# Patient Record
Sex: Male | Born: 1960 | Hispanic: No | Marital: Married | Smoking: Former smoker
Health system: Southern US, Community
[De-identification: ages and names within clinical notes are randomized; demographics above are authoritative.]

## PROBLEM LIST (undated history)

## (undated) DIAGNOSIS — E118 Type 2 diabetes mellitus with unspecified complications: Secondary | ICD-10-CM

## (undated) DIAGNOSIS — N289 Disorder of kidney and ureter, unspecified: Secondary | ICD-10-CM

## (undated) DIAGNOSIS — E785 Hyperlipidemia, unspecified: Secondary | ICD-10-CM

## (undated) DIAGNOSIS — Z8659 Personal history of other mental and behavioral disorders: Secondary | ICD-10-CM

## (undated) DIAGNOSIS — E119 Type 2 diabetes mellitus without complications: Secondary | ICD-10-CM

## (undated) DIAGNOSIS — K5090 Crohn's disease, unspecified, without complications: Secondary | ICD-10-CM

## (undated) DIAGNOSIS — Z85820 Personal history of malignant melanoma of skin: Secondary | ICD-10-CM

## (undated) DIAGNOSIS — I10 Essential (primary) hypertension: Secondary | ICD-10-CM

## (undated) DIAGNOSIS — Z85828 Personal history of other malignant neoplasm of skin: Secondary | ICD-10-CM

## (undated) DIAGNOSIS — M545 Low back pain, unspecified: Secondary | ICD-10-CM

## (undated) DIAGNOSIS — H409 Unspecified glaucoma: Secondary | ICD-10-CM

## (undated) DIAGNOSIS — M1990 Unspecified osteoarthritis, unspecified site: Secondary | ICD-10-CM

## (undated) DIAGNOSIS — C439 Malignant melanoma of skin, unspecified: Secondary | ICD-10-CM

## (undated) HISTORY — DX: Personal history of malignant melanoma of skin: Z85.820

## (undated) HISTORY — DX: Hyperlipidemia, unspecified: E78.5

## (undated) HISTORY — DX: Disorder of kidney and ureter, unspecified: N28.9

## (undated) HISTORY — DX: Type 2 diabetes mellitus without complications: E11.9

## (undated) HISTORY — PX: GLAUCOMA REPAIR: SHX214

## (undated) HISTORY — DX: Personal history of other mental and behavioral disorders: Z86.59

## (undated) HISTORY — DX: Personal history of other malignant neoplasm of skin: Z85.828

## (undated) HISTORY — PX: COLONOSCOPY: SHX174

## (undated) HISTORY — PX: RETINAL LASER PROCEDURE: SHX2339

## (undated) HISTORY — DX: Unspecified osteoarthritis, unspecified site: M19.90

## (undated) HISTORY — DX: Crohn's disease, unspecified, without complications: K50.90

## (undated) HISTORY — DX: Unspecified glaucoma: H40.9

## (undated) HISTORY — DX: Essential (primary) hypertension: I10

## (undated) HISTORY — DX: Type 2 diabetes mellitus with unspecified complications: E11.8

## (undated) HISTORY — DX: Low back pain, unspecified: M54.50

## (undated) HISTORY — DX: Malignant melanoma of skin, unspecified: C43.9

## (undated) HISTORY — PX: CATARACT EXTRACTION, BILATERAL: SHX1313

---
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
## 1997-12-11 HISTORY — PX: MELANOMA EXCISION: SHX5266

## 1997-12-11 HISTORY — PX: KNEE ARTHROSCOPY: SUR90

## 2012-10-22 ENCOUNTER — Other Ambulatory Visit (HOSPITAL_COMMUNITY): Payer: Self-pay | Admitting: Neurosurgery

## 2012-10-22 DIAGNOSIS — M542 Cervicalgia: Secondary | ICD-10-CM

## 2012-10-22 DIAGNOSIS — M47812 Spondylosis without myelopathy or radiculopathy, cervical region: Secondary | ICD-10-CM

## 2012-10-22 DIAGNOSIS — M5030 Other cervical disc degeneration, unspecified cervical region: Secondary | ICD-10-CM

## 2012-10-22 DIAGNOSIS — M5412 Radiculopathy, cervical region: Secondary | ICD-10-CM

## 2012-10-28 ENCOUNTER — Ambulatory Visit (HOSPITAL_COMMUNITY)
Admission: RE | Admit: 2012-10-28 | Discharge: 2012-10-28 | Disposition: A | Discharge status: Home / Self Care | Payer: 59 | Source: Ambulatory Visit | Attending: Neurosurgery | Admitting: Neurosurgery

## 2012-10-28 DIAGNOSIS — M542 Cervicalgia: Secondary | ICD-10-CM | POA: Insufficient documentation

## 2012-10-28 DIAGNOSIS — M47812 Spondylosis without myelopathy or radiculopathy, cervical region: Secondary | ICD-10-CM | POA: Insufficient documentation

## 2012-10-28 DIAGNOSIS — M5030 Other cervical disc degeneration, unspecified cervical region: Secondary | ICD-10-CM | POA: Insufficient documentation

## 2012-10-28 DIAGNOSIS — M5412 Radiculopathy, cervical region: Secondary | ICD-10-CM | POA: Insufficient documentation

## 2015-08-27 LAB — CBC AND DIFFERENTIAL
HEMATOCRIT: 39 % — AB (ref 41–53)
Hemoglobin: 13.8 g/dL (ref 13.5–17.5)
Platelets: 232 10*3/uL (ref 150–399)
WBC: 4.5 10^3/mL

## 2015-12-08 LAB — CBC AND DIFFERENTIAL
HCT: 28 % — AB (ref 41–53)
Hemoglobin: 9.7 g/dL — AB (ref 13.5–17.5)
Platelets: 334 10*3/uL (ref 150–399)
WBC: 4 10^3/mL

## 2016-04-20 LAB — HEMOGLOBIN A1C: Hemoglobin A1C: 7.5

## 2016-09-19 MED FILL — MERCAPTOPURINE 50 MG TABLET: 50 | 90 days supply | Qty: 90 | Fill #0

## 2016-09-19 MED FILL — AMLODIPINE BESYLATE 10 MG T: 10 | 90 days supply | Qty: 90 | Fill #0 | Status: TO

## 2016-09-19 MED FILL — PIOGLITAZONE HCL 30 MG TAB: 30 | 90 days supply | Qty: 90 | Fill #0

## 2016-09-19 MED FILL — metFORMIN HCL 500 MG TABS: 500 | 90 days supply | Qty: 180 | Fill #0 | Status: TO

## 2016-09-19 MED FILL — ALLOPURINOL 100 MG TABLET: 100 | 90 days supply | Qty: 90 | Fill #0

## 2016-09-19 MED FILL — BUDESONIDE EC 3 MG CAPSULE: 3 | 30 days supply | Qty: 90 | Fill #0

## 2016-09-19 MED FILL — VENLAFAXINE HCL ER 37.5 MG: 37.5 | 90 days supply | Qty: 90 | Fill #0 | Status: TO

## 2016-09-19 MED FILL — VALSARTAN-HCTZ 320-25 MG TA: 320-25 | 90 days supply | Qty: 90 | Fill #0 | Status: TO

## 2016-11-08 MED FILL — UNIFINE PENTIPS 32GX5/32: 32G X 4 MM | 90 days supply | Qty: 100 | Fill #0 | Status: TO

## 2016-11-08 MED FILL — UNIFINE PENTIPS 32GX5/32": 32G X 4 MM | 90 days supply | Qty: 100 | Fill #0 | Status: TO

## 2016-12-06 MED FILL — TOUJEO SOLOSTAR 300 UNITS/M: 300 | 90 days supply | Qty: 9 | Fill #0 | Status: TO

## 2016-12-21 MED FILL — ALLOPURINOL 100 MG TABLET: 100 | 90 days supply | Qty: 90 | Fill #1

## 2016-12-21 MED FILL — MERCAPTOPURINE 50 MG TABLET: 50 | 90 days supply | Qty: 90 | Fill #1

## 2016-12-21 MED FILL — BUDESONIDE EC 3 MG CAPSULE: 3 | 30 days supply | Qty: 90 | Fill #1

## 2016-12-29 ENCOUNTER — Encounter: Payer: Self-pay | Admitting: Family Medicine

## 2017-01-16 MED FILL — AMLODIPINE BESYLATE 10 MG T: 10 | 90 days supply | Qty: 90 | Fill #0

## 2017-01-16 MED FILL — VENLAFAXINE HCL ER 37.5 MG: 37.5 | 90 days supply | Qty: 90 | Fill #0

## 2017-01-16 MED FILL — VALSARTAN-HCTZ 320-25 MG TA: 320-25 | 90 days supply | Qty: 90 | Fill #0

## 2017-01-16 MED FILL — metFORMIN HCL 500 MG TABS: 500 | 90 days supply | Qty: 180 | Fill #0

## 2017-01-25 ENCOUNTER — Telehealth: Payer: Self-pay

## 2017-01-25 ENCOUNTER — Ambulatory Visit: Payer: 59 | Admitting: Family Medicine

## 2017-01-26 NOTE — Telephone Encounter (Signed)
Left message for patient to return call.

## 2017-01-29 ENCOUNTER — Encounter: Payer: Self-pay | Admitting: Family Medicine

## 2017-01-29 ENCOUNTER — Ambulatory Visit (INDEPENDENT_AMBULATORY_CARE_PROVIDER_SITE_OTHER): Payer: 59 | Admitting: Family Medicine

## 2017-01-29 VITALS — BP 114/77 | HR 92 | Temp 98.1°F | Ht 73.0 in | Wt 178.8 lb

## 2017-01-29 DIAGNOSIS — E118 Type 2 diabetes mellitus with unspecified complications: Secondary | ICD-10-CM | POA: Diagnosis not present

## 2017-01-29 DIAGNOSIS — Z85828 Personal history of other malignant neoplasm of skin: Secondary | ICD-10-CM

## 2017-01-29 DIAGNOSIS — Z794 Long term (current) use of insulin: Secondary | ICD-10-CM | POA: Insufficient documentation

## 2017-01-29 DIAGNOSIS — E782 Mixed hyperlipidemia: Secondary | ICD-10-CM | POA: Diagnosis not present

## 2017-01-29 DIAGNOSIS — K5090 Crohn's disease, unspecified, without complications: Secondary | ICD-10-CM

## 2017-01-29 DIAGNOSIS — I10 Essential (primary) hypertension: Secondary | ICD-10-CM | POA: Insufficient documentation

## 2017-01-29 DIAGNOSIS — R252 Cramp and spasm: Secondary | ICD-10-CM | POA: Insufficient documentation

## 2017-01-29 DIAGNOSIS — Z1159 Encounter for screening for other viral diseases: Secondary | ICD-10-CM

## 2017-01-29 DIAGNOSIS — K5080 Crohn's disease of both small and large intestine without complications: Secondary | ICD-10-CM | POA: Insufficient documentation

## 2017-01-29 DIAGNOSIS — Z5181 Encounter for therapeutic drug level monitoring: Secondary | ICD-10-CM

## 2017-01-29 DIAGNOSIS — Z125 Encounter for screening for malignant neoplasm of prostate: Secondary | ICD-10-CM

## 2017-01-29 HISTORY — DX: Crohn's disease, unspecified, without complications: K50.90

## 2017-01-29 LAB — HEMOGLOBIN A1C: HEMOGLOBIN A1C: 6.6 % — AB (ref 4.6–6.5)

## 2017-01-29 LAB — COMPREHENSIVE METABOLIC PANEL
ALBUMIN: 4.1 g/dL (ref 3.5–5.2)
ALT: 10 U/L (ref 0–53)
AST: 15 U/L (ref 0–37)
Alkaline Phosphatase: 33 U/L — ABNORMAL LOW (ref 39–117)
BILIRUBIN TOTAL: 0.7 mg/dL (ref 0.2–1.2)
BUN: 16 mg/dL (ref 6–23)
CALCIUM: 8.7 mg/dL (ref 8.4–10.5)
CHLORIDE: 103 meq/L (ref 96–112)
CO2: 32 meq/L (ref 19–32)
Creatinine, Ser: 0.98 mg/dL (ref 0.40–1.50)
GFR: 83.52 mL/min (ref >60.00)
Glucose, Bld: 153 mg/dL — ABNORMAL HIGH (ref 70–99)
Potassium: 3.9 mEq/L (ref 3.5–5.1)
Sodium: 141 mEq/L (ref 135–145)
Total Protein: 6.3 g/dL (ref 6.0–8.3)

## 2017-01-29 LAB — CBC
HEMATOCRIT: 31.5 % — AB (ref 39.0–52.0)
HEMOGLOBIN: 11.2 g/dL — AB (ref 13.0–17.0)
MCHC: 35.4 g/dL (ref 30.0–36.0)
PLATELETS: 211 10*3/uL (ref 150.0–400.0)
RBC: 2.74 Mil/uL — ABNORMAL LOW (ref 4.22–5.81)
RDW: 16.7 % — ABNORMAL HIGH (ref 11.5–15.5)
WBC: 3.1 10*3/uL — ABNORMAL LOW (ref 4.0–10.5)

## 2017-01-29 LAB — LIPID PANEL
CHOLESTEROL: 178 mg/dL (ref 0–200)
HDL: 38.1 mg/dL — AB (ref >39.00)
LDL Cholesterol: 116 mg/dL — ABNORMAL HIGH (ref 0–99)
NonHDL: 140.26
Total CHOL/HDL Ratio: 5
Triglycerides: 119 mg/dL (ref 0.0–149.0)
VLDL: 23.8 mg/dL (ref 0.0–40.0)

## 2017-01-29 LAB — PSA: PSA: 1.05 ng/mL (ref 0.10–4.00)

## 2017-01-29 MED ORDER — MERCAPTOPURINE 50 MG PO TABS: 50.0000 mg | ORAL_TABLET | Freq: Every day | ORAL | 3 refills | Status: DC

## 2017-01-29 MED ORDER — ALLOPURINOL 100 MG PO TABS: 100.0000 mg | ORAL_TABLET | Freq: Every day | ORAL | 3 refills | Status: DC

## 2017-01-29 MED ORDER — METFORMIN HCL 500 MG PO TABS: 500.0000 mg | ORAL_TABLET | Freq: Two times a day (BID) | ORAL | 3 refills | Status: DC

## 2017-01-29 MED ORDER — BUDESONIDE 3 MG PO CPEP: ORAL_CAPSULE | ORAL | 3 refills | Status: DC

## 2017-01-29 MED ORDER — VALSARTAN-HYDROCHLOROTHIAZIDE 320-25 MG PO TABS: 1.0000 | ORAL_TABLET | Freq: Every day | ORAL | 3 refills | Status: DC

## 2017-01-29 MED ORDER — AMLODIPINE-VALSARTAN-HCTZ 10-320-25 MG PO TABS: 1.0000 | ORAL_TABLET | Freq: Every day | ORAL | 3 refills | Status: DC

## 2017-01-29 MED ORDER — VENLAFAXINE HCL ER 37.5 MG PO CP24: 37.5000 mg | ORAL_CAPSULE | Freq: Every day | ORAL | 3 refills | Status: DC

## 2017-01-29 MED ORDER — EZETIMIBE 10 MG PO TABS: 10.0000 mg | ORAL_TABLET | Freq: Every day | ORAL | 3 refills | Status: DC

## 2017-01-29 MED ORDER — INSULIN GLARGINE 300 UNIT/ML ~~LOC~~ SOPN: 30.0000 [IU] | PEN_INJECTOR | Freq: Every day | SUBCUTANEOUS | 3 refills | Status: DC

## 2017-01-29 MED ORDER — AMLODIPINE BESYLATE 10 MG PO TABS: 10.0000 mg | ORAL_TABLET | Freq: Every day | ORAL | 3 refills | Status: DC

## 2017-01-29 MED ORDER — PIOGLITAZONE HCL 30 MG PO TABS: 30.0000 mg | ORAL_TABLET | Freq: Every day | ORAL | 3 refills | Status: DC

## 2017-01-29 MED FILL — EZETIMIBE 10 MG TABLET: 10 | 90 days supply | Qty: 90 | Fill #0

## 2017-01-29 NOTE — Patient Instructions (Signed)
It was a pleasure to meet you today- welcome back to [place]!   I will be in touch with your labs and we will re-request your records from [place] I have referred you to Dr. Fontaine No at [facility] derm. Let me know if you need any other referrals and we can plan our next visit based on your labs (probably 4-6 months to recheck diabetes)

## 2017-01-29 NOTE — Progress Notes (Signed)
Pre visit review using our clinic review tool, if applicable. No additional management support is needed unless otherwise documented below in the visit note. 

## 2017-01-29 NOTE — Progress Notes (Signed)
[place] at [facility]: Thomas Peterson [address] [place], [place] [address] [phone]  Date:  01/29/2017   Name:  Thomas Peterson   DOB:  August 02, 1961   MRN:  [id number]  PCP:  Thomas Blinks, MD    Chief Complaint: Establish Care (Pt is new to the area. Pt here to est care. )   History of Present Illness:  Thomas [place] is a 56 y.o. very pleasant male patient who presents with the following:  Recently moved here from [facility].  Moved here for his job.  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    He is missing [place]- but is happy to be back here doing a job he enjoys.    History of Crohn's disease- he will be seeing Thomas [facility] at [facility] for his GI service He does have DM- his A1c has been running approx 7%.  Most recent A1c about 6 months ago  Last cholesterol was approx 6 months ago as well He is out of zetia- out of this for a while, he forgot about this medication while in the move  May have been out of it for a couple of months or more so we expect his lipids will be up today  No breakfast yet today His Crohns' disease has been flared up over the last several months- he is stuck taking the budesonide really every day.  He is taking about 3 mg a day right now at the least.  His sx may be fatigue, abd pain, loss of appetite.  He does have a GI appt next month but does not remember the exact date He does have some diarrhea but this is not a main feature  He does have a history of leg cramps- these are better with magnesium.  He does not tolerate statins and as such is on zetia instead  He does have significant cervical spine disease- and he thinks also lower back diease- did have an MRI years ago per Thomas [place]- 2013  IMPRESSION: 1.  Chronic C5-C6 and C6-C7 disc degeneration with mild spinal stenosis and moderate to severe bilateral C6 and C7 foraminal stenosis.  No spinal cord signal abnormality.  2.   Chronic severe facet degeneration at C7-T1 plus uncovertebral hypertrophy and possible  superimposed left foraminal disc protrusion resulting in severe left and moderate right C8 foraminal stenosis.  He also has a history of skin cancer and does need to establish with derm locally- he has had melanoma, [place] and BCC  His is married, his wife is in excellent health.   Their daughter is at [facility]- she has raynaud's disease but is otherwise healthy  He enjoys scuba diving but does not get to go very often.    He has had a flu shot this year He is s/p hep C testing and was negative   We have requested his records from [place] again for himj  Patient Active Problem List   Diagnosis Date Noted  . Controlled type 2 diabetes mellitus with complication, with long-term current use of insulin ([place]) 01/29/2017  . Essential hypertension 01/29/2017  . Crohn's disease without complication ([place]) 67/89/3810  . Mixed hyperlipidemia 01/29/2017  . Leg cramps 01/29/2017  . Medication monitoring encounter 01/29/2017  . History of skin cancer 01/29/2017    Past Medical History:  Diagnosis Date  . Crohn's disease without complication ([place]) 1/75/1025    No past surgical history on file.  Social History  Substance Use Topics  . Smoking status: Not on file  . Smokeless tobacco: Not on file  . Alcohol use Not on file    No family history on file.  Allergies  Allergen Reactions  . Sulfa Antibiotics Anaphylaxis    "I got these purple spots everywhere"   . Flagyl [Metronidazole]     GI effects   . Statins Other (See Comments)    Leg cramps    Medication list has been reviewed and updated.  No current outpatient prescriptions on file prior to visit.   No current facility-administered medications on file prior to visit.     Review of Systems:  As per HPI- otherwise negative.   Physical Examination: Vitals:   01/29/17 0834  BP: 114/77  Pulse: 92  Temp: 98.1 F (36.7 C)   Vitals:    01/29/17 0834  Weight: 178 lb 12.8 oz (81.1 kg)  Height: 6' 1"  (1.854 m)   Body mass index is 23.59 kg/m. Ideal Body Weight: Weight in (lb) to have BMI = 25: 189.1  GEN: WDWN, NAD, Non-toxic, A & O x 3, normal weight, looks well HEENT: Atraumatic, Normocephalic. Neck supple. No masses, No LAD. Ears and Nose: No external deformity. CV: RRR, No M/G/R. No JVD. No thrill. No extra heart sounds. PULM: CTA B, no wheezes, crackles, rhonchi. No retractions. No resp. distress. No accessory muscle use. ABD: S, NT, ND EXTR: No c/c/e NEURO Normal gait.  PSYCH: Normally interactive. Conversant. Not depressed or anxious appearing.  Calm demeanor.    Assessment and Plan: Controlled type 2 diabetes mellitus with complication, with long-term current use of insulin ([place]) - Plan: Comprehensive metabolic panel, Hemoglobin A1c, metFORMIN (GLUCOPHAGE) 500 MG tablet, pioglitazone (ACTOS) 30 MG tablet, Insulin Glargine (TOUJEO SOLOSTAR) 300 UNIT/ML SOPN  Essential hypertension - Plan: Comprehensive metabolic panel, amLODipine (NORVASC) 10 MG tablet, valsartan-hydrochlorothiazide (DIOVAN-HCT) 320-25 MG tablet, DISCONTINUED: Amlodipine-Valsartan-HCTZ 10-320-25 MG TABS  Crohn's disease without complication, unspecified gastrointestinal tract location ([place]) - Plan: CBC, Comprehensive metabolic panel, allopurinol (ZYLOPRIM) 100 MG tablet, mercaptopurine (PURINETHOL) 50 MG tablet, venlafaxine XR (EFFEXOR-XR) 37.5 MG 24 hr capsule, budesonide (ENTOCORT EC) 3 MG 24 hr capsule  Mixed hyperlipidemia - Plan: Lipid panel, ezetimibe (ZETIA) 10 MG tablet  Leg cramps - Plan: Comprehensive metabolic panel  Medication monitoring encounter - Plan: Comprehensive metabolic panel  Screening for prostate cancer - Plan: PSA  Encounter for hepatitis C screening test for low risk patient  History of skin cancer - Plan: Ambulatory referral to Dermatology  Here today to establish care and go over his chronic heath issues as  above BP is well controlled, refilled his meds Check A1c, refiled DM meds Refilled his crohn's disease meds, he has referral to GI pending See patient instructions for more details.     mychart to pt: Your A1c looks fine.  Your total to HDL cholesterol ratio is not great, but as you have been off your zetia for a while I would just start back on this med and we can recheck in a few months Your PSA is normal- will await old labs for comparison Metabolic profile looks fine with the exception of high glucose  Your blood count is somewhat unusual- however this may be your baseline, have you ever been told that you were anemic or that your mean cell volume (average red cell size) was high? If this is all news to you I would recommend that we repeat your blood count and also run B12 and  folate levels for you soon (it is possible that you could be deficient in these vitamin due to your Crohn's disease). There is nothing acutely worrisome in your blood count, but we will want to follow-up.  I will hope to have your old records soon  Results for orders placed or performed in visit on 01/29/17  CBC  Result Value Ref Range   WBC 3.1 (L) 4.0 - 10.5 K/uL   RBC 2.74 (L) 4.22 - 5.81 Mil/uL   Platelets 211.0 150.0 - 400.0 K/uL   Hemoglobin 11.2 (L) 13.0 - 17.0 g/dL   HCT 31.5 (L) 39.0 - 52.0 %   MCV 115.3 Repeated and verified X2. (H) 78.0 - 100.0 fl   MCHC 35.4 30.0 - 36.0 g/dL   RDW 16.7 (H) 11.5 - 15.5 %  Comprehensive metabolic panel  Result Value Ref Range   Sodium 141 135 - 145 mEq/L   Potassium 3.9 3.5 - 5.1 mEq/L   Chloride 103 96 - 112 mEq/L   CO2 32 19 - 32 mEq/L   Glucose, Bld 153 (H) 70 - 99 mg/dL   BUN 16 6 - 23 mg/dL   Creatinine, Ser 0.98 0.40 - 1.50 mg/dL   Total Bilirubin 0.7 0.2 - 1.2 mg/dL   Alkaline Phosphatase 33 (L) 39 - 117 U/L   AST 15 0 - 37 U/L   ALT 10 0 - 53 U/L   Total Protein 6.3 6.0 - 8.3 g/dL   Albumin 4.1 3.5 - 5.2 g/dL   Calcium 8.7 8.4 - 10.5 mg/dL   GFR  83.52 >60.00 mL/min  PSA  Result Value Ref Range   PSA 1.05 0.10 - 4.00 ng/mL  Hemoglobin A1c  Result Value Ref Range   Hgb A1c MFr Bld 6.6 (H) 4.6 - 6.5 %  Lipid panel  Result Value Ref Range   Cholesterol 178 0 - 200 mg/dL   Triglycerides 119.0 0.0 - 149.0 mg/dL   HDL 38.10 (L) >39.00 mg/dL   VLDL 23.8 0.0 - 40.0 mg/dL   LDL Cholesterol 116 (H) 0 - 99 mg/dL   Total CHOL/HDL Ratio 5    NonHDL 140.26      Signed Thomas Blinks, MD

## 2017-02-04 ENCOUNTER — Encounter: Payer: Self-pay | Admitting: Family Medicine

## 2017-02-04 ENCOUNTER — Other Ambulatory Visit: Payer: Self-pay | Admitting: Family Medicine

## 2017-02-04 DIAGNOSIS — D7589 Other specified diseases of blood and blood-forming organs: Secondary | ICD-10-CM

## 2017-02-07 MED FILL — PIOGLITAZONE HCL 30 MG TAB: 30 | 90 days supply | Qty: 90 | Fill #0

## 2017-02-16 ENCOUNTER — Encounter: Payer: Self-pay | Admitting: Family Medicine

## 2017-02-16 NOTE — Progress Notes (Unsigned)
Pre visit review using our clinic review tool, if applicable. No additional management support is needed unless otherwise documented below in the visit note. 

## 2017-02-23 DIAGNOSIS — Z79899 Other long term (current) drug therapy: Secondary | ICD-10-CM | POA: Diagnosis not present

## 2017-02-23 DIAGNOSIS — K5080 Crohn's disease of both small and large intestine without complications: Secondary | ICD-10-CM | POA: Diagnosis not present

## 2017-02-23 DIAGNOSIS — R1032 Left lower quadrant pain: Secondary | ICD-10-CM | POA: Diagnosis not present

## 2017-02-23 DIAGNOSIS — M549 Dorsalgia, unspecified: Secondary | ICD-10-CM | POA: Diagnosis not present

## 2017-02-23 DIAGNOSIS — R531 Weakness: Secondary | ICD-10-CM | POA: Diagnosis not present

## 2017-02-23 DIAGNOSIS — R200 Anesthesia of skin: Secondary | ICD-10-CM | POA: Diagnosis not present

## 2017-02-26 LAB — VITAMIN D 25 HYDROXY (VIT D DEFICIENCY, FRACTURES): VIT D 25 HYDROXY: 36

## 2017-02-28 MED FILL — ULTICARE PEN NDL 4MM 32G: 32G X 4 MM | 90 days supply | Qty: 100 | Fill #0

## 2017-03-01 ENCOUNTER — Encounter: Payer: Self-pay | Admitting: Family Medicine

## 2017-03-06 ENCOUNTER — Encounter: Payer: Self-pay | Admitting: Family Medicine

## 2017-03-06 NOTE — Progress Notes (Unsigned)
HEPATITIS B SURFACE ANTIGEN : NON-REACTIVE  SED RATE: 40 MM/HR  IRON: 88 mcg/dL  FERRITIN: 173 NG/ML  TIBC: 288 mcg/dL

## 2017-03-21 MED FILL — MERCAPTOPURINE 50 MG TABLET: 50 | 90 days supply | Qty: 90 | Fill #0

## 2017-03-21 MED FILL — BUDESONIDE EC 3 MG CAPSULE: 3 | 60 days supply | Qty: 180 | Fill #0

## 2017-03-21 MED FILL — ALLOPURINOL 100 MG TABLET: 100 | 90 days supply | Qty: 90 | Fill #0

## 2017-03-21 MED FILL — TOUJEO SOLOSTAR 300 UNITS/M: 300 | 90 days supply | Qty: 9 | Fill #0

## 2017-04-30 MED FILL — VALSARTAN-HCTZ 320-25 MG TA: 320-25 | 90 days supply | Qty: 90 | Fill #0

## 2017-04-30 MED FILL — AMLODIPINE BESYLATE 10 MG T: 10 | 90 days supply | Qty: 90 | Fill #0

## 2017-04-30 MED FILL — PIOGLITAZONE HCL 30 MG TAB: 30 | 90 days supply | Qty: 90 | Fill #1

## 2017-04-30 MED FILL — VENLAFAXINE HCL ER 37.5 MG: 37.5 | 90 days supply | Qty: 90 | Fill #0

## 2017-04-30 MED FILL — metFORMIN HCL 500 MG TABS: 500 | 90 days supply | Qty: 180 | Fill #0

## 2017-05-29 DIAGNOSIS — Z85828 Personal history of other malignant neoplasm of skin: Secondary | ICD-10-CM | POA: Diagnosis not present

## 2017-05-29 DIAGNOSIS — D2271 Melanocytic nevi of right lower limb, including hip: Secondary | ICD-10-CM | POA: Diagnosis not present

## 2017-05-29 DIAGNOSIS — L738 Other specified follicular disorders: Secondary | ICD-10-CM | POA: Diagnosis not present

## 2017-05-29 DIAGNOSIS — L573 Poikiloderma of Civatte: Secondary | ICD-10-CM | POA: Diagnosis not present

## 2017-05-29 DIAGNOSIS — L814 Other melanin hyperpigmentation: Secondary | ICD-10-CM | POA: Diagnosis not present

## 2017-05-29 DIAGNOSIS — L821 Other seborrheic keratosis: Secondary | ICD-10-CM | POA: Diagnosis not present

## 2017-05-29 DIAGNOSIS — L570 Actinic keratosis: Secondary | ICD-10-CM | POA: Diagnosis not present

## 2017-05-29 DIAGNOSIS — Z85820 Personal history of malignant melanoma of skin: Secondary | ICD-10-CM | POA: Diagnosis not present

## 2017-05-29 DIAGNOSIS — D1801 Hemangioma of skin and subcutaneous tissue: Secondary | ICD-10-CM | POA: Diagnosis not present

## 2017-05-31 DIAGNOSIS — D72819 Decreased white blood cell count, unspecified: Secondary | ICD-10-CM | POA: Diagnosis not present

## 2017-05-31 DIAGNOSIS — Z79899 Other long term (current) drug therapy: Secondary | ICD-10-CM | POA: Diagnosis not present

## 2017-05-31 DIAGNOSIS — D649 Anemia, unspecified: Secondary | ICD-10-CM | POA: Diagnosis not present

## 2017-05-31 DIAGNOSIS — Z87891 Personal history of nicotine dependence: Secondary | ICD-10-CM | POA: Diagnosis not present

## 2017-05-31 DIAGNOSIS — Z888 Allergy status to other drugs, medicaments and biological substances status: Secondary | ICD-10-CM | POA: Diagnosis not present

## 2017-05-31 DIAGNOSIS — Z882 Allergy status to sulfonamides status: Secondary | ICD-10-CM | POA: Diagnosis not present

## 2017-05-31 DIAGNOSIS — Z289 Immunization not carried out for unspecified reason: Secondary | ICD-10-CM | POA: Diagnosis not present

## 2017-05-31 DIAGNOSIS — K589 Irritable bowel syndrome without diarrhea: Secondary | ICD-10-CM | POA: Diagnosis not present

## 2017-05-31 DIAGNOSIS — K5080 Crohn's disease of both small and large intestine without complications: Secondary | ICD-10-CM | POA: Diagnosis not present

## 2017-06-04 MED FILL — EZETIMIBE 10 MG TABLET: 10 | 90 days supply | Qty: 90 | Fill #1

## 2017-06-05 ENCOUNTER — Telehealth: Payer: Self-pay | Admitting: Pharmacist

## 2017-06-05 NOTE — Telephone Encounter (Signed)
Called patient to schedule an appointment for the [facility]. Provided information on the clinic and he agrees to participate in program. He is unsure of his schedule at this time and will call back for appt.

## 2017-06-06 MED FILL — FLUOROURACIL 5% CREAM: 5 | 14 days supply | Qty: 40 | Fill #0

## 2017-06-07 ENCOUNTER — Ambulatory Visit (HOSPITAL_BASED_OUTPATIENT_CLINIC_OR_DEPARTMENT_OTHER): Payer: Self-pay | Admitting: Pharmacist

## 2017-06-07 DIAGNOSIS — Z79899 Other long term (current) drug therapy: Secondary | ICD-10-CM

## 2017-06-07 MED ORDER — ADALIMUMAB 40 MG/0.8ML ~~LOC~~ AJKT: 40.0000 mg | AUTO-INJECTOR | SUBCUTANEOUS | 5 refills | Status: DC

## 2017-06-07 NOTE — Progress Notes (Signed)
   S: Patient presents to [facility] for review of their specialty medication therapy.  Patient is currently taking Humira for Crohn's disease. Patient is managed by Eduard Roux for this.   He has been on Remicade in the past but failed it possibly due to incorrect dosing. Just was on 6-MP and was failing.  Adherence: hasn't started yet  Efficacy: hasn't started yet  Dosing:  Crohn disease: SubQ (may continue aminosalicylates and/or corticosteroids; if necessary, azathioprine, mercaptopurine, or methotrexate may also be continued): Initial: 160 mg (given as four 40 mg injections on day 1 or given as two 40 mg injections per day over 2 consecutive days), then 80 mg 2 weeks later (day 15). Maintenance: 40 mg every other week beginning day 29. Note: Some patients may require 40 mg every week as maintenance therapy Karl Pock [id number]).   Dose adjustments: Renal: no dose adjustments (has not been studied) Hepatic: no dose adjustments (has not been studied)  Drug-drug interactions: none  Screening: TB test: completed per patient Hepatitis: completed per patient  Monitoring: S/sx of infection: denies CBC: see below, monitored q3 months S/sx of hypersensitivity: has not started yet S/sx of malignancy: denies S/sx of heart failure: denies  O:     Lab Results  Component Value Date   WBC 3.1 (L) 01/29/2017   HGB 11.2 (L) 01/29/2017   HCT 31.5 (L) 01/29/2017   MCV 115.3 Repeated and verified X2. (H) 01/29/2017   PLT 211.0 01/29/2017      Chemistry      Component Value Date/Time   NA 141 01/29/2017 0911   K 3.9 01/29/2017 0911   CL 103 01/29/2017 0911   CO2 32 01/29/2017 0911   BUN 16 01/29/2017 0911   CREATININE 0.98 01/29/2017 0911      Component Value Date/Time   CALCIUM 8.7 01/29/2017 0911   ALKPHOS 33 (L) 01/29/2017 0911   AST 15 01/29/2017 0911   ALT 10 01/29/2017 0911   BILITOT 0.7 01/29/2017 0911       A/P: 1. Medication  review: Patient currently on Humira for Crohn's disease. Reviewed the medication with the patient, including the following: Humira is a TNF blocking agent indicated for ankylosing spondylitis, Crohn's disease, Hidradenitis suppurativa, psoriatic arthritis, plaque psoriasis, ulcerative colitis, and uveitis. The most common adverse effects are infections, headache, and injection site reactions. There is the possibility of an increased risk of malignancy but it is not well understood if this increased risk is due to there medication or the disease state. There are rare cases of pancytopenia and aplastic anemia. No recommendations for any changes. Patient needs to continue to have q3 month CBCs for monitoring.   Nicoletta Ba, PharmD, BCPS, BCACP, [facility] [phone]

## 2017-06-11 ENCOUNTER — Other Ambulatory Visit: Payer: Self-pay | Admitting: Pharmacist

## 2017-06-11 MED ORDER — ADALIMUMAB 40 MG/0.8ML ~~LOC~~ AJKT: 160.0000 mg | AUTO-INJECTOR | Freq: Once | SUBCUTANEOUS | 0 refills | Status: DC

## 2017-06-12 ENCOUNTER — Other Ambulatory Visit: Payer: Self-pay | Admitting: Pharmacist

## 2017-06-12 MED ORDER — ADALIMUMAB 40 MG/0.8ML ~~LOC~~ AJKT: 160.0000 mg | AUTO-INJECTOR | Freq: Once | SUBCUTANEOUS | 0 refills | Status: DC

## 2017-06-12 MED FILL — HUMIRA PEN 40 MG/0.8ML PNKT: 40 | 28 days supply | Qty: 6 | Fill #0

## 2017-06-26 DIAGNOSIS — K5080 Crohn's disease of both small and large intestine without complications: Secondary | ICD-10-CM | POA: Diagnosis not present

## 2017-06-26 MED FILL — ULTICARE PEN NDL 4MM 32G: 32G X 4 MM | 90 days supply | Qty: 100 | Fill #1

## 2017-06-27 MED FILL — TOUJEO SOLOSTAR 300 UNITS/M: 300 | 90 days supply | Qty: 9 | Fill #0

## 2017-07-09 DIAGNOSIS — H35722 Serous detachment of retinal pigment epithelium, left eye: Secondary | ICD-10-CM | POA: Diagnosis not present

## 2017-07-09 DIAGNOSIS — H43812 Vitreous degeneration, left eye: Secondary | ICD-10-CM | POA: Diagnosis not present

## 2017-07-11 DIAGNOSIS — E119 Type 2 diabetes mellitus without complications: Secondary | ICD-10-CM | POA: Diagnosis not present

## 2017-07-11 DIAGNOSIS — H16223 Keratoconjunctivitis sicca, not specified as Sjogren's, bilateral: Secondary | ICD-10-CM | POA: Diagnosis not present

## 2017-07-11 DIAGNOSIS — H25013 Cortical age-related cataract, bilateral: Secondary | ICD-10-CM | POA: Diagnosis not present

## 2017-07-11 DIAGNOSIS — H5213 Myopia, bilateral: Secondary | ICD-10-CM | POA: Diagnosis not present

## 2017-07-11 MED FILL — HUMIRA PEN 40 MG/0.8ML PNKT: 40 | 28 days supply | Qty: 2 | Fill #0

## 2017-08-07 MED FILL — HUMIRA PEN 40 MG/0.8ML PNKT: 40 | 28 days supply | Qty: 2 | Fill #1

## 2017-08-07 MED FILL — metFORMIN HCL 500 MG TABS: 500 | 90 days supply | Qty: 180 | Fill #1

## 2017-08-07 MED FILL — PIOGLITAZONE HCL 30 MG TAB: 30 | 90 days supply | Qty: 90 | Fill #2

## 2017-08-16 ENCOUNTER — Encounter: Payer: Self-pay | Admitting: Family Medicine

## 2017-08-16 ENCOUNTER — Ambulatory Visit (INDEPENDENT_AMBULATORY_CARE_PROVIDER_SITE_OTHER): Payer: 59 | Admitting: Family Medicine

## 2017-08-16 VITALS — BP 130/84 | HR 88 | Temp 98.1°F | Ht 73.0 in | Wt 185.4 lb

## 2017-08-16 DIAGNOSIS — Z794 Long term (current) use of insulin: Secondary | ICD-10-CM

## 2017-08-16 DIAGNOSIS — I10 Essential (primary) hypertension: Secondary | ICD-10-CM

## 2017-08-16 DIAGNOSIS — K5090 Crohn's disease, unspecified, without complications: Secondary | ICD-10-CM | POA: Diagnosis not present

## 2017-08-16 DIAGNOSIS — Z23 Encounter for immunization: Secondary | ICD-10-CM

## 2017-08-16 DIAGNOSIS — R7989 Other specified abnormal findings of blood chemistry: Secondary | ICD-10-CM

## 2017-08-16 DIAGNOSIS — E118 Type 2 diabetes mellitus with unspecified complications: Secondary | ICD-10-CM

## 2017-08-16 DIAGNOSIS — R21 Rash and other nonspecific skin eruption: Secondary | ICD-10-CM | POA: Diagnosis not present

## 2017-08-16 LAB — BASIC METABOLIC PANEL
BUN: 17 mg/dL (ref 6–23)
CALCIUM: 9.3 mg/dL (ref 8.4–10.5)
CO2: 29 mEq/L (ref 19–32)
CREATININE: 1.04 mg/dL (ref 0.40–1.50)
Chloride: 101 mEq/L (ref 96–112)
GFR: 77.83 mL/min (ref >60.00)
Glucose, Bld: 104 mg/dL — ABNORMAL HIGH (ref 70–99)
Potassium: 3.8 mEq/L (ref 3.5–5.1)
SODIUM: 140 meq/L (ref 135–145)

## 2017-08-16 LAB — HEMOGLOBIN A1C: HEMOGLOBIN A1C: 6 % (ref 4.6–6.5)

## 2017-08-16 LAB — CBC
HCT: 42.2 % (ref 39.0–52.0)
Hemoglobin: 14.5 g/dL (ref 13.0–17.0)
MCHC: 34.3 g/dL (ref 30.0–36.0)
MCV: 105.1 fl — ABNORMAL HIGH (ref 78.0–100.0)
PLATELETS: 216 10*3/uL (ref 150.0–400.0)
RBC: 4.01 Mil/uL — AB (ref 4.22–5.81)
RDW: 14.1 % (ref 11.5–15.5)
WBC: 4.7 10*3/uL (ref 4.0–10.5)

## 2017-08-16 NOTE — Patient Instructions (Signed)
It was a pleasure to see you as always!  Take care and have a wonderful trip to [facility] I will be in touch with your labs Please let me know if any change or worsening of your rash, or if it does not resolve in the next couple of weeks

## 2017-08-16 NOTE — Progress Notes (Addendum)
[place] at [facility] [address] [place], [place] [address] [phone]  Date:  08/16/2017   Name:  Thomas Peterson   DOB:  01/21/1961   MRN:  [id number]  PCP:  Darreld Mclean, MD    Chief Complaint: Follow-up (Pt states that he's doing well today. Coming in for a follow up); Diabetes (Doesn't know how his range has been. Isn't checking his BG levels regularly); and Allergic Reaction (States he's taking zyrtec for an allergy "to something" not sure why or what. States he itches and gets "bumpy")   History of Present Illness:  Thomas Peterson is a 57 y.o. very pleasant male patient who presents with the following:  I last saw him in February for an office visit.  History of controlled DM, Crohn's disease, hyperlipidemia, HTN  Recently moved here from [facility].  Moved here for his job.  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    He is missing [place]- but is happy to be back here doing a job he enjoys.    History of Crohn's disease- he will be seeing Dr. Nevada Crane at [facility] for his GI service He does have DM- his A1c has been running approx 7%.  Most recent A1c about 6 months ago  Last cholesterol was approx 6 months ago as well He is out of zetia- out of this for a while, he forgot about this medication while in the move  May have been out of it for a couple of months or more so we expect his lipids will be up today  No breakfast yet today His Crohns' disease has been flared up over the last several months- he is stuck taking the budesonide really every day.  He is taking about 3 mg a day right now at the least.  His sx may be fatigue, abd pain, loss of appetite.  He does have a GI appt next month but does not remember the exact date He does have some diarrhea but this is not a main feature He does have a history of leg cramps- these are better with magnesium.  He does not tolerate statins and as such is  on zetia instead He does have significant cervical spine disease- and he thinks also lower back diease- did have an MRI years ago per Dr. Rita Ohara- 2013  He is seeing Dr. Nyoka Cowden at [facility]- they have started him on Humira as of the last couple of months and this is really working well for him, he is feeling a lot better He is due for an A1c, foot exam, eye exam Flu shot today  A couple of weeks ago he noted a rash on his trunk, shoulders, arms.  Quite itchy. Zyrtec does help- the rash seems to be nearly gone,but he is still taking zyrtec to keep it under control No oral lesions or SOB The rash appeared in between doses of Humira, and repeating his dose last week did not make him worse.  Does not seem to be related to Humira use  He is often out in the garden working but generally does not have any plant rashes.  They do have a cat now but he has never been allergic in the past  His GI doctor is following his CBC- they hope that this will improve since he is no longer on budesonide  He is taking 30 units of toujeo, actos and metformin No  issues with low sugars Recent A1c looked fine Repeat today  Lab Results  Component Value Date   HGBA1C 6.6 (H) 01/29/2017   Wt Readings from Last 3 Encounters:  08/16/17 185 lb 6.4 oz (84.1 kg)  01/29/17 178 lb 12.8 oz (81.1 kg)   BP Readings from Last 3 Encounters:  08/16/17 130/84  01/29/17 114/77      Patient Active Problem List   Diagnosis Date Noted  . Controlled type 2 diabetes mellitus with complication, with long-term current use of insulin ([place]) 01/29/2017  . Essential hypertension 01/29/2017  . Crohn's disease without complication ([place]) 97/41/6384  . Mixed hyperlipidemia 01/29/2017  . Leg cramps 01/29/2017  . Medication monitoring encounter 01/29/2017  . History of skin cancer 01/29/2017    Past Medical History:  Diagnosis Date  . Crohn disease ([place])   . Crohn's disease without complication ([place]) 5/36/4680  . Diabetes mellitus  without complication ([place])     History reviewed. No pertinent surgical history.  Social History  Substance Use Topics  . Smoking status: Never Smoker  . Smokeless tobacco: Never Used  . Alcohol use Not on file    History reviewed. No pertinent family history.  Allergies  Allergen Reactions  . Sulfa Antibiotics Anaphylaxis    "I got these purple spots everywhere"   . Ciprofloxacin   . Flagyl [Metronidazole]     GI effects   . Statins Other (See Comments)    Leg cramps    Medication list has been reviewed and updated.  Current Outpatient Prescriptions on File Prior to Visit  Medication Sig Dispense Refill  . Adalimumab (HUMIRA PEN) 40 MG/0.8ML PNKT Inject 40 mg into the skin every 14 (fourteen) days. 2 each 5  . Adalimumab (HUMIRA PEN) 40 MG/0.8ML PNKT Inject 160 mg into the skin once. On day 1. On day 15, administer 80 mg (2 x 40 mg pen injections) 6 each 0  . allopurinol (ZYLOPRIM) 100 MG tablet Take 1 tablet (100 mg total) by mouth daily. 90 tablet 3  . amLODipine (NORVASC) 10 MG tablet Take 1 tablet (10 mg total) by mouth daily. 90 tablet 3  . budesonide (ENTOCORT EC) 3 MG 24 hr capsule Take 1- 3 daily as needed for Crohn's symptoms 180 capsule 3  . ezetimibe (ZETIA) 10 MG tablet Take 1 tablet (10 mg total) by mouth daily. 90 tablet 3  . Insulin Glargine (TOUJEO SOLOSTAR) 300 UNIT/ML SOPN Inject 30 Units into the skin daily. 5 pen 3  . mercaptopurine (PURINETHOL) 50 MG tablet Take 1 tablet (50 mg total) by mouth daily. Give on an empty stomach 1 hour before or 2 hours after meals. Caution: Chemotherapy. 90 tablet 3  . metFORMIN (GLUCOPHAGE) 500 MG tablet Take 1 tablet (500 mg total) by mouth 2 (two) times daily with a meal. 180 tablet 3  . pioglitazone (ACTOS) 30 MG tablet Take 1 tablet (30 mg total) by mouth daily. 90 tablet 3  . valsartan-hydrochlorothiazide (DIOVAN-HCT) 320-25 MG tablet Take 1 tablet by mouth daily. 90 tablet 3  . venlafaxine XR (EFFEXOR-XR) 37.5 MG 24 hr  capsule Take 1 capsule (37.5 mg total) by mouth daily with breakfast. 90 capsule 3   No current facility-administered medications on file prior to visit.     Review of Systems:  As per HPI- otherwise negative.   Physical Examination: Vitals:   08/16/17 0858  BP: 130/84  Pulse: 88  Temp: 98.1 F (36.7 C)  SpO2: 98%   Vitals:   08/16/17 0858  Weight: 185 lb 6.4 oz (84.1 kg)  Height: 6' 1"  (1.854 m)   Body mass index is 24.46 kg/m. Ideal Body Weight: Weight in (lb) to have BMI = 25: 189.1  GEN: WDWN, NAD, Non-toxic, A & O x 3, looks well, normal weight HEENT: Atraumatic, Normocephalic. Neck supple. No masses, No LAD.  Bilateral TM wnl, oropharynx normal.  PEERL,EOMI.   Ears and Nose: No external deformity. CV: RRR, No M/G/R. No JVD. No thrill. No extra heart sounds. PULM: CTA B, no wheezes, crackles, rhonchi. No retractions. No resp. distress. No accessory muscle use. ABD: S, NT, ND. No rebound. No HSM. EXTR: No c/c/e NEURO Normal gait.  PSYCH: Normally interactive. Conversant. Not depressed or anxious appearing.  Calm demeanor.  No rash visible at this time  Assessment and Plan: Controlled type 2 diabetes mellitus with complication, with long-term current use of insulin ([place]) - Plan: Hemoglobin R4Y, Basic metabolic panel  Need for influenza vaccination - Plan: Flu Vaccine QUAD 6+ mos PF IM (Fluarix Quad PF)  Essential hypertension  Crohn's disease without complication, unspecified gastrointestinal tract location (HCC)  Rash  Abnormal CBC - Plan: CBC  Here today for a follow-up visit Labs pending as above A1c today Flu shot given Discussed his rash- uncertain etiology. It does not seem to be serious to him, it is controlled with zyrtec. He will let me know if this does not resolve  BP is controlled   Signed Lamar Blinks, MD  Received labs  Results for orders placed or performed in visit on 08/16/17  Hemoglobin A1c  Result Value Ref Range   Hgb A1c  MFr Bld 6.0 4.6 - 6.5 %  Basic metabolic panel  Result Value Ref Range   Sodium 140 135 - 145 mEq/L   Potassium 3.8 3.5 - 5.1 mEq/L   Chloride 101 96 - 112 mEq/L   CO2 29 19 - 32 mEq/L   Glucose, Bld 104 (H) 70 - 99 mg/dL   BUN 17 6 - 23 mg/dL   Creatinine, Ser 1.04 0.40 - 1.50 mg/dL   Calcium 9.3 8.4 - 10.5 mg/dL   GFR 77.83 >60.00 mL/min  CBC  Result Value Ref Range   WBC 4.7 4.0 - 10.5 K/uL   RBC 4.01 (L) 4.22 - 5.81 Mil/uL   Platelets 216.0 150.0 - 400.0 K/uL   Hemoglobin 14.5 13.0 - 17.0 g/dL   HCT 42.2 39.0 - 52.0 %   MCV 105.1 (H) 78.0 - 100.0 fl   MCHC 34.3 30.0 - 36.0 g/dL   RDW 14.1 11.5 - 15.5 %   Look good- DM under excellent control.  Blood count is more normal than at last check

## 2017-09-03 MED FILL — VENLAFAXINE HCL ER 37.5 MG: 37.5 | 90 days supply | Qty: 90 | Fill #1

## 2017-09-03 MED FILL — VALSARTAN-HCTZ 320-25 MG TA: 320-25 | 90 days supply | Qty: 90 | Fill #1

## 2017-09-03 MED FILL — HUMIRA PEN 40 MG/0.8ML PNKT: 40 | 28 days supply | Qty: 2 | Fill #2

## 2017-09-03 MED FILL — AMLODIPINE BESYLATE 10 MG T: 10 | 90 days supply | Qty: 90 | Fill #1

## 2017-09-27 DIAGNOSIS — Z87891 Personal history of nicotine dependence: Secondary | ICD-10-CM | POA: Diagnosis not present

## 2017-09-27 DIAGNOSIS — Z881 Allergy status to other antibiotic agents status: Secondary | ICD-10-CM | POA: Diagnosis not present

## 2017-09-27 DIAGNOSIS — D72819 Decreased white blood cell count, unspecified: Secondary | ICD-10-CM | POA: Diagnosis not present

## 2017-09-27 DIAGNOSIS — K5080 Crohn's disease of both small and large intestine without complications: Secondary | ICD-10-CM | POA: Diagnosis not present

## 2017-09-27 DIAGNOSIS — Z888 Allergy status to other drugs, medicaments and biological substances status: Secondary | ICD-10-CM | POA: Diagnosis not present

## 2017-09-27 DIAGNOSIS — K589 Irritable bowel syndrome without diarrhea: Secondary | ICD-10-CM | POA: Diagnosis not present

## 2017-09-27 DIAGNOSIS — Z883 Allergy status to other anti-infective agents status: Secondary | ICD-10-CM | POA: Diagnosis not present

## 2017-09-27 DIAGNOSIS — D649 Anemia, unspecified: Secondary | ICD-10-CM | POA: Diagnosis not present

## 2017-09-27 DIAGNOSIS — Z79899 Other long term (current) drug therapy: Secondary | ICD-10-CM | POA: Diagnosis not present

## 2017-09-27 DIAGNOSIS — Z882 Allergy status to sulfonamides status: Secondary | ICD-10-CM | POA: Diagnosis not present

## 2017-10-01 MED FILL — HUMIRA PEN 40 MG/0.8ML PNKT: 40 | 28 days supply | Qty: 2 | Fill #3

## 2017-10-01 MED FILL — ULTICARE PEN NDL 4MM 32G: 32G X 4 MM | 90 days supply | Qty: 100 | Fill #2

## 2017-10-01 MED FILL — TOUJEO SOLOSTAR 300 UNITS/M: 300 | 90 days supply | Qty: 9 | Fill #1

## 2017-10-02 DIAGNOSIS — L820 Inflamed seborrheic keratosis: Secondary | ICD-10-CM | POA: Diagnosis not present

## 2017-10-24 DIAGNOSIS — K5090 Crohn's disease, unspecified, without complications: Secondary | ICD-10-CM | POA: Diagnosis not present

## 2017-10-29 MED FILL — EZETIMIBE 10 MG TABLET: 10 | 90 days supply | Qty: 90 | Fill #2

## 2017-10-29 MED FILL — HUMIRA PEN 40 MG/0.8ML PNKT: 40 | 28 days supply | Qty: 2 | Fill #4

## 2017-11-27 MED FILL — metFORMIN HCL 500 MG TABS: 500 | 90 days supply | Qty: 180 | Fill #2

## 2017-11-27 MED FILL — PIOGLITAZONE HCL 30 MG TAB: 30 | 90 days supply | Qty: 90 | Fill #3

## 2017-11-28 MED FILL — HUMIRA PEN 40 MG/0.8ML PNKT: 40 | 28 days supply | Qty: 2 | Fill #5

## 2017-12-25 ENCOUNTER — Other Ambulatory Visit: Payer: Self-pay | Admitting: Family Medicine

## 2017-12-25 DIAGNOSIS — E118 Type 2 diabetes mellitus with unspecified complications: Secondary | ICD-10-CM

## 2017-12-25 DIAGNOSIS — Z794 Long term (current) use of insulin: Secondary | ICD-10-CM

## 2017-12-25 MED FILL — VENLAFAXINE HCL ER 37.5 MG: 37.5 | 90 days supply | Qty: 90 | Fill #2

## 2017-12-25 MED FILL — AMLODIPINE BESYLATE 10 MG T: 10 | 90 days supply | Qty: 90 | Fill #2

## 2017-12-25 MED FILL — VALSARTAN-HCTZ 320-25 MG TA: 320-25 | 90 days supply | Qty: 90 | Fill #2

## 2017-12-27 ENCOUNTER — Other Ambulatory Visit: Payer: Self-pay | Admitting: Pharmacist

## 2017-12-27 MED ORDER — ADALIMUMAB 40 MG/0.8ML ~~LOC~~ AJKT: 40.0000 mg | AUTO-INJECTOR | SUBCUTANEOUS | 5 refills | Status: DC

## 2017-12-27 MED FILL — HUMIRA PEN 40 MG/0.8ML PNKT: 40 | 28 days supply | Qty: 2 | Fill #0

## 2017-12-27 MED FILL — TOUJEO SOLOSTAR 300 UNITS/M: 300 | 45 days supply | Qty: 5 | Fill #0

## 2018-01-02 DIAGNOSIS — K5080 Crohn's disease of both small and large intestine without complications: Secondary | ICD-10-CM | POA: Diagnosis not present

## 2018-01-21 ENCOUNTER — Encounter: Payer: Self-pay | Admitting: Family Medicine

## 2018-01-21 MED ORDER — PEN NEEDLES 32G X 6 MM MISC: 1.0000 | Freq: Every day | 99 refills | Status: DC

## 2018-01-21 MED FILL — TECHLITE PEN NDL 32GX1/4: 32G X 6 MM | 90 days supply | Qty: 100 | Fill #0

## 2018-01-21 MED FILL — HUMIRA PEN 40 MG/0.8ML PNKT: 40 | 28 days supply | Qty: 2 | Fill #1

## 2018-01-21 MED FILL — TECHLITE PEN NDL 32GX1/4": 32G X 6 MM | 90 days supply | Qty: 100 | Fill #0

## 2018-02-18 ENCOUNTER — Other Ambulatory Visit: Payer: Self-pay | Admitting: Family Medicine

## 2018-02-18 DIAGNOSIS — E782 Mixed hyperlipidemia: Secondary | ICD-10-CM

## 2018-02-18 MED FILL — HUMIRA PEN 40 MG/0.8ML PNKT: 40 | 28 days supply | Qty: 2 | Fill #2

## 2018-02-20 MED FILL — EZETIMIBE 10 MG TABS: 10 | 90 days supply | Qty: 90 | Fill #0

## 2018-03-01 DIAGNOSIS — H43813 Vitreous degeneration, bilateral: Secondary | ICD-10-CM | POA: Diagnosis not present

## 2018-03-11 ENCOUNTER — Other Ambulatory Visit: Payer: Self-pay | Admitting: Family Medicine

## 2018-03-11 DIAGNOSIS — Z794 Long term (current) use of insulin: Secondary | ICD-10-CM

## 2018-03-11 DIAGNOSIS — I10 Essential (primary) hypertension: Secondary | ICD-10-CM

## 2018-03-11 DIAGNOSIS — E118 Type 2 diabetes mellitus with unspecified complications: Secondary | ICD-10-CM

## 2018-03-11 MED FILL — TOUJEO SOLOSTAR 300 UNITS/M: 300 | 45 days supply | Qty: 5 | Fill #1

## 2018-03-11 MED FILL — HUMIRA PEN 40 MG/0.8ML PNKT: 40 | 28 days supply | Qty: 2 | Fill #3

## 2018-03-13 MED FILL — AMLODIPINE BESYLATE 10 MG T: 10 | 90 days supply | Qty: 90 | Fill #0

## 2018-03-13 MED FILL — metFORMIN HCL 500 MG TABS: 500 | 90 days supply | Qty: 180 | Fill #0

## 2018-03-13 MED FILL — PIOGLITAZONE HCL 30 MG TAB: 30 | 90 days supply | Qty: 90 | Fill #0

## 2018-03-13 MED FILL — VALSARTAN-HCTZ 320-25 MG TA: 320-25 | 90 days supply | Qty: 90 | Fill #0

## 2018-04-02 DIAGNOSIS — H5213 Myopia, bilateral: Secondary | ICD-10-CM | POA: Diagnosis not present

## 2018-04-02 DIAGNOSIS — H43813 Vitreous degeneration, bilateral: Secondary | ICD-10-CM | POA: Diagnosis not present

## 2018-04-02 DIAGNOSIS — H43812 Vitreous degeneration, left eye: Secondary | ICD-10-CM | POA: Diagnosis not present

## 2018-04-02 DIAGNOSIS — H3530 Unspecified macular degeneration: Secondary | ICD-10-CM | POA: Diagnosis not present

## 2018-04-09 ENCOUNTER — Other Ambulatory Visit: Payer: Self-pay | Admitting: Family Medicine

## 2018-04-09 DIAGNOSIS — K5090 Crohn's disease, unspecified, without complications: Secondary | ICD-10-CM

## 2018-04-09 MED FILL — HUMIRA PEN 40 MG/0.8ML PNKT: 40 | 28 days supply | Qty: 2 | Fill #4

## 2018-04-11 MED FILL — BUDESONIDE 3 MG CPEP: 3 | 60 days supply | Qty: 180 | Fill #0

## 2018-04-29 MED FILL — TECHLITE PEN NDL 32GX1/4: 32G X 6 MM | 90 days supply | Qty: 100 | Fill #1

## 2018-04-29 MED FILL — TOUJEO SOLOSTAR 300 UNITS/M: 300 | 45 days supply | Qty: 5 | Fill #2

## 2018-04-29 MED FILL — TECHLITE PEN NDL 32GX1/4": 32G X 6 MM | 90 days supply | Qty: 100 | Fill #1

## 2018-05-10 MED FILL — HUMIRA PEN 40 MG/0.8ML PNKT: 40 | 28 days supply | Qty: 2 | Fill #5

## 2018-05-12 NOTE — Progress Notes (Signed)
[place] at [facility] [address], [place], [place] [address] [phone]  Date:  05/15/2018   Name:  Thomas Peterson   DOB:  12-08-1961   MRN:  [id number]  PCP:  Darreld Mclean, MD    Chief Complaint: Annual Exam and URI (coughing, congestion, head pressure, 1 week, worsening)   History of Present Illness:  Thomas Peterson is a 57 y.o. very pleasant male patient who presents with the following:  CPE today History of DM, HTN, Crohn's disease, hyperlipidemia Last seen here in 9/18:  Recently moved here from [facility]. Moved here for his job. 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  He is missing [place]- but is happy to be back here doing a job he enjoys.  History of Crohn's disease- He does have DM- his A1c has been running approx 7%. Most recent A1c about 6 months ago  His Crohns' disease has been flared up over the last several months- he is stuck taking the budesonide really every day. He is taking about 3 mg a day right now at the least. His sx may be fatigue, abd pain, loss of appetite. He does have a GI appt next month but does not remember the exact date He does have some diarrhea but this is not a main feature He does have a history of leg cramps- these are better with magnesium. He does not tolerate statins and as such is on zetia instead He does have significant cervical spine disease- and he thinks also lower back diease- did have an MRI years ago per Dr. Rita Ohara- 2013 He is seeing Dr. Nyoka Cowden at [place]- they have started him on Humira as of the last couple of months and this is really working well for him, he is feeling a lot better  A couple of weeks ago he noted a rash on his trunk, shoulders, arms.  Quite itchy. Zyrtec does help- the rash seems to be nearly gone,but he is still taking zyrtec to keep it under control No oral lesions or SOB The rash appeared in between doses of Humira, and  repeating his dose last week did not make him worse.  Does not seem to be related to Humira use He is often out in the garden working but generally does not have any plant rashes.  They do have a cat now but he has never been allergic in the past His GI doctor is following his CBC- they hope that this will improve since he is no longer on budesonide He is taking 30 units of toujeo, actos and metformin No issues with low sugars  Lab Results  Component Value Date   HGBA1C 6.0 08/16/2017   Foot exam: today Eye exam: done within the last couple of months per [facility] Colon: UTD Immum: UTD  He is also sick today- he had a head cold, then a chest cold, then back to his head He has been sick for a week-  Sinus and head pain, ST, cough that can be productive He has not noted any definite fever although he has felt hot and cold His stomach is a bit upset from the meds he is taking, but his Crohn's sx are not bad right now   He is on Humira which he will take next week. He feels like this helps with his Crohn's but he tends to get sick a lot while taking it He is using  budesonide every 3rd day more or less to control his colon sx  He is fasting this am for labs   BP: amlodipine, diovan hct Cholesterol: zetia DM: metformin, toujeo approx 30u but it varies some, actos Crohn's disease: humira, budesonide PR  He has noted left SI joint pain for some years- worst when he is laying down in bed. Admits that he did see a pt with metastatic melanoma to bone not too long ago which worried him some.  He would like to see someone about this issue and perhaps pursue an injection  BP Readings from Last 3 Encounters:  05/15/18 118/78  08/16/17 130/84  01/29/17 114/77    Patient Active Problem List   Diagnosis Date Noted  . Controlled type 2 diabetes mellitus with complication, with long-term current use of insulin ([place]) 01/29/2017  . Essential hypertension 01/29/2017  . Crohn's disease without  complication ([place]) 93/79/0240  . Mixed hyperlipidemia 01/29/2017  . Leg cramps 01/29/2017  . Medication monitoring encounter 01/29/2017  . History of skin cancer 01/29/2017    Past Medical History:  Diagnosis Date  . Crohn disease ([place])   . Crohn's disease without complication ([place]) 9/73/5329  . Diabetes mellitus without complication ([place])     History reviewed. No pertinent surgical history.  Social History   Tobacco Use  . Smoking status: Never Smoker  . Smokeless tobacco: Never Used  Substance Use Topics  . Alcohol use: Not on file  . Drug use: Not on file    History reviewed. No pertinent family history.  Allergies  Allergen Reactions  . Sulfa Antibiotics Anaphylaxis    "I got these purple spots everywhere"   . Ciprofloxacin   . Flagyl [Metronidazole]     GI effects   . Statins Other (See Comments)    Leg cramps    Medication list has been reviewed and updated.  Current Outpatient Medications on File Prior to Visit  Medication Sig Dispense Refill  . Adalimumab (HUMIRA PEN) 40 MG/0.8ML PNKT Inject 40 mg into the skin every 14 (fourteen) days. 2 each 5  . amLODipine (NORVASC) 10 MG tablet TAKE 1 TABLET BY MOUTH DAILY 90 tablet 3  . budesonide (ENTOCORT EC) 3 MG 24 hr capsule TAKE 1 TO 3 CAPSULES BY MOUTH DAILY AS NEEDED FOR CROHN'S SYMPTOMS 180 capsule 3  . ezetimibe (ZETIA) 10 MG tablet TAKE 1 TABLET (10 MG TOTAL) BY MOUTH DAILY. 90 tablet 3  . Insulin Pen Needle (PEN NEEDLES) 32G X 6 MM MISC 1 Device by Does not apply route daily. 100 each prn  . metFORMIN (GLUCOPHAGE) 500 MG tablet TAKE 1 TABLET BY MOUTH TWICE DAILY WITH A MEAL 180 tablet 3  . pioglitazone (ACTOS) 30 MG tablet TAKE 1 TABLET BY MOUTH DAILY 90 tablet 3  . valsartan-hydrochlorothiazide (DIOVAN-HCT) 320-25 MG tablet TAKE 1 TABLET BY MOUTH DAILY 90 tablet 3   No current facility-administered medications on file prior to visit.     Review of Systems:  As per HPI- otherwise negative. No CP or  SOB He has noted some early sx of neuropathy in his left foot mostly, a bit in his right foot No GU sx except he does have to get up and urinate a couple of times per night now    Physical Examination: Vitals:   05/15/18 0911  BP: 118/78  Pulse: 93  Resp: 16  Temp: 98.1 F (36.7 C)  SpO2: 94%   Vitals:   05/15/18 0911  Weight: 186 lb 9.6 oz (  84.6 kg)  Height: 6' 1"  (1.854 m)   Body mass index is 24.62 kg/m. Ideal Body Weight: Weight in (lb) to have BMI = 25: 189.1  GEN: WDWN, NAD, Non-toxic, A & O x 3, normal weight, looks well  HEENT: Atraumatic, Normocephalic. Neck supple. No masses, No LAD.  Irrigated right ear - TM appeared normal after irrigation Ears and Nose: No external deformity. CV: RRR, No M/G/R. No JVD. No thrill. No extra heart sounds. PULM: CTA B, no wheezes, crackles, rhonchi. No retractions. No resp. distress. No accessory muscle use. ABD: S, NT, ND EXTR: No c/c/e NEURO Normal gait.  PSYCH: Normally interactive. Conversant. Not depressed or anxious appearing.  Calm demeanor.  Foot exam ok today   Assessment and Plan: Physical exam  Controlled type 2 diabetes mellitus with complication, with long-term current use of insulin ([place]) - Plan: Hemoglobin A1c, Insulin Glargine (TOUJEO SOLOSTAR) 300 UNIT/ML SOPN  Essential hypertension  Crohn's disease without complication, unspecified gastrointestinal tract location (HCC)  Mixed hyperlipidemia - Plan: Lipid panel  Screening for prostate cancer - Plan: PSA  Medication monitoring encounter - Plan: CBC, Comprehensive metabolic panel  Chronic frontal sinusitis - Plan: doxycycline (VIBRAMYCIN) 100 MG capsule  Chronic left SI joint pain - Plan: Ambulatory referral to Sports Medicine  CPE today Labs pending as above I was able to get him a derm appt for next week and sent him a message with info Referral to sports med for his SI joint pain Treat for sinus infection with doxycycline Will plan further follow-  up pending labs.   Signed Lamar Blinks, MD  Received his labs as below, message to pt Results for orders placed or performed in visit on 05/15/18  CBC  Result Value Ref Range   WBC 5.9 4.0 - 10.5 K/uL   RBC 4.73 4.22 - 5.81 Mil/uL   Platelets 286.0 150.0 - 400.0 K/uL   Hemoglobin 15.1 13.0 - 17.0 g/dL   HCT 43.6 39.0 - 52.0 %   MCV 92.3 78.0 - 100.0 fl   MCHC 34.6 30.0 - 36.0 g/dL   RDW 13.7 11.5 - 15.5 %  Comprehensive metabolic panel  Result Value Ref Range   Sodium 140 135 - 145 mEq/L   Potassium 4.3 3.5 - 5.1 mEq/L   Chloride 100 96 - 112 mEq/L   CO2 32 19 - 32 mEq/L   Glucose, Bld 135 (H) 70 - 99 mg/dL   BUN 18 6 - 23 mg/dL   Creatinine, Ser 1.19 0.40 - 1.50 mg/dL   Total Bilirubin 0.4 0.2 - 1.2 mg/dL   Alkaline Phosphatase 44 39 - 117 U/L   AST 17 0 - 37 U/L   ALT 11 0 - 53 U/L   Total Protein 6.9 6.0 - 8.3 g/dL   Albumin 4.2 3.5 - 5.2 g/dL   Calcium 9.4 8.4 - 10.5 mg/dL   GFR 66.45 >60.00 mL/min  Hemoglobin A1c  Result Value Ref Range   Hgb A1c MFr Bld 7.4 (H) 4.6 - 6.5 %  Lipid panel  Result Value Ref Range   Cholesterol 181 0 - 200 mg/dL   Triglycerides 198.0 (H) 0.0 - 149.0 mg/dL   HDL 36.20 (L) >39.00 mg/dL   VLDL 39.6 0.0 - 40.0 mg/dL   LDL Cholesterol 106 (H) 0 - 99 mg/dL   Total CHOL/HDL Ratio 5    NonHDL 145.14   PSA  Result Value Ref Range   PSA 1.43 0.10 - 4.00 ng/mL    Your A1c has  gone up a bit- would you be ok with increasing your metformin dose? I wonder if increasing your dose to 1000 mg twice a day would get your A1c back under 7 without having to increase insulin  Blood counts are normal Metabolic profile is normal Total cholesterol and LDL look good; however your HDL is low, giving you a less favorable overall ratio.  Are you taking an omega 3 supplement?    Omega 3 may help to boost your HDL  Lab Results      Component                Value               Date                      PSA                      1.43                 05/15/2018                PSA                      1.05                01/29/2017           Your absolute PSA is ok, but you have gone up about 0.4 since last check. I would like to repeat a PSA in 2-3 months (lab visit only) to make sure it comes back to normal.  Please refrain from any sexual activity for 3-4 days prior to your test to avoid false elevation  Let me know your thoughts about the things I mentioned above, and let's plan to visit in 6 months

## 2018-05-15 ENCOUNTER — Encounter: Payer: Self-pay | Admitting: Family Medicine

## 2018-05-15 ENCOUNTER — Ambulatory Visit (INDEPENDENT_AMBULATORY_CARE_PROVIDER_SITE_OTHER): Payer: 59 | Admitting: Family Medicine

## 2018-05-15 VITALS — BP 118/78 | HR 93 | Temp 98.1°F | Resp 16 | Ht 73.0 in | Wt 186.6 lb

## 2018-05-15 DIAGNOSIS — Z5181 Encounter for therapeutic drug level monitoring: Secondary | ICD-10-CM

## 2018-05-15 DIAGNOSIS — Z794 Long term (current) use of insulin: Secondary | ICD-10-CM | POA: Diagnosis not present

## 2018-05-15 DIAGNOSIS — I10 Essential (primary) hypertension: Secondary | ICD-10-CM

## 2018-05-15 DIAGNOSIS — J321 Chronic frontal sinusitis: Secondary | ICD-10-CM | POA: Diagnosis not present

## 2018-05-15 DIAGNOSIS — R972 Elevated prostate specific antigen [PSA]: Secondary | ICD-10-CM | POA: Diagnosis not present

## 2018-05-15 DIAGNOSIS — E118 Type 2 diabetes mellitus with unspecified complications: Secondary | ICD-10-CM

## 2018-05-15 DIAGNOSIS — M533 Sacrococcygeal disorders, not elsewhere classified: Secondary | ICD-10-CM | POA: Diagnosis not present

## 2018-05-15 DIAGNOSIS — G8929 Other chronic pain: Secondary | ICD-10-CM | POA: Diagnosis not present

## 2018-05-15 DIAGNOSIS — E782 Mixed hyperlipidemia: Secondary | ICD-10-CM | POA: Diagnosis not present

## 2018-05-15 DIAGNOSIS — K5090 Crohn's disease, unspecified, without complications: Secondary | ICD-10-CM | POA: Diagnosis not present

## 2018-05-15 DIAGNOSIS — Z125 Encounter for screening for malignant neoplasm of prostate: Secondary | ICD-10-CM

## 2018-05-15 DIAGNOSIS — Z0000 Encounter for general adult medical examination without abnormal findings: Secondary | ICD-10-CM | POA: Diagnosis not present

## 2018-05-15 LAB — COMPREHENSIVE METABOLIC PANEL
ALBUMIN: 4.2 g/dL (ref 3.5–5.2)
ALK PHOS: 44 U/L (ref 39–117)
ALT: 11 U/L (ref 0–53)
AST: 17 U/L (ref 0–37)
BUN: 18 mg/dL (ref 6–23)
CALCIUM: 9.4 mg/dL (ref 8.4–10.5)
CO2: 32 mEq/L (ref 19–32)
Chloride: 100 mEq/L (ref 96–112)
Creatinine, Ser: 1.19 mg/dL (ref 0.40–1.50)
GFR: 66.45 mL/min (ref >60.00)
Glucose, Bld: 135 mg/dL — ABNORMAL HIGH (ref 70–99)
POTASSIUM: 4.3 meq/L (ref 3.5–5.1)
Sodium: 140 mEq/L (ref 135–145)
TOTAL PROTEIN: 6.9 g/dL (ref 6.0–8.3)
Total Bilirubin: 0.4 mg/dL (ref 0.2–1.2)

## 2018-05-15 LAB — LIPID PANEL
CHOLESTEROL: 181 mg/dL (ref 0–200)
HDL: 36.2 mg/dL — AB (ref >39.00)
LDL Cholesterol: 106 mg/dL — ABNORMAL HIGH (ref 0–99)
NonHDL: 145.14
Total CHOL/HDL Ratio: 5
Triglycerides: 198 mg/dL — ABNORMAL HIGH (ref 0.0–149.0)
VLDL: 39.6 mg/dL (ref 0.0–40.0)

## 2018-05-15 LAB — HEMOGLOBIN A1C: HEMOGLOBIN A1C: 7.4 % — AB (ref 4.6–6.5)

## 2018-05-15 LAB — CBC
HCT: 43.6 % (ref 39.0–52.0)
Hemoglobin: 15.1 g/dL (ref 13.0–17.0)
MCHC: 34.6 g/dL (ref 30.0–36.0)
MCV: 92.3 fl (ref 78.0–100.0)
PLATELETS: 286 10*3/uL (ref 150.0–400.0)
RBC: 4.73 Mil/uL (ref 4.22–5.81)
RDW: 13.7 % (ref 11.5–15.5)
WBC: 5.9 10*3/uL (ref 4.0–10.5)

## 2018-05-15 LAB — PSA: PSA: 1.43 ng/mL (ref 0.10–4.00)

## 2018-05-15 MED ORDER — DOXYCYCLINE HYCLATE 100 MG PO CAPS: 100.0000 mg | ORAL_CAPSULE | Freq: Two times a day (BID) | ORAL | 0 refills | Status: DC

## 2018-05-15 MED ORDER — INSULIN GLARGINE 300 UNIT/ML ~~LOC~~ SOPN: 30.0000 [IU] | PEN_INJECTOR | Freq: Every day | SUBCUTANEOUS | 3 refills | Status: DC

## 2018-05-15 MED FILL — DOXYCYCLINE HYCLATE 100 MG: 100 | 10 days supply | Qty: 20 | Fill #0

## 2018-05-15 NOTE — Patient Instructions (Signed)
Good to see you today- take care and  I will be in touch with your labs asap I will touch base with [facility] about your follow-up   Health Maintenance, Male A healthy lifestyle and preventive care is important for your health and wellness. Ask your health care provider about what schedule of regular examinations is right for you. What should I know about weight and diet? Eat a Healthy Diet  Eat plenty of vegetables, fruits, whole grains, low-fat dairy products, and lean protein.  Do not eat a lot of foods high in solid fats, added sugars, or salt.  Maintain a Healthy Weight Regular exercise can help you achieve or maintain a healthy weight. You should:  Do at least 150 minutes of exercise each week. The exercise should increase your heart rate and make you sweat (moderate-intensity exercise).  Do strength-training exercises at least twice a week.  Watch Your Levels of Cholesterol and Blood Lipids  Have your blood tested for lipids and cholesterol every 5 years starting at 59 years of age. If you are at high risk for heart disease, you should start having your blood tested when you are 59 years old. You may need to have your cholesterol levels checked more often if: ? Your lipid or cholesterol levels are high. ? You are older than 59 years of age. ? You are at high risk for heart disease.  What should I know about cancer screening? Many types of cancers can be detected early and may often be prevented. Lung Cancer  You should be screened every year for lung cancer if: ? You are a current smoker who has smoked for at least 30 years. ? You are a former smoker who has quit within the past 15 years.  Talk to your health care provider about your screening options, when you should start screening, and how often you should be screened.  Colorectal Cancer  Routine colorectal cancer screening usually begins at 59 years of age and should be repeated every 5-10 years until you are  59 years old. You may need to be screened more often if early forms of precancerous polyps or small growths are found. Your health care provider may recommend screening at an earlier age if you have risk factors for colon cancer.  Your health care provider may recommend using home test kits to check for hidden blood in the stool.  A small camera at the end of a tube can be used to examine your colon (sigmoidoscopy or colonoscopy). This checks for the earliest forms of colorectal cancer.  Prostate and Testicular Cancer  Depending on your age and overall health, your health care provider may do certain tests to screen for prostate and testicular cancer.  Talk to your health care provider about any symptoms or concerns you have about testicular or prostate cancer.  Skin Cancer  Check your skin from head to toe regularly.  Tell your health care provider about any new moles or changes in moles, especially if: ? There is a change in a mole's size, shape, or color. ? You have a mole that is larger than a pencil eraser.  Always use sunscreen. Apply sunscreen liberally and repeat throughout the day.  Protect yourself by wearing long sleeves, pants, a wide-brimmed hat, and sunglasses when outside.  What should I know about heart disease, diabetes, and high blood pressure?  If you are 30-60 years of age, have your blood pressure checked every 3-5 years. If you are 40 years  of age or older, have your blood pressure checked every year. You should have your blood pressure measured twice-once when you are at a hospital or clinic, and once when you are not at a hospital or clinic. Record the average of the two measurements. To check your blood pressure when you are not at a hospital or clinic, you can use: ? An automated blood pressure machine at a pharmacy. ? A home blood pressure monitor.  Talk to your health care provider about your target blood pressure.  If you are between 52-61 years old, ask  your health care provider if you should take aspirin to prevent heart disease.  Have regular diabetes screenings by checking your fasting blood sugar level. ? If you are at a normal weight and have a low risk for diabetes, have this test once every three years after the age of 11. ? If you are overweight and have a high risk for diabetes, consider being tested at a younger age or more often.  A one-time screening for abdominal aortic aneurysm (AAA) by ultrasound is recommended for men aged 57-75 years who are current or former smokers. What should I know about preventing infection? Hepatitis B If you have a higher risk for hepatitis B, you should be screened for this virus. Talk with your health care provider to find out if you are at risk for hepatitis B infection. Hepatitis C Blood testing is recommended for:  Everyone born from [birth year] through 1967.  Anyone with known risk factors for hepatitis C.  Sexually Transmitted Diseases (STDs)  You should be screened each year for STDs including gonorrhea and chlamydia if: ? You are sexually active and are younger than 59 years of age. ? You are older than 59 years of age and your health care provider tells you that you are at risk for this type of infection. ? Your sexual activity has changed since you were last screened and you are at an increased risk for chlamydia or gonorrhea. Ask your health care provider if you are at risk.  Talk with your health care provider about whether you are at high risk of being infected with HIV. Your health care provider may recommend a prescription medicine to help prevent HIV infection.  What else can I do?  Schedule regular health, dental, and eye exams.  Stay current with your vaccines (immunizations).  Do not use any tobacco products, such as cigarettes, chewing tobacco, and e-cigarettes. If you need help quitting, ask your health care provider.  Limit alcohol intake to no more than 2 drinks per day.  One drink equals 12 ounces of beer, 5 ounces of wine, or 1 ounces of hard liquor.  Do not use street drugs.  Do not share needles.  Ask your health care provider for help if you need support or information about quitting drugs.  Tell your health care provider if you often feel depressed.  Tell your health care provider if you have ever been abused or do not feel safe at home. This information is not intended to replace advice given to you by your health care provider. Make sure you discuss any questions you have with your health care provider. Document Released: 05/25/2008 Document Revised: 07/26/2016 Document Reviewed: 08/31/2015 Elsevier Interactive Patient Education  [employer].

## 2018-05-16 MED ORDER — METFORMIN HCL 500 MG PO TABS: 1000.0000 mg | ORAL_TABLET | Freq: Two times a day (BID) | ORAL | 3 refills | Status: DC

## 2018-05-17 MED FILL — metFORMIN HCL 500 MG TABS: 500 | 90 days supply | Qty: 360 | Fill #0

## 2018-05-20 ENCOUNTER — Telehealth: Payer: Self-pay | Admitting: *Deleted

## 2018-05-20 DIAGNOSIS — R05 Cough: Secondary | ICD-10-CM

## 2018-05-20 DIAGNOSIS — R059 Cough, unspecified: Secondary | ICD-10-CM

## 2018-05-20 DIAGNOSIS — L298 Other pruritus: Secondary | ICD-10-CM | POA: Diagnosis not present

## 2018-05-20 DIAGNOSIS — L821 Other seborrheic keratosis: Secondary | ICD-10-CM | POA: Diagnosis not present

## 2018-05-20 MED ORDER — ALBUTEROL SULFATE HFA 108 (90 BASE) MCG/ACT IN AERS: 2.0000 | INHALATION_SPRAY | Freq: Four times a day (QID) | RESPIRATORY_TRACT | 0 refills | Status: DC | PRN

## 2018-05-20 MED ORDER — HYDROCODONE-HOMATROPINE 5-1.5 MG/5ML PO SYRP: 5.0000 mL | ORAL_SOLUTION | Freq: Three times a day (TID) | ORAL | 0 refills | Status: AC | PRN

## 2018-05-20 MED FILL — HYDROCODONE-HOMATROPINE SOL: 5-1.5 | 4 days supply | Qty: 60 | Fill #0

## 2018-05-20 MED FILL — VENTOLIN HFA 90 MCG INHALER: 108 (90 BAS | 25 days supply | Qty: 18 | Fill #0

## 2018-05-20 NOTE — Telephone Encounter (Signed)
Called him back- he reports that he is still having some sinus drainage. He notes a feeling of fluid in his lungs, and he is coughing up some material  However he is overall feeling better Will rx albuterol and hycodan for his sx Cautioned re sedation with hycodan He will let me know if not helpful  Avoid pred due to IDDM

## 2018-05-20 NOTE — Telephone Encounter (Signed)
Copied from [place] [phone]. Topic: General - Other >> May 20, 2018 10:58 AM Oneta Rack wrote: Relation to pt: self Call back [phone] Pharmacy:  [place], [place] - [address] [phone] (Phone) [phone] (Fax)  Reason for call:  Patient was seen by PCP on 05/15/18 and states PCP aware of nasal drainage and upper respiratory infection, patient states symptoms have not improved and seeking Rx, please advise >> May 20, 2018 11:05 AM Oneta Rack wrote: Relation to pt: self Call back [phone] Pharmacy:  [place], [place] [phone] (Phone) [phone] (Fax)  Reason for call:  Patient was seen by PCP on 05/15/18 and states PCP aware of nasal drainage and upper respiratory infection, patient states symptoms have not improved and seeking Rx, please advise

## 2018-05-27 ENCOUNTER — Encounter: Payer: Self-pay | Admitting: Family Medicine

## 2018-05-27 DIAGNOSIS — Z20828 Contact with and (suspected) exposure to other viral communicable diseases: Secondary | ICD-10-CM

## 2018-05-30 ENCOUNTER — Ambulatory Visit: Payer: 59 | Admitting: Family Medicine

## 2018-06-03 ENCOUNTER — Ambulatory Visit: Payer: 59 | Admitting: Family Medicine

## 2018-06-03 ENCOUNTER — Ambulatory Visit (HOSPITAL_BASED_OUTPATIENT_CLINIC_OR_DEPARTMENT_OTHER)
Admission: RE | Admit: 2018-06-03 | Discharge: 2018-06-03 | Disposition: A | Discharge status: Home / Self Care | Payer: 59 | Source: Ambulatory Visit | Attending: Family Medicine | Admitting: Family Medicine

## 2018-06-03 ENCOUNTER — Other Ambulatory Visit (INDEPENDENT_AMBULATORY_CARE_PROVIDER_SITE_OTHER): Payer: 59

## 2018-06-03 ENCOUNTER — Encounter: Payer: Self-pay | Admitting: Family Medicine

## 2018-06-03 VITALS — BP 119/80 | HR 92 | Ht 73.0 in | Wt 185.4 lb

## 2018-06-03 DIAGNOSIS — G8929 Other chronic pain: Secondary | ICD-10-CM

## 2018-06-03 DIAGNOSIS — M545 Low back pain, unspecified: Secondary | ICD-10-CM

## 2018-06-03 DIAGNOSIS — Z20828 Contact with and (suspected) exposure to other viral communicable diseases: Secondary | ICD-10-CM | POA: Diagnosis not present

## 2018-06-03 DIAGNOSIS — M5137 Other intervertebral disc degeneration, lumbosacral region: Secondary | ICD-10-CM | POA: Insufficient documentation

## 2018-06-03 NOTE — Progress Notes (Signed)
Subjective:   PCP and consultation requested by Dr. Janett Billow [name]   Patient ID: Thomas Peterson is a 57 y.o. male.  Chief Complaint: Left SI joint pain  HPI: Patient presents with a 5 year history of gradually worsening left sided lower back pain. He has a history of cervical disc degeneration with mild spinal and moderate foraminal stenosis of C5-7 and severe C8 foraminal stenosis. He denies any possible inciting injury and states the pain has been localized to the same location since onset, which he believes to be the SI joint. Occasionally, he says he will get a dull, achy type pain in his left leg that is poorly localized and does not seem to be triggered by anything. He denies any shooting pains from his lower back or tingling into his legs but says his feet are often numb, L>R, which he attributes mostly to diabetes. He has not tried any analgesics because he is worried about interactions with his other medications but says acupuncture has helped in the past. Slouching can make the pain worse but he denies any other aggravating factors. Pain is 3/10 severity.   ROS: Constitutional: Negative for fever, chills, diaphoresis. Skin: Negative for bruising, erythema, ecchymosis.  MSK: See HPI above.  Neuro: See HPI above.  Past Medical History:  Diagnosis Date  . Crohn disease ([place])   . Crohn's disease without complication ([place]) 1/44/8185  . Diabetes mellitus without complication [facility])     Current Outpatient Medications on File Prior to Visit  Medication Sig Dispense Refill  . Adalimumab (HUMIRA PEN) 40 MG/0.8ML PNKT Inject 40 mg into the skin every 14 (fourteen) days. 2 each 5  . albuterol (PROVENTIL HFA;VENTOLIN HFA) 108 (90 Base) MCG/ACT inhaler Inhale 2 puffs into the lungs every 6 (six) hours as needed for wheezing or shortness of breath. 1 Inhaler 0  . amLODipine (NORVASC) 10 MG tablet TAKE 1 TABLET BY MOUTH DAILY 90 tablet 3  . budesonide (ENTOCORT EC) 3 MG 24 hr capsule TAKE 1 TO 3  CAPSULES BY MOUTH DAILY AS NEEDED FOR CROHN'S SYMPTOMS 180 capsule 3  . doxycycline (VIBRAMYCIN) 100 MG capsule Take 1 capsule (100 mg total) by mouth 2 (two) times daily. 20 capsule 0  . ezetimibe (ZETIA) 10 MG tablet TAKE 1 TABLET (10 MG TOTAL) BY MOUTH DAILY. 90 tablet 3  . Insulin Glargine (TOUJEO SOLOSTAR) 300 UNIT/ML SOPN Inject 30 Units into the skin daily. 9 mL 3  . Insulin Pen Needle (PEN NEEDLES) 32G X 6 MM MISC 1 Device by Does not apply route daily. 100 each prn  . metFORMIN (GLUCOPHAGE) 500 MG tablet Take 2 tablets (1,000 mg total) by mouth 2 (two) times daily with a meal. 360 tablet 3  . pioglitazone (ACTOS) 30 MG tablet TAKE 1 TABLET BY MOUTH DAILY 90 tablet 3  . valsartan-hydrochlorothiazide (DIOVAN-HCT) 320-25 MG tablet TAKE 1 TABLET BY MOUTH DAILY 90 tablet 3   No current facility-administered medications on file prior to visit.     No past surgical history on file.  Allergies  Allergen Reactions  . Sulfa Antibiotics Anaphylaxis    "I got these purple spots everywhere"   . Ciprofloxacin   . Flagyl [Metronidazole]     GI effects   . Statins Other (See Comments)    Leg cramps    Social History   Socioeconomic History  . Marital status: Married    Spouse name: Not on file  . Number of children: Not on file  . Years of education:  Not on file  . Highest education level: Not on file  Occupational History  . Not on file  Social Needs  . Financial resource strain: Not on file  . Food insecurity:    Worry: Not on file    Inability: Not on file  . Transportation needs:    Medical: Not on file    Non-medical: Not on file  Tobacco Use  . Smoking status: Never Smoker  . Smokeless tobacco: Never Used  Substance and Sexual Activity  . Alcohol use: Not on file  . Drug use: Not on file  . Sexual activity: Not on file  Lifestyle  . Physical activity:    Days per week: Not on file    Minutes per session: Not on file  . Stress: Not on file  Relationships  .  Social connections:    Talks on phone: Not on file    Gets together: Not on file    Attends religious service: Not on file    Active member of club or organization: Not on file    Attends meetings of clubs or organizations: Not on file    Relationship status: Not on file  . Intimate partner violence:    Fear of current or ex partner: Not on file    Emotionally abused: Not on file    Physically abused: Not on file    Forced sexual activity: Not on file  Other Topics Concern  . Not on file  Social History Narrative  . Not on file    No family history on file.  BP 119/80   Pulse 92   Ht 6' 1"  (1.854 m)   Wt 185 lb 6.4 oz (84.1 kg)   BMI 24.46 kg/m   Objective:  Physical Exam: Gen: NAD, comfortable in exam room  Back: No gross deformity, scoliosis. Mild TTP superior to SI joint on left side. No midline or bony TTP. FROM without pain. Strength 5/5 in BLEs. Negative piriformis, FABER. Trace MSRs in patellar and achilles tendons, equal bilaterally. Negative SLR bilaterally. Sensation intact to light touch bilaterally. Negative logroll bilateral hips. NVI distally.   Assessment:     1. Chronic left-sided low back pain without sciatica    Plan:  Lumbar spine radiographs obtained and reviewed reveal advanced L5-S1 disc degeneration but his symptoms today are most likely due to a lumbar paraspinal muscle strain. He was educated on this and shown at home exercises to perform. We recommended that he try acetaminophen or naproxen as needed as well as ice or heat. May consider physical therapy if symptoms persist. All questions and concerns were answered to his satisfaction. He will follow up in 6 weeks if needed.

## 2018-06-03 NOTE — Patient Instructions (Addendum)
Your pain is higher than is typical for SI joint dysfunction/sacroiliitis. Your exam is also reassuring. This is consistent with chronic strain/scar tissue within deep paraspinal muscle of the lumbar spine along with moderate degenerative disc disease. Consider physical therapy as the next step but acupuncture and chiropractic care are other considerations. Do home exercises daily for the next 6 weeks. Ice or heat (whichever feels better at this point) 15 minutes at a time 3-4 times a day. These are different medications you can use for this though I don't expect these to make a huge difference: Tylenol 521m 1-2 tabs three times a day for pain. Capsaicin, aspercreme, or biofreeze topically up to four times a day may also help with pain. Some supplements that may help for arthritis: Boswellia extract, curcumin, pycnogenol Aleve 1-2 tabs twice a day with food Follow up with me in 6 weeks or as needed.

## 2018-06-04 ENCOUNTER — Encounter: Payer: Self-pay | Admitting: Family Medicine

## 2018-06-04 DIAGNOSIS — M545 Low back pain, unspecified: Secondary | ICD-10-CM | POA: Insufficient documentation

## 2018-06-04 LAB — MEASLES/MUMPS/RUBELLA IMMUNITY
Mumps IgG: 88.6 AU/mL
Rubella: 20.4 index
Rubeola IgG: <25 AU/mL — ABNORMAL LOW

## 2018-06-04 NOTE — Assessment & Plan Note (Signed)
Lumbar spine radiographs obtained and reviewed reveal advanced L5-S1 disc degeneration but his symptoms today are most likely due to a lumbar paraspinal muscle strain. He was educated on this and shown at home exercises to perform. We recommended that he try acetaminophen or naproxen as needed as well as ice or heat. May consider physical therapy if symptoms persist. All questions and concerns were answered to his satisfaction. He will follow up in 6 weeks if needed.

## 2018-06-05 ENCOUNTER — Other Ambulatory Visit: Payer: Self-pay | Admitting: Family Medicine

## 2018-06-05 MED FILL — EZETIMIBE 10 MG TABLET: 10 | 90 days supply | Qty: 90 | Fill #1

## 2018-06-05 MED FILL — TOUJEO SOLOSTAR 300 UNITS/M: 300 | 90 days supply | Qty: 9 | Fill #0

## 2018-06-05 MED FILL — VENLAFAXINE HCL ER 37.5 MG: 37.5 | 90 days supply | Qty: 90 | Fill #0

## 2018-06-11 ENCOUNTER — Other Ambulatory Visit: Payer: Self-pay | Admitting: Internal Medicine

## 2018-06-11 ENCOUNTER — Encounter: Payer: Self-pay | Admitting: Family Medicine

## 2018-06-11 DIAGNOSIS — K5090 Crohn's disease, unspecified, without complications: Secondary | ICD-10-CM

## 2018-06-11 MED ORDER — ADALIMUMAB 40 MG/0.8ML ~~LOC~~ AJKT: 40.0000 mg | AUTO-INJECTOR | SUBCUTANEOUS | 5 refills | Status: DC

## 2018-06-11 MED FILL — HUMIRA PEN 40 MG/0.8ML PNKT: 40 | 28 days supply | Qty: 2 | Fill #0

## 2018-07-01 MED FILL — PIOGLITAZONE HCL 30 MG TAB: 30 | 90 days supply | Qty: 90 | Fill #1

## 2018-07-05 MED FILL — HUMIRA PEN 40 MG/0.8ML PNKT: 40 | 28 days supply | Qty: 2 | Fill #1

## 2018-07-25 ENCOUNTER — Telehealth: Payer: Self-pay | Admitting: Internal Medicine

## 2018-07-26 NOTE — Telephone Encounter (Signed)
I am happy to accept him - please schedule a new patient appt

## 2018-07-30 MED FILL — AMLODIPINE BESYLATE 10 MG T: 10 | 90 days supply | Qty: 90 | Fill #1

## 2018-07-30 MED FILL — TECHLITE PEN NDL 32GX1/4": 32G X 6 MM | 90 days supply | Qty: 100 | Fill #2

## 2018-07-30 MED FILL — VALSARTAN-HCTZ 320-25 MG TA: 320-25 | 90 days supply | Qty: 90 | Fill #1

## 2018-07-30 MED FILL — HUMIRA PEN 40 MG/0.8ML PNKT: 40 | 28 days supply | Qty: 2 | Fill #2

## 2018-07-30 MED FILL — TECHLITE PEN NDL 32GX1/4: 32G X 6 MM | 90 days supply | Qty: 100 | Fill #2

## 2018-08-02 ENCOUNTER — Telehealth: Payer: Self-pay | Admitting: Pharmacist

## 2018-08-02 NOTE — Telephone Encounter (Signed)
Called patient to schedule an appointment for the [facility]. I was unable to reach the patient so I left a HIPAA-compliant message requesting that the patient return my call.

## 2018-08-05 ENCOUNTER — Ambulatory Visit (INDEPENDENT_AMBULATORY_CARE_PROVIDER_SITE_OTHER): Payer: 59 | Admitting: Pharmacist

## 2018-08-05 DIAGNOSIS — Z79899 Other long term (current) drug therapy: Secondary | ICD-10-CM

## 2018-08-05 MED ORDER — ADALIMUMAB 40 MG/0.8ML ~~LOC~~ AJKT: 40.0000 mg | AUTO-INJECTOR | SUBCUTANEOUS | 5 refills | Status: DC

## 2018-08-05 NOTE — Progress Notes (Signed)
   S: Patient presents to [facility] for review of their specialty medication therapy.  Patient is currently taking Humira for Crohn's disease. Patient is managed by Eduard Roux for this but will be switching to Dr. Carlean Purl in October. He has been frustrated with his care and getting refills so he is hoping switching practices will help with this.   He has been on Remicade and 6-MP in the past.  Adherence: denies any missed doses  Efficacy: feels like it is working well for him.  Dosing:  Crohn disease: SubQ Maintenance: 40 mg every other week beginning day 29.   Drug-drug interactions: none  Screening: TB test: completed per patient Hepatitis: completed per patient  Monitoring: S/sx of infection: denies CBC: see below, monitored q3 months S/sx of hypersensitivity: denies S/sx of malignancy: denies S/sx of heart failure: denies  O:     Lab Results  Component Value Date   WBC 5.9 05/15/2018   HGB 15.1 05/15/2018   HCT 43.6 05/15/2018   MCV 92.3 05/15/2018   PLT 286.0 05/15/2018      Chemistry      Component Value Date/Time   NA 140 05/15/2018 0956   K 4.3 05/15/2018 0956   CL 100 05/15/2018 0956   CO2 32 05/15/2018 0956   BUN 18 05/15/2018 0956   CREATININE 1.19 05/15/2018 0956      Component Value Date/Time   CALCIUM 9.4 05/15/2018 0956   ALKPHOS 44 05/15/2018 0956   AST 17 05/15/2018 0956   ALT 11 05/15/2018 0956   BILITOT 0.4 05/15/2018 0956       A/P: 1. Medication review: Patient currently on Humira for Crohn's disease. Reviewed the medication with the patient, including the following: Humira is a TNF blocking agent indicated for ankylosing spondylitis, Crohn's disease, Hidradenitis suppurativa, psoriatic arthritis, plaque psoriasis, ulcerative colitis, and uveitis. The most common adverse effects are infections, headache, and injection site reactions. There is the possibility of an increased risk of malignancy but it is not well understood if this  increased risk is due to there medication or the disease state. There are rare cases of pancytopenia and aplastic anemia. No recommendations for any changes. Patient has my contact information if he needs any refill assistance.    Christella Hartigan, PharmD, BCPS, BCACP, CPP Clinical Pharmacist Practitioner  [phone]

## 2018-09-02 MED FILL — HUMIRA PEN 40 MG/0.8ML PNKT: 40 | 28 days supply | Qty: 2 | Fill #3

## 2018-09-09 MED FILL — EZETIMIBE 10 MG TABLET: 10 | 90 days supply | Qty: 90 | Fill #2

## 2018-09-09 MED FILL — TOUJEO SOLOSTAR 300 UNITS/M: 300 | 90 days supply | Qty: 9 | Fill #1

## 2018-09-09 MED FILL — VENLAFAXINE HCL ER 37.5 MG: 37.5 | 90 days supply | Qty: 90 | Fill #1

## 2018-09-17 ENCOUNTER — Encounter: Payer: Self-pay | Admitting: Internal Medicine

## 2018-09-17 ENCOUNTER — Other Ambulatory Visit: Payer: 59

## 2018-09-17 ENCOUNTER — Ambulatory Visit: Payer: 59 | Admitting: Internal Medicine

## 2018-09-17 DIAGNOSIS — Z7960 Long term (current) use of unspecified immunomodulators and immunosuppressants: Secondary | ICD-10-CM | POA: Insufficient documentation

## 2018-09-17 DIAGNOSIS — Z79899 Other long term (current) drug therapy: Secondary | ICD-10-CM | POA: Diagnosis not present

## 2018-09-17 DIAGNOSIS — K50818 Crohn's disease of both small and large intestine with other complication: Secondary | ICD-10-CM | POA: Diagnosis not present

## 2018-09-17 NOTE — Patient Instructions (Signed)
  You have been scheduled for a colonoscopy. Please follow written instructions given to you at your visit today.  Please pick up your prep supplies at the pharmacy. If you use inhalers (even only as needed), please bring them with you on the day of your procedure.   Your provider has requested that you go to the basement level for lab work before leaving today. Press "B" on the elevator. The lab is located at the first door on the left as you exit the elevator.   I appreciate the opportunity to care for you. Silvano Rusk, MD, [facility]

## 2018-09-17 NOTE — Assessment & Plan Note (Signed)
Colon Adalimumab quantiferon

## 2018-09-17 NOTE — Progress Notes (Signed)
Thomas Peterson 57 y.o. 1961/07/02 [id number] Referred by: Darreld Mclean, MD  Assessment & Plan:   Encounter Diagnoses  Name Primary?  . Crohn's disease of both small and large intestine with other complication ([place])   . Long-term use of immunosuppressant medication      Check adalimumab antibodies and levels he is due for a dose in 2 days so I think today is adequate for a trough level.  Check QuantiFERON.  Schedule colonoscopy to reassess status of his Crohn's disease.  The risks and benefits as well as alternatives of endoscopic procedure(s) have been discussed and reviewed. All questions answered. The patient agrees to proceed.   I appreciate the opportunity to care for this patient. 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  I have reviewed outside records from [facility], he wants to establish care here.  Current symptoms are intermittent, twice a month feeling very fatigued and tired neurolysed but more so lower or periumbilical abdominal discomfort and then profuse watery diarrhea.  He will take a few days of budesonide for this and rapid relief ensues.  Within a day actually..  Last colonoscopy 2016 or earlier.  That was normal he says.  He also has had a rash since starting Humira, red bumps, gets relief from antifungal soaps and occasional Zyrtec.  He tolerates this.  Was satisfied Dr. Rolly Salter care at [facility] but communication back and forth was difficult and he is hoping for better their clinic.  In the past he saw Dr. Coralyn Mark from 2002-20 14, he saw Dr. Jeronimo Norma [facility]) in 20 14-20 17 and has seen Dr. [place] Crane from 20 17-20 19.  Review of records from [place].  I have read and reviewed these and then copied and pasted them into the chart.  We discussed these as well. 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 He has been maintained on low-dose budesonide, mercaptopurine, and allopurinol to flip his 6 MMP:6-TG ratio. Because he had ongoing symptoms despite this treatment, we decided to begin Humira. Our plan was for him to continue on mercaptopurine as dual therapy, but drop the allopurinol and come off of budesonide. He started Humira in 06/2017 but then stopped taking the mercaptopurine and budesonide about a month later. He reports that for the first 2 months, he felt back to normal (he tells me he was "superman") and was having normally formed bowel movements. Unfortunately, after coming off of these other medications, his symptoms have come back a little bit though are not as severe as they were before.  Currently, he reports that he is feeling a little better than he was at the time of her last visit. He continues to have 1-2 bowel movements per day that in general are softly formed. He denies abdominal pain, rectal bleeding, nausea, vomiting. He does have some urgency but no tenesmus. He has had occasional diarrhea which is associated with some abdominal pain that improves after passage of stool, but this is not common. His biggest complaint currently has been a rash that started 1 month after he began Humira. He has tried antifungal soaps, oatmeal soaps, and Zyrtec. Only the Zyrtec seems to help. At first, he thought that it was very specifically related to the timing of Humira, but now it does  not seem to be linked with that.  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 He reports that he started with diarrhea every winter starting in 1986. This was associated with abdominal pain. Initially they did a colonoscopy in 1996 and was told he had ulcerative colitis. He was started on Prednisone and Azulfidine with no change in symptoms. They changed the  diagnosis to Crohn's 3-4 years later. He was switched to [facility] by a doctor in [place]. He moved to [facility] in 2002 and Dr. Coralyn Mark started him on mercaptopurine. He was in remission for a long time on this. They tried Asacol with no effect. He tried Remicade at one point and he felt well for 3 weeks after the first infusion, 1 week after the second infusion, then no response to the third infusion. It was stopped at that time. He reports that after moving to [place], [place], he began having severe fatigue followed by generalized abdominal pain. After that he began having diarrhea. They added allopurinol to flip the 6-MMP:6-TG ratios, but this had never really had a big effect. He had a colonoscopy in 01/2015 that showed a normal colon and TI. Capsule endoscopy 07/2015 showed mid small bowel ulcers.   Currently, he reports that he has 2 bowel movements per day that are reasonably formed for the past week, but before that was having several episodes of urgent diarrhea per day. There is no blood in the stool. He has been having abdominal pain recently."      Allergies  Allergen Reactions  . Sulfa Antibiotics Anaphylaxis    "I got these purple spots everywhere"   . Ciprofloxacin   . Flagyl [Metronidazole]     GI effects   . Statins Other (See Comments)    Leg cramps   Current Meds  Medication Sig  . Adalimumab (HUMIRA PEN) 40 MG/0.8ML PNKT Inject 40 mg into the skin every 14 (fourteen) days.  Marland Kitchen amLODipine (NORVASC) 10 MG tablet TAKE 1 TABLET BY MOUTH DAILY  . Boswellia Serrata (BOSWELLIA PO) Take 1 tablet by mouth 2 (two) times daily.  . budesonide (ENTOCORT EC) 3 MG 24 hr capsule TAKE 1 TO 3 CAPSULES BY MOUTH DAILY AS NEEDED FOR CROHN'S SYMPTOMS  . Cholecalciferol (VITAMIN D3 PO) Take by mouth. 2500 iu daily  . ezetimibe (ZETIA) 10 MG tablet TAKE 1 TABLET (10 MG TOTAL) BY MOUTH DAILY.  Marland Kitchen Insulin Glargine (TOUJEO SOLOSTAR) 300 UNIT/ML SOPN Inject 30 Units into the skin daily.  . Insulin Pen Needle  (PEN NEEDLES) 32G X 6 MM MISC 1 Device by Does not apply route daily.  Marland Kitchen MAGNESIUM PO Take 250 mg by mouth daily.  . metFORMIN (GLUCOPHAGE) 500 MG tablet Take 2 tablets (1,000 mg total) by mouth 2 (two) times daily with a meal.  . Multiple Vitamin (MULTIVITAMIN) tablet Take 1 tablet by mouth daily.  . Omega-3 Fatty Acids (FISH OIL PO) Take 4 g by mouth daily.  . pioglitazone (ACTOS) 30 MG tablet TAKE 1 TABLET BY MOUTH DAILY  . Probiotic Product (VSL#3 PO) Take by mouth as needed.  . valsartan-hydrochlorothiazide (DIOVAN-HCT) 320-25 MG tablet TAKE 1 TABLET BY MOUTH DAILY  . venlafaxine XR (EFFEXOR-XR) 37.5 MG 24 hr capsule TAKE 1 CAPSULE BY MOUTH DAILY WITH BREAKFAST   Past Medical History:  Diagnosis Date  . Crohn's disease without complication ([place]) 8/54/6270  . Diabetes mellitus without complication ([place])   .  Dyslipidemia   . History of basal cell carcinoma   . History of melanoma   . History of mood disorder   . Hypertension   . Renal insufficiency    Past Surgical History:  Procedure Laterality Date  . COLONOSCOPY    . KNEE ARTHROSCOPY Right 1999  . MELANOMA EXCISION  1999   and squamous and basal cell excisions  . 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   Review of Systems See HPI.  Positive for some muscle pain and night sweats joint and back pain and sore throat.  All other review of systems are negative.  Objective:   Physical Exam @BP  116/72 (BP Location: Left Arm, Patient  Position: Sitting, Cuff Size: Normal)   Pulse 88   Ht 5' 11.5" (1.816 m) Comment: height measured without shoes  Wt 186 lb 4 oz (84.5 kg)   BMI 25.61 kg/m @  General:  Well-developed, well-nourished and in no acute distress Eyes:  anicteric. ENT:   Mouth and posterior pharynx free of lesions.  Neck:   supple w/o thyromegaly or mass.  Lungs: Clear to auscultation bilaterally. Heart:  S1S2, no rubs, murmurs, gallops. Abdomen:  soft, non-tender, no hepatosplenomegaly, hernia, or mass and BS+.  Rectal: Deferred until colonoscopy Lymph:  no cervical or supraclavicular adenopathy. Extremities:   no edema, cyanosis or clubbing Skin   no rash. Neuro:  A&O x 3.  Psych:  appropriate mood and  Affect.   Data Reviewed: Please see HPI

## 2018-09-19 LAB — QUANTIFERON-TB GOLD PLUS
NIL: 0.08 [IU]/mL
QuantiFERON-TB Gold Plus: NEGATIVE

## 2018-09-23 ENCOUNTER — Encounter: Payer: Self-pay | Admitting: Internal Medicine

## 2018-09-24 LAB — ADALIMUMAB+AB (SERIAL MONITOR): Adalimumab Drug Level: 9.6 ug/mL

## 2018-09-24 LAB — SERIAL MONITORING

## 2018-09-25 NOTE — Progress Notes (Signed)
Levels ok and no Ab's Good news My Chart message

## 2018-09-30 MED FILL — HUMIRA PEN 40 MG/0.8ML PNKT: 40 | 28 days supply | Qty: 2 | Fill #4

## 2018-09-30 MED FILL — PIOGLITAZONE HCL 30 MG TAB: 30 | 90 days supply | Qty: 90 | Fill #2

## 2018-09-30 MED FILL — metFORMIN HCL 500 MG TABS: 500 | 90 days supply | Qty: 360 | Fill #1

## 2018-10-04 ENCOUNTER — Encounter: Payer: Self-pay | Admitting: Family Medicine

## 2018-10-04 DIAGNOSIS — E118 Type 2 diabetes mellitus with unspecified complications: Secondary | ICD-10-CM

## 2018-10-04 DIAGNOSIS — Z794 Long term (current) use of insulin: Secondary | ICD-10-CM

## 2018-10-08 MED ORDER — GLUCOSE BLOOD VI STRP: ORAL_STRIP | 12 refills | Status: DC

## 2018-10-08 MED ORDER — FREESTYLE LITE DEVI: 0 refills | Status: DC

## 2018-10-08 MED FILL — FREESTYLE LITE METER: 1 days supply | Qty: 1 | Fill #0

## 2018-10-08 MED FILL — FREESTYLE LITE TEST STRIP: 50 days supply | Qty: 100 | Fill #0

## 2018-10-28 ENCOUNTER — Ambulatory Visit (AMBULATORY_SURGERY_CENTER): Payer: 59 | Admitting: Internal Medicine

## 2018-10-28 ENCOUNTER — Encounter: Payer: Self-pay | Admitting: Internal Medicine

## 2018-10-28 VITALS — BP 113/78 | HR 76 | Temp 98.1°F | Resp 16

## 2018-10-28 DIAGNOSIS — K50818 Crohn's disease of both small and large intestine with other complication: Secondary | ICD-10-CM | POA: Diagnosis not present

## 2018-10-28 DIAGNOSIS — E119 Type 2 diabetes mellitus without complications: Secondary | ICD-10-CM | POA: Diagnosis not present

## 2018-10-28 DIAGNOSIS — I10 Essential (primary) hypertension: Secondary | ICD-10-CM | POA: Diagnosis not present

## 2018-10-28 DIAGNOSIS — K5090 Crohn's disease, unspecified, without complications: Secondary | ICD-10-CM | POA: Diagnosis not present

## 2018-10-28 DIAGNOSIS — K529 Noninfective gastroenteritis and colitis, unspecified: Secondary | ICD-10-CM | POA: Diagnosis not present

## 2018-10-28 MED ORDER — SODIUM CHLORIDE 0.9 % IV SOLN: 500.0000 mL | Freq: Once | INTRAVENOUS | Status: DC

## 2018-10-28 NOTE — Patient Instructions (Addendum)
There is inflammation in most of the colon - mild - but there.  I took biopsies per surveillance protocol and also took biopsies of normal terminal ileum.  Once I see the results we can determine next steps but looks like therapy is not adequate.  I appreciate the opportunity to care for you. Gatha Mayer, MD, FACG YOU HAD AN ENDOSCOPIC PROCEDURE TODAY AT [facility]:   Refer to the procedure report that was given to you for any specific questions about what was found during the examination.  If the procedure report does not answer your questions, please call your gastroenterologist to clarify.  If you requested that your care partner not be given the details of your procedure findings, then the procedure report has been included in a sealed envelope for you to review at your convenience later.  YOU SHOULD EXPECT: Some feelings of bloating in the abdomen. Passage of more gas than usual.  Walking can help get rid of the air that was put into your GI tract during the procedure and reduce the bloating. If you had a lower endoscopy (such as a colonoscopy or flexible sigmoidoscopy) you may notice spotting of blood in your stool or on the toilet paper. If you underwent a bowel prep for your procedure, you may not have a normal bowel movement for a few days.  Please Note:  You might notice some irritation and congestion in your nose or some drainage.  This is from the oxygen used during your procedure.  There is no need for concern and it should clear up in a day or so.  SYMPTOMS TO REPORT IMMEDIATELY:   Following lower endoscopy (colonoscopy or flexible sigmoidoscopy):  Excessive amounts of blood in the stool  Significant tenderness or worsening of abdominal pains  Swelling of the abdomen that is new, acute  Fever of 100F or higher   For urgent or emergent issues, a gastroenterologist can be reached at any hour by calling [phone].   DIET:  We do recommend a  small meal at first, but then you may proceed to your regular diet.  Drink plenty of fluids but you should avoid alcoholic beverages for 24 hours.  MEDICATIONS: Continue present medications.   Please see handouts given to you by your recovery nurse.  ACTIVITY:  You should plan to take it easy for the rest of today and you should NOT DRIVE or use heavy machinery until tomorrow (because of the sedation medicines used during the test).    FOLLOW UP: Our staff will call the number listed on your records the next business day following your procedure to check on you and address any questions or concerns that you may have regarding the information given to you following your procedure. If we do not reach you, we will leave a message.  However, if you are feeling well and you are not experiencing any problems, there is no need to return our call.  We will assume that you have returned to your regular daily activities without incident.  If any biopsies were taken you will be contacted by phone or by letter within the next 1-3 weeks.  Please call us at [phone] if you have not heard about the biopsies in 3 weeks.   Thank you for allowing [place] to provide for your healthcare needs today.  SIGNATURES/CONFIDENTIALITY: You and/or your care partner have signed paperwork which will be entered into your electronic medical record.  These signatures attest to  the fact that that the information above on your After Visit Summary has been reviewed and is understood.  Full responsibility of the confidentiality of this discharge information lies with you and/or your care-partner.

## 2018-10-28 NOTE — Progress Notes (Signed)
Called to room to assist during endoscopic procedure.  Patient ID and intended procedure confirmed with present staff. Received instructions for my participation in the procedure from the performing physician.  

## 2018-10-28 NOTE — Progress Notes (Signed)
Report given to PACU, vss 

## 2018-10-28 NOTE — Op Note (Signed)
[place] Patient Name: Quantae Primiano Procedure Date: 10/28/2018 4:11 PM MRN: [id number] Endoscopist: Gatha Mayer , MD Age: 57 Referring MD:  Date of Birth: September 23, 1961 Gender: Male Account #: [id number] Procedure:                Colonoscopy Indications:              Crohn's disease of the small bowel and colon,                            Follow-up of Crohn's disease of the small bowel and                            colon, Disease activity assessment of Crohn's                            disease of the small bowel and colon, Assess                            therapeutic response to therapy of Crohn's disease                            of the small bowel and colon Medicines:                Propofol per Anesthesia, Monitored Anesthesia Care Procedure:                Pre-Anesthesia Assessment:                           - Prior to the procedure, a History and Physical                            was performed, and patient medications and                            allergies were reviewed. The patient's tolerance of                            previous anesthesia was also reviewed. The risks                            and benefits of the procedure and the sedation                            options and risks were discussed with the patient.                            All questions were answered, and informed consent                            was obtained. Prior Anticoagulants: The patient has                            taken no previous anticoagulant or antiplatelet  agents. ASA Grade Assessment: II - A patient with                            mild systemic disease. After reviewing the risks                            and benefits, the patient was deemed in                            satisfactory condition to undergo the procedure.                           After obtaining informed consent, the colonoscope                            was passed under  direct vision. Throughout the                            procedure, the patient's blood pressure, pulse, and                            oxygen saturations were monitored continuously. The                            Model CF-HQ190L [id number]) scope was introduced                            through the anus and advanced to the the terminal                            ileum, with identification of the appendiceal                            orifice and IC valve. The colonoscopy was performed                            without difficulty. The patient tolerated the                            procedure well. The quality of the bowel                            preparation was good. The bowel preparation used                            was Miralax. The terminal ileum, ileocecal valve,                            appendiceal orifice, and rectum were photographed. Scope In: 4:34:38 PM Scope Out: 4:55:04 PM Scope Withdrawal Time: 0 hours 18 minutes 11 seconds  Total Procedure Duration: 0 hours 20 minutes 26 seconds  Findings:                 The perianal and digital rectal examinations  were                            normal. Pertinent negatives include normal prostate                            (size, shape, and consistency).                           The terminal ileum appeared normal. Biopsies were                            taken with a cold forceps for histology.                            Verification of patient identification for the                            specimen was done. Estimated blood loss was minimal.                           A diffuse area of mildly erythematous, ulcerated                            and vascular-pattern-decreased mucosa was found in                            the sigmoid colon, in the descending colon, in the                            transverse colon, in the ascending colon and in the                            cecum. Biopsies were taken with a cold forceps for                             histology. Verification of patient identification                            for the specimen was done. Estimated blood loss was                            minimal.                           The rectum appeared normal. Biopsies were taken                            with a cold forceps for histology. Verification of                            patient identification for the specimen was done.  Estimated blood loss was minimal.                           The exam was otherwise without abnormality on                            direct and retroflexion views. Complications:            No immediate complications. Estimated Blood Loss:     Estimated blood loss was minimal. Impression:               - The examined portion of the ileum was normal.                            Biopsied.                           - Erythematous, ulcerated and                            vascular-pattern-decreased mucosa in the sigmoid                            colon, in the descending colon, in the transverse                            colon, in the ascending colon and in the cecum.                            Biopsied. Aphtae - mostly confluent - somewhat                            patchy                           - The rectum is normal. Biopsied.                           - The examination was otherwise normal on direct                            and retroflexion views.                           Path bottles 1) terminal ileum 2) cecum/ascending                            3) Transverse 4) Descecning proximal sigmoid 5)                            Distal sigmoid/rectum Recommendation:           - Patient has a contact number available for                            emergencies. The signs and symptoms of potential  delayed complications were discussed with the                            patient. Return to normal activities tomorrow.                             Written discharge instructions were provided to the                            patient.                           - Resume previous diet.                           - Continue present medications.                           - Repeat colonoscopy is recommended for                            surveillance. The colonoscopy date will be                            determined after pathology results from today's                            exam become available for review.                           - Await pathology to determine next steps.                           His therapy is not creating endoscopic or clinical                            remission.                           Adalimumab trough levels were appropriate and no                            antibodies found.                           Options will include adding mesalamine, increasing                            humira, changing biologic                           May need more investigation of small bowel -                            capsule vs cross-sectional imaging Gatha Mayer, MD 10/28/2018 5:07:04 PM This report has been signed electronically.

## 2018-10-28 NOTE — Progress Notes (Signed)
Pt's states no medical or surgical changes since previsit or office visit. 

## 2018-10-29 ENCOUNTER — Telehealth: Payer: Self-pay

## 2018-10-29 MED FILL — AMLODIPINE BESYLATE 10 MG T: 10 | 90 days supply | Qty: 90 | Fill #2

## 2018-10-29 MED FILL — VALSARTAN-HCTZ 320-25 MG TA: 320-25 | 90 days supply | Qty: 90 | Fill #2

## 2018-10-29 NOTE — Telephone Encounter (Signed)
  Follow up Call-  Call back number 10/28/2018  Post procedure Call Back phone  # [phone]  Permission to leave phone message Yes  Some recent data might be hidden     Patient questions:  Do you have a fever, pain , or abdominal swelling? No. Pain Score  0   Have you tolerated food without any problems? Yes.    Have you been able to return to your normal activities? Yes.    Do you have any questions about your discharge instructions: Diet   No. Medications  No. Follow up visit  No.  Do you have questions or concerns about your Care? No.  Actions: * If pain score is 4 or above: No action needed, pain <4.  No problems noted per pt.  maw

## 2018-10-30 ENCOUNTER — Telehealth: Payer: Self-pay | Admitting: Internal Medicine

## 2018-10-30 NOTE — Telephone Encounter (Signed)
Pt wants to speak directly to nurse

## 2018-10-30 NOTE — Telephone Encounter (Signed)
Patent needs a prior auth on Humira that is prescribed by his prior MD.  Dr. Carlean Purl is deciding future Humira dose.  He will come pick up a sample of Humira and we will contact him back with future plans.

## 2018-11-04 MED FILL — HUMIRA PEN 40 MG/0.8ML PNKT: 40 | 28 days supply | Qty: 2 | Fill #5

## 2018-11-10 NOTE — Progress Notes (Signed)
[facility] no letter or recall right now  Office - call patient and schedule CT-enterography to evluate Crohn's of small and large intestines with unspecified complication  Notes below are for me and the patient to read in My Chart  Active Crohn's colitis Terminal ileum ok My Chart message - need to sort out other investigation? Increase Humira vs change biologic - Humira causes a slight rash so maybe not Sxs sound obstructive so I will recommend

## 2018-11-11 ENCOUNTER — Other Ambulatory Visit: Payer: Self-pay

## 2018-11-11 ENCOUNTER — Telehealth: Payer: Self-pay

## 2018-11-11 DIAGNOSIS — K5090 Crohn's disease, unspecified, without complications: Secondary | ICD-10-CM

## 2018-11-11 DIAGNOSIS — K50818 Crohn's disease of both small and large intestine with other complication: Secondary | ICD-10-CM

## 2018-11-11 NOTE — Telephone Encounter (Signed)
Pt. Calling wanting to know if he can get his PSA drawn (ordered on 6/5 by Dr. Lorelei Pont) at the same time as his BUN/creatinine ordered today by Barb Merino, RN. Author told [facility] to tell pt. that it should not be a problem, but will route to [employer] and Kandis Mannan to confirm.

## 2018-11-11 NOTE — Telephone Encounter (Signed)
Called pt- I will just order the BYN/creat myself and then will send results to Dr.Gesssner. This will allow him to have all needed labs drawn at the same visit this week

## 2018-11-11 NOTE — Telephone Encounter (Signed)
The patient can not have two different orders from differnet providers on the same lab visit. Marland Kitchen

## 2018-11-11 NOTE — Telephone Encounter (Signed)
Dr Lorelei Pont -- please advise?  Notified pt we are unable to charge 2 different doctors in the same lab visit. Advised him I will ask his PCP if she is comfortable ordering the labs from Dr Carlean Purl under herself then we can collect those as well as the PSA she previously ordered. Please see future orders for BUN / Creat under Larence Penning RN (GI).  Pt is currently scheduled for 11/14/18 lab appt and I advised him we will let him know the outcome.

## 2018-11-14 ENCOUNTER — Other Ambulatory Visit: Payer: Self-pay | Admitting: Pharmacist

## 2018-11-14 ENCOUNTER — Other Ambulatory Visit (INDEPENDENT_AMBULATORY_CARE_PROVIDER_SITE_OTHER): Payer: 59

## 2018-11-14 DIAGNOSIS — K5090 Crohn's disease, unspecified, without complications: Secondary | ICD-10-CM | POA: Diagnosis not present

## 2018-11-14 DIAGNOSIS — R972 Elevated prostate specific antigen [PSA]: Secondary | ICD-10-CM | POA: Diagnosis not present

## 2018-11-14 LAB — CREATININE, SERUM: Creatinine, Ser: 1.15 mg/dL (ref 0.40–1.50)

## 2018-11-14 LAB — PSA: PSA: 1.61 ng/mL (ref 0.10–4.00)

## 2018-11-14 LAB — BUN: BUN: 19 mg/dL (ref 6–23)

## 2018-11-14 MED ORDER — ADALIMUMAB 40 MG/0.8ML ~~LOC~~ AJKT: 40.0000 mg | AUTO-INJECTOR | SUBCUTANEOUS | 5 refills | Status: DC

## 2018-11-15 ENCOUNTER — Encounter: Payer: Self-pay | Admitting: Family Medicine

## 2018-11-15 ENCOUNTER — Other Ambulatory Visit: Payer: Self-pay | Admitting: Family Medicine

## 2018-11-15 DIAGNOSIS — R972 Elevated prostate specific antigen [PSA]: Secondary | ICD-10-CM

## 2018-11-15 NOTE — Progress Notes (Signed)
Thanks Keane Scrape He is having a CT w/ contrast

## 2018-11-15 NOTE — Progress Notes (Signed)
Received his labs Send BUN/ creat to Dr. Carlean Purl Follow-up with pt about his PSA  Results for orders placed or performed in visit on 11/14/18  Creatinine  Result Value Ref Range   Creatinine, Ser 1.15 0.40 - 1.50 mg/dL  BUN  Result Value Ref Range   BUN 19 6 - 23 mg/dL  PSA  Result Value Ref Range   PSA 1.61 0.10 - 4.00 ng/mL   Lab Results  Component Value Date   PSA 1.61 11/14/2018   PSA 1.43 05/15/2018   PSA 1.05 01/29/2017

## 2018-11-25 ENCOUNTER — Ambulatory Visit (INDEPENDENT_AMBULATORY_CARE_PROVIDER_SITE_OTHER)
Admission: RE | Admit: 2018-11-25 | Discharge: 2018-11-25 | Disposition: A | Discharge status: Home / Self Care | Payer: 59 | Source: Ambulatory Visit | Attending: Internal Medicine | Admitting: Internal Medicine

## 2018-11-25 DIAGNOSIS — K50818 Crohn's disease of both small and large intestine with other complication: Secondary | ICD-10-CM | POA: Diagnosis not present

## 2018-11-25 DIAGNOSIS — R197 Diarrhea, unspecified: Secondary | ICD-10-CM | POA: Diagnosis not present

## 2018-11-25 MED ORDER — IOPAMIDOL (ISOVUE-300) INJECTION 61%
100.0000 mL | Freq: Once | INTRAVENOUS | Status: AC | PRN
  Administered 2018-11-25: 100 mL via INTRAVENOUS

## 2018-11-25 MED FILL — TECHLITE PEN NDL 32GX1/4: 32G X 6 MM | 90 days supply | Qty: 100 | Fill #3

## 2018-11-25 MED FILL — TOUJEO SOLOSTAR 300 UNITS/M: 300 | 30 days supply | Qty: 3 | Fill #2

## 2018-11-25 MED FILL — TECHLITE PEN NDL 32GX1/4": 32G X 6 MM | 90 days supply | Qty: 100 | Fill #3

## 2018-11-27 NOTE — Progress Notes (Signed)
CT-E looks ok Will call him soon and discuss plans

## 2018-11-28 NOTE — Progress Notes (Signed)
Message left on phone that I would send My Chart messsage to explain more and then f/u that way See My Chart message Think adding mesalamine reasonable

## 2018-12-03 MED FILL — HUMIRA PEN 40 MG/0.8ML PNKT: 40 | 28 days supply | Qty: 2 | Fill #0

## 2018-12-11 DIAGNOSIS — M1990 Unspecified osteoarthritis, unspecified site: Secondary | ICD-10-CM

## 2018-12-11 HISTORY — DX: Unspecified osteoarthritis, unspecified site: M19.90

## 2018-12-12 ENCOUNTER — Telehealth: Payer: Self-pay

## 2018-12-12 NOTE — Telephone Encounter (Signed)
Referral faxed to [facility]

## 2018-12-12 NOTE — Telephone Encounter (Signed)
-----   Message from Gatha Mayer, MD sent at 11/29/2018  5:09 PM EST ----- Regarding: stelara He has failed Remicade and Humira for Crohn's diesease   Please Rx Stelara and see if he can get it   He is 84.5 kg so 390 mg IV x 1 induction and then 90 mcg q 8 weeks start 8 weeks after induction  Please let me know about this and when he is starting etc   Thanks  CEG

## 2018-12-31 MED FILL — EZETIMIBE 10 MG TABS: 10 | 90 days supply | Qty: 90 | Fill #3

## 2018-12-31 MED FILL — HUMIRA PEN 40 MG/0.8ML PNKT: 40 | 28 days supply | Qty: 2 | Fill #1

## 2019-01-02 DIAGNOSIS — Z79899 Other long term (current) drug therapy: Secondary | ICD-10-CM | POA: Diagnosis not present

## 2019-01-02 DIAGNOSIS — K5090 Crohn's disease, unspecified, without complications: Secondary | ICD-10-CM | POA: Diagnosis not present

## 2019-01-02 DIAGNOSIS — M1990 Unspecified osteoarthritis, unspecified site: Secondary | ICD-10-CM | POA: Diagnosis not present

## 2019-01-08 NOTE — Telephone Encounter (Signed)
I spoke with the patient and he has met with [facility].  They are working on the infusion portion.He saw them last week and is waiting on insurance portion.  He understands to call me once he has had his initial infusion and I will send the maintenance rx for approval with insurance.   

## 2019-01-15 ENCOUNTER — Ambulatory Visit: Payer: 59 | Admitting: Pharmacist

## 2019-01-15 ENCOUNTER — Telehealth: Payer: Self-pay | Admitting: Pharmacist

## 2019-01-15 ENCOUNTER — Encounter: Payer: Self-pay | Admitting: Pharmacist

## 2019-01-15 DIAGNOSIS — Z79899 Other long term (current) drug therapy: Secondary | ICD-10-CM

## 2019-01-15 MED ORDER — USTEKINUMAB 90 MG/ML ~~LOC~~ SOSY: 90.0000 mg | PREFILLED_SYRINGE | SUBCUTANEOUS | 1 refills | Status: DC

## 2019-01-15 MED ORDER — USTEKINUMAB 130 MG/26ML IV SOLN: INTRAVENOUS | 0 refills | Status: DC

## 2019-01-15 NOTE — Telephone Encounter (Signed)
Called patient to schedule an appointment for [facility]. I was unable to reach the patient so I left a HIPAA-compliant message requesting that the patient return my call.

## 2019-01-15 NOTE — Progress Notes (Signed)
   S: Patient presents to [facility] for review of their specialty medication therapy.  Patient is currently taking Stelara for Crohn's disease. Patient is managed by Dr. Carlean Purl for this.   Adherence: has not started it yet  Efficacy: has not started it yet, was previously on Humira and Remicade and has failed both. Plans to start Stelara in the next few weeks.   Dosing:  Crohn disease:  Induction: IV: >55 kg to 85 kg: 390 mg as single dose Maintenance: SubQ: 90 mg every 8 weeks; begin maintenance dosing 8 weeks after the IV induction dose.  Screening: TB test: completed per patient Hepatitis: completed per patient  Monitoring: S/sx of infection: denies CBC: see below Reversible posterior leukoencephalopathy syndrome (RPLS - sx include headache, seizures, confusion, and visual disturbances): denies Squamous cell skin carcinoma: denies  Dosage form specific issues: . Latex: Packaging may contain natural latex rubber. . Polysorbate 80: Some dosage forms may contain polysorbate 80 (also known as Tweens). Hypersensitivity reactions, usually a delayed reaction, have been reported following exposure to pharmaceutical products containing polysorbate 80 in certain individuals Dolly Rias, 2002; Lucente 2000; Lollie Marrow, [place]). Thrombocytopenia, ascites, pulmonary deterioration, and renal and hepatic failure have been reported in premature neonates after receiving parenteral products containing polysorbate 80 (Alade, 1986; CDC, 1984). See manufacturer's labeling.   O:     Lab Results  Component Value Date   WBC 5.9 05/15/2018   HGB 15.1 05/15/2018   HCT 43.6 05/15/2018   MCV 92.3 05/15/2018   PLT 286.0 05/15/2018      Chemistry      Component Value Date/Time   NA 140 05/15/2018 0956   K 4.3 05/15/2018 0956   CL 100 05/15/2018 0956   CO2 32 05/15/2018 0956   BUN 19 11/14/2018 0719   CREATININE 1.15 11/14/2018 0719      Component Value Date/Time   CALCIUM 9.4  05/15/2018 0956   ALKPHOS 44 05/15/2018 0956   AST 17 05/15/2018 0956   ALT 11 05/15/2018 0956   BILITOT 0.4 05/15/2018 0956       A/P: 1. Medication review: patient currently prescribed Stelara for Crohn's disease but he has not started it yet. Has recently failed Humira. Reviewed the medication with the patient, including the following: Stelara, ustekinumab, is a TNF? blocker. Patient educated on purpose, proper use and potential adverse effects of Stelara.  Following instruction patient verbalized understanding of treatment plan. There is an increased risk of infection and malignancy with this medication. Do not give patients live vaccinations while they are on this medication. No recommendations for any changes.   Christella Hartigan, PharmD, BCPS, BCACP, CPP Clinical Pharmacist Practitioner  [phone]

## 2019-01-17 ENCOUNTER — Other Ambulatory Visit: Payer: Self-pay | Admitting: Pharmacist

## 2019-01-17 ENCOUNTER — Ambulatory Visit: Payer: 59 | Admitting: Medical

## 2019-01-17 ENCOUNTER — Telehealth: Payer: Self-pay

## 2019-01-17 ENCOUNTER — Encounter: Payer: Self-pay | Admitting: Medical

## 2019-01-17 VITALS — BP 123/80 | HR 93 | Temp 98.2°F | Resp 16 | Ht 71.5 in | Wt 189.6 lb

## 2019-01-17 DIAGNOSIS — R05 Cough: Secondary | ICD-10-CM

## 2019-01-17 DIAGNOSIS — J029 Acute pharyngitis, unspecified: Secondary | ICD-10-CM

## 2019-01-17 DIAGNOSIS — R059 Cough, unspecified: Secondary | ICD-10-CM

## 2019-01-17 DIAGNOSIS — J0100 Acute maxillary sinusitis, unspecified: Secondary | ICD-10-CM | POA: Diagnosis not present

## 2019-01-17 LAB — POC INFLUENZA A&B (BINAX/QUICKVUE)
Influenza A, POC: NEGATIVE
Influenza B, POC: NEGATIVE

## 2019-01-17 LAB — POCT RAPID STREP A (OFFICE): Rapid Strep A Screen: NEGATIVE

## 2019-01-17 MED ORDER — HYDROCODONE-HOMATROPINE 5-1.5 MG/5ML PO SYRP: 5.0000 mL | ORAL_SOLUTION | Freq: Four times a day (QID) | ORAL | 0 refills | Status: DC | PRN

## 2019-01-17 MED ORDER — DOXYCYCLINE HYCLATE 100 MG PO TABS: 100.0000 mg | ORAL_TABLET | Freq: Two times a day (BID) | ORAL | 0 refills | Status: DC

## 2019-01-17 MED ORDER — USTEKINUMAB 90 MG/ML ~~LOC~~ SOSY: 90.0000 mg | PREFILLED_SYRINGE | SUBCUTANEOUS | 3 refills | Status: DC

## 2019-01-17 MED ORDER — FLUTICASONE PROPIONATE 50 MCG/ACT NA SUSP: 2.0000 | Freq: Every day | NASAL | 1 refills | Status: DC

## 2019-01-17 MED FILL — HYDROCODONE-HOMATROPINE SYR: 5-1.5 | 5 days supply | Qty: 100 | Fill #0

## 2019-01-17 MED FILL — DOXYCYCLINE HYCLATE 100 MG: 100 | 10 days supply | Qty: 20 | Fill #0

## 2019-01-17 MED FILL — PIOGLITAZONE HCL 30 MG TAB: 30 | 90 days supply | Qty: 90 | Fill #3

## 2019-01-17 NOTE — Telephone Encounter (Signed)
Patient will have Stelara loading infusion on 01/29/19.  Rx sent for maintenance

## 2019-01-17 NOTE — Telephone Encounter (Signed)
-----   Message from Gatha Mayer, MD sent at 01/17/2019  8:36 AM EST ----- Regarding: Stelara See [facility] note from Dr. Dossie Der re: Delsa Grana  Will need to get SQ injections Rx

## 2019-01-17 NOTE — Progress Notes (Signed)
Subjective:    Patient ID: [facility], male    DOB: April 07, 1961, 58 y.o.   MRN: [id number]  HPI  Pt in for about one week.   Pt has sinus congestion, nasal congestion, chest congestion and some st. Pt states his symptoms started around Sunday. Symptoms are getting worse. States getting chest congestion.  He notes he is on humera and tends to end up getting bacterial infection.(points this out various times on exam)  Pt states some bodyaches on Sunday.  Trouble sleeping due to cough.   Pt mentioned some faint costochondral area tightness. No wheezing but used inhaler once when used bronchitis.    Review of Systems  Constitutional: Positive for fatigue. Negative for chills and fever.  HENT: Positive for congestion, ear pain, sinus pressure and sinus pain.   Respiratory: Positive for cough. Negative for chest tightness, shortness of breath and wheezing.   Cardiovascular: Negative for chest pain and palpitations.  Gastrointestinal: Negative for abdominal pain.  Musculoskeletal: Negative for back pain.  Skin: Negative for rash.  Neurological: Negative for dizziness, seizures, syncope, weakness and headaches.  Hematological: Negative for adenopathy. Does not bruise/bleed easily.  Psychiatric/Behavioral: Negative for behavioral problems and confusion.    Past Medical History:  Diagnosis Date  . Crohn's disease without complication ([place]) 01/02/4824  . Diabetes mellitus without complication ([place])   . Dyslipidemia   . History of basal cell carcinoma   . History of melanoma   . History of mood disorder   . Hypertension   . Renal insufficiency      Social History   Socioeconomic History  . Marital status: Married    Spouse name: Not on file  . Number of children: 1  . Years of education: Not on file  . Highest education level: Not on file  Occupational History  . Occupation: Agricultural engineer: [employer]  . Financial resource strain: Not on  file  . Food insecurity:    Worry: Not on file    Inability: Not on file  . Transportation needs:    Medical: Not on file    Non-medical: Not on file  Tobacco Use  . Smoking status: Former Smoker    Types: Cigarettes    Last attempt to quit: 1995    Years since quitting: 25.1  . Smokeless tobacco: Never Used  Substance and Sexual Activity  . Alcohol use: Yes    Comment: occasional-1-2 per week  . Drug use: Never  . Sexual activity: Not on file  Lifestyle  . Physical activity:    Days per week: Not on file    Minutes per session: Not on file  . Stress: Not on file  Relationships  . Social connections:    Talks on phone: Not on file    Gets together: Not on file    Attends religious service: Not on file    Active member of club or organization: Not on file    Attends meetings of clubs or organizations: Not on file    Relationship status: Not on file  . Intimate partner violence:    Fear of current or ex partner: Not on file    Emotionally abused: Not on file    Physically abused: Not on file    Forced sexual activity: Not on file  Other Topics Concern  . 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 COLONOSCOPY    . KNEE ARTHROSCOPY Right 1999  . MELANOMA EXCISION  1999   and squamous and basal cell excisions  . TONSILLECTOMY  1965    Family History  Problem Relation Age of Onset  . Breast cancer Mother   . Cancer Brother        type unknown, mets  . Diabetes Brother   . Crohn's disease Paternal Uncle   . Rheum arthritis Paternal Aunt     Allergies  Allergen Reactions  . Sulfa Antibiotics Anaphylaxis    "I got these purple  spots everywhere"   . Ciprofloxacin   . Flagyl [Metronidazole]     GI effects   . Statins Other (See Comments)    Leg cramps    Current Outpatient Medications on File Prior to Visit  Medication Sig Dispense Refill  . Adalimumab 40 MG/0.8ML PNKT Inject into the skin.    Marland Kitchen amLODipine (NORVASC) 10 MG tablet TAKE 1 TABLET BY MOUTH DAILY 90 tablet 3  . Blood Glucose Monitoring Suppl (FREESTYLE LITE) DEVI Dispense 1 glucose monitoring device.  Use as directed by MD 1 each 0  . Boswellia Serrata (BOSWELLIA PO) Take 1 tablet by mouth 2 (two) times daily.    . budesonide (ENTOCORT EC) 3 MG 24 hr capsule TAKE 1 TO 3 CAPSULES BY MOUTH DAILY AS NEEDED FOR CROHN'S SYMPTOMS 180 capsule 3  . Cholecalciferol (VITAMIN D3 PO) Take by mouth. 2500 iu daily    . ezetimibe (ZETIA) 10 MG tablet TAKE 1 TABLET (10 MG TOTAL) BY MOUTH DAILY. 90 tablet 3  . glucose blood (FREESTYLE LITE) test strip Use to test glucose 1-2x daily 100 each 12  . Insulin Glargine (TOUJEO SOLOSTAR) 300 UNIT/ML SOPN Inject 30 Units into the skin daily. 9 mL 3  . Insulin Pen Needle (PEN NEEDLES) 32G X 6 MM MISC 1 Device by Does not apply route daily. 100 each prn  . MAGNESIUM PO Take 250 mg by mouth daily.    . metFORMIN (GLUCOPHAGE) 500 MG tablet Take 2 tablets (1,000 mg total) by mouth 2 (two) times daily with a meal. 360 tablet 3  . Multiple Vitamin (MULTIVITAMIN) tablet Take 1 tablet by mouth daily.    . Omega-3 Fatty Acids (FISH OIL PO) Take 4 g by mouth daily.    . pioglitazone (ACTOS) 30 MG tablet TAKE 1 TABLET BY MOUTH DAILY 90 tablet 3  . Probiotic Product (VSL#3 PO) Take by mouth as needed.    . ustekinumab (STELARA) 130 MG/26ML SOLN injection Use as directed intravenously 104 mL 0  . ustekinumab (STELARA) 90 MG/ML SOSY injection Inject 1 mL (90 mg total) into the skin every 8 (eight) weeks. 1 Syringe 1  . valsartan-hydrochlorothiazide (DIOVAN-HCT) 320-25 MG tablet TAKE 1 TABLET BY MOUTH DAILY 90 tablet 3  . venlafaxine XR  (EFFEXOR-XR) 37.5 MG 24 hr capsule TAKE 1 CAPSULE BY MOUTH DAILY WITH BREAKFAST 90 capsule 1   Current Facility-Administered Medications on File Prior to Visit  Medication Dose Route Frequency Provider Last Rate Last Dose  . 0.9 %  sodium chloride infusion  500 mL Intravenous Once Gatha Mayer, MD        BP 123/80   Pulse  93   Temp 98.2 F (36.8 C) (Oral)   Resp 16   Ht 5' 11.5" (1.816 m)   Wt 189 lb 9.6 oz (86 kg)   SpO2 98%   BMI 26.08 kg/m       Objective:   Physical Exam  General  Mental Status - Alert. General Appearance - Well groomed. Not in acute distress.  Skin Rashes- No Rashes.  HEENT Head- Normal. Ear Auditory Canal - Left- Normal. Right - Normal.Tympanic Membrane- Left- Normal. Right- Normal. Eye Sclera/Conjunctiva- Left- Normal. Right- Normal. Nose & Sinuses Nasal Mucosa- Left-  Boggy and Congested. Right-  Boggy and  Congested.Bilateral maxillary but less  frontal sinus pressure. Mouth & Throat Lips: Upper Lip- Normal: no dryness, cracking, pallor, cyanosis, or vesicular eruption. Lower Lip-Normal: no dryness, cracking, pallor, cyanosis or vesicular eruption. Buccal Mucosa- Bilateral- No Aphthous ulcers. Oropharynx- No Discharge or Erythema. +pnd. Tonsils: Characteristics- Bilateral-  Erythema. Size/Enlargement- Bilateral- No enlargement. Discharge- bilateral-None.  Neck Neck- Supple. No Masses.   Chest and Lung Exam Auscultation: Breath Sounds:-Clear even and unlabored.  Cardiovascular Auscultation:Rythm- Regular, rate and rhythm. Murmurs & Other Heart Sounds:Ausculatation of the heart reveal- No Murmurs.  Lymphatic Head & Neck General Head & Neck Lymphatics: Bilateral: Description- No Localized lymphadenopathy.  Anterior chest-  No tenderness to palpation.      Assessment & Plan:  You do have probable sinus infection.  Presently your lungs sound clear.  With your illness approaching a week and your use of Humira I do want to treat  you with antibiotic.  Prescribe doxycycline antibiotic.  Rx advisement given.(Prior use in the past and no significant side effects reported.)  For cough, I prescribed Hycodan.  Sedation is side effect.  For nasal congestion and probable eustachian tube pressure, I prescribed Flonase.  You have faint anterior chest wall pressure.  Discussed getting EKG but pt  decided not to get done.  Would watch pressure sensation and see if it develops costochondritis-like.  But if pain worsens indicating cardiac type symptoms and recommend ED evaluation.   Follow-up in 7 to 10 days or as needed.  Note patient did decline getting EGD today.  Not very suspicious symptoms but for caution sake did explain want to get chest x-ray.  Patient did decline.

## 2019-01-17 NOTE — Patient Instructions (Addendum)
You do have probable sinus infection.  Presently your lungs sound clear.  With your illness approaching a week and your use of Humira I do want to treat you with antibiotic.  Prescribe doxycycline antibiotic.  Rx advisement given.(Prior use in the past and no significant side effects reported.)  For cough, I prescribed Hycodan.  Sedation is side effect.  For nasal congestion and probable eustachian tube pressure, I prescribed Flonase.  You have faint anterior chest wall pressure.  Discussed getting EKG  and you decided not to get done.  Would watch pressure sensation and see if it develops costochondritis-like.  But if pain worsens indicating cardiac type symptoms and recommend ED evaluation.   Follow-up in 7 to 10 days or as needed.

## 2019-01-23 ENCOUNTER — Telehealth: Payer: Self-pay | Admitting: Internal Medicine

## 2019-01-23 NOTE — Telephone Encounter (Signed)
Rodney from [employer] called about pt  Ref # [id number]

## 2019-01-23 NOTE — Telephone Encounter (Signed)
This key does not work in cover my meds

## 2019-01-27 MED FILL — TOUJEO SOLOSTAR 300 UNITS/M: 300 | 30 days supply | Qty: 3 | Fill #3

## 2019-01-27 MED FILL — STELARA 130 MG/26ML SOLN: 130 | 30 days supply | Qty: 104 | Fill #0

## 2019-01-28 ENCOUNTER — Telehealth: Payer: Self-pay | Admitting: Internal Medicine

## 2019-01-28 NOTE — Telephone Encounter (Signed)
Mary with [facility] is needing lab results for the pt to start Stelara the results can be faxed to  [phone]

## 2019-01-28 NOTE — Telephone Encounter (Signed)
Last labs sent

## 2019-01-29 DIAGNOSIS — K5090 Crohn's disease, unspecified, without complications: Secondary | ICD-10-CM | POA: Diagnosis not present

## 2019-01-30 ENCOUNTER — Other Ambulatory Visit: Payer: Self-pay

## 2019-02-05 ENCOUNTER — Other Ambulatory Visit: Payer: Self-pay | Admitting: Family Medicine

## 2019-02-05 MED FILL — VALSARTAN-HCTZ 320-25 MG TA: 320-25 | 90 days supply | Qty: 90 | Fill #3

## 2019-02-05 MED FILL — AMLODIPINE BESYLATE 10 MG T: 10 | 90 days supply | Qty: 90 | Fill #3

## 2019-02-07 MED FILL — VENLAFAXINE HCL ER 37.5 MG: 37.5 | 90 days supply | Qty: 90 | Fill #0

## 2019-02-17 ENCOUNTER — Encounter: Payer: Self-pay | Admitting: Family Medicine

## 2019-02-17 MED ORDER — IPRATROPIUM BROMIDE 0.03 % NA SOLN: 2.0000 | Freq: Three times a day (TID) | NASAL | 6 refills | Status: DC

## 2019-02-17 NOTE — Addendum Note (Signed)
Addended by: Lamar Blinks C on: 02/17/2019 07:00 PM   Modules accepted: Orders

## 2019-02-18 MED FILL — IPRATROPIUM 0.03% SPRAY: 0.03 | 30 days supply | Qty: 30 | Fill #0

## 2019-02-26 ENCOUNTER — Other Ambulatory Visit: Payer: Self-pay | Admitting: Family Medicine

## 2019-02-26 MED FILL — TOUJEO SOLOSTAR 300 UNITS/M: 300 | 30 days supply | Qty: 3 | Fill #4

## 2019-02-26 MED FILL — TECHLITE PEN NDL 32GX1/4": 32G X 6 MM | 90 days supply | Qty: 100 | Fill #0

## 2019-02-26 MED FILL — metFORMIN HCL 500 MG TABS: 500 | 90 days supply | Qty: 360 | Fill #2

## 2019-02-26 MED FILL — TECHLITE PEN NDL 32GX1/4: 32G X 6 MM | 90 days supply | Qty: 100 | Fill #0

## 2019-03-04 ENCOUNTER — Encounter: Payer: Self-pay | Admitting: Family Medicine

## 2019-03-06 ENCOUNTER — Other Ambulatory Visit: Payer: Self-pay

## 2019-03-06 ENCOUNTER — Ambulatory Visit (INDEPENDENT_AMBULATORY_CARE_PROVIDER_SITE_OTHER): Payer: 59 | Admitting: Family Medicine

## 2019-03-06 ENCOUNTER — Encounter: Payer: Self-pay | Admitting: Family Medicine

## 2019-03-06 DIAGNOSIS — Z794 Long term (current) use of insulin: Secondary | ICD-10-CM | POA: Diagnosis not present

## 2019-03-06 DIAGNOSIS — E118 Type 2 diabetes mellitus with unspecified complications: Secondary | ICD-10-CM | POA: Diagnosis not present

## 2019-03-06 MED ORDER — DAPAGLIFLOZIN PROPANEDIOL 5 MG PO TABS: 5.0000 mg | ORAL_TABLET | Freq: Every day | ORAL | 3 refills | Status: DC

## 2019-03-06 MED FILL — FARXIGA 5 MG TABLET: 5 | 90 days supply | Qty: 90 | Fill #0

## 2019-03-06 NOTE — Patient Instructions (Addendum)
It was great to talk to you on the phone this afternoon!   Let's change you from metformin to [place]- start with 5 mg, we can go up to 10 mg depending on how you respond over the next month.  Please continue to monitor your blood sugars and let me know how you do  I can send in the 10 mg strength next month if your sugars are consistently higher than 150  Take care, stay safe

## 2019-03-06 NOTE — Progress Notes (Signed)
[place] at [facility] [address] [place], [place] [address] [phone]  Date:  03/06/2019   Name:  Thomas Peterson   DOB:  16-Sep-1961   MRN:  [id number]  PCP:  Darreld Mclean, MD    Chief Complaint: No chief complaint on file.   History of Present Illness:  Thomas Peterson is a 58 y.o. very pleasant male patient who presents with the following:  Phone visit- confirmed pt name and DOB History of DM, crohn's disease, hyperlipidemia Last seen by myself in June for a CPE Last in the office in February when he saw Percell Miller for a sinus infection   He is now on [place] for Crohn disease.  He had been on humira but changed due to continued sx  He does see GI- Dr. Carlean Purl He tried stopping and starting his metformin- it does seem like this is worsening his diarrhea sx, he wondered if we could use a different med  He stopped metformin just about a week ago His home glucose is running just under 200- a bit higher since he stopped metformin   Amlodipine zetia toujeo- he is using 32 units of toujeo, increased from 30 when he stopped metformin Metformin- he was on 1000 BID  actos 30 entocort capsule stelara diovan effexor  Lab Results  Component Value Date   HGBA1C 7.4 (H) 05/15/2018   He is not checking his BP at home  He has an appt for June to follow-up already scheduled   No fever noted  He does not think that he has any sx of COVID 19  Patient Active Problem List   Diagnosis Date Noted  . Long-term use of immunosuppressant medication 09/17/2018  . Low back pain 06/04/2018  . Controlled type 2 diabetes mellitus with complication, with long-term current use of insulin ([place]) 01/29/2017  . Essential hypertension 01/29/2017  . Crohn's ileocolitis ([place]) 01/29/2017  . Mixed hyperlipidemia 01/29/2017  . Leg cramps 01/29/2017  . Medication monitoring encounter 01/29/2017  . History of skin cancer 01/29/2017    Past Medical  History:  Diagnosis Date  . Crohn's disease without complication ([place]) 01/10/4387  . Diabetes mellitus without complication ([place])   . Dyslipidemia   . History of basal cell carcinoma   . History of melanoma   . History of mood disorder   . Hypertension   . Renal insufficiency     Past Surgical History:  Procedure Laterality Date  . COLONOSCOPY    . KNEE ARTHROSCOPY Right 1999  . MELANOMA EXCISION  1999   and squamous and basal cell excisions  . TONSILLECTOMY  1965    Social History   Tobacco Use  . Smoking status: Former Smoker    Types: Cigarettes    Last attempt to quit: 1995    Years since quitting: 25.2  . Smokeless tobacco: Never Used  Substance Use Topics  . Alcohol use: Yes    Comment: occasional-1-2 per week  . Drug use: Never    Family History  Problem Relation Age of Onset  . Breast cancer Mother   . Cancer Brother        type unknown, mets  . Diabetes Brother   . Crohn's disease Paternal Uncle   . Rheum arthritis Paternal Aunt     Allergies  Allergen Reactions  . Sulfa Antibiotics Anaphylaxis    "I got these purple spots everywhere"   . Ciprofloxacin   . Flagyl [Metronidazole]  GI effects   . Statins Other (See Comments)    Leg cramps    Medication list has been reviewed and updated.  Current Outpatient Medications on File Prior to Visit  Medication Sig Dispense Refill  . amLODipine (NORVASC) 10 MG tablet TAKE 1 TABLET BY MOUTH DAILY 90 tablet 3  . Blood Glucose Monitoring Suppl (FREESTYLE LITE) DEVI Dispense 1 glucose monitoring device.  Use as directed by MD 1 each 0  . Boswellia Serrata (BOSWELLIA PO) Take 1 tablet by mouth 2 (two) times daily.    . budesonide (ENTOCORT EC) 3 MG 24 hr capsule TAKE 1 TO 3 CAPSULES BY MOUTH DAILY AS NEEDED FOR CROHN'S SYMPTOMS 180 capsule 3  . Cholecalciferol (VITAMIN D3 PO) Take by mouth. 2500 iu daily    . doxycycline (VIBRA-TABS) 100 MG tablet Take 1 tablet (100 mg total) by mouth 2 (two) times daily.  20 tablet 0  . ezetimibe (ZETIA) 10 MG tablet TAKE 1 TABLET (10 MG TOTAL) BY MOUTH DAILY. 90 tablet 3  . fluticasone (FLONASE) 50 MCG/ACT nasal spray Place 2 sprays into both nostrils daily. 16 g 1  . glucose blood (FREESTYLE LITE) test strip Use to test glucose 1-2x daily 100 each 12  . HYDROcodone-homatropine (HYCODAN) 5-1.5 MG/5ML syrup Take 5 mLs by mouth every 6 (six) hours as needed for cough. 100 mL 0  . Insulin Glargine (TOUJEO SOLOSTAR) 300 UNIT/ML SOPN Inject 30 Units into the skin daily. 9 mL 3  . ipratropium (ATROVENT) 0.03 % nasal spray Place 2 sprays into the nose 3 (three) times daily. Use as needed for nasal drainage 30 mL 6  . MAGNESIUM PO Take 250 mg by mouth daily.    . metFORMIN (GLUCOPHAGE) 500 MG tablet Take 2 tablets (1,000 mg total) by mouth 2 (two) times daily with a meal. 360 tablet 3  . Multiple Vitamin (MULTIVITAMIN) tablet Take 1 tablet by mouth daily.    . Omega-3 Fatty Acids (FISH OIL PO) Take 4 g by mouth daily.    . pioglitazone (ACTOS) 30 MG tablet TAKE 1 TABLET BY MOUTH DAILY 90 tablet 3  . Probiotic Product (VSL#3 PO) Take by mouth as needed.    . TECHLITE PEN NEEDLES 32G X 6 MM MISC USE ONCE DAILY 100 each PRN  . ustekinumab (STELARA) 130 MG/26ML SOLN injection Use as directed intravenously 104 mL 0  . ustekinumab (STELARA) 90 MG/ML SOSY injection Inject 1 mL (90 mg total) into the skin every 8 (eight) weeks. 1 Syringe 3  . valsartan-hydrochlorothiazide (DIOVAN-HCT) 320-25 MG tablet TAKE 1 TABLET BY MOUTH DAILY 90 tablet 3  . venlafaxine XR (EFFEXOR-XR) 37.5 MG 24 hr capsule TAKE 1 CAPSULE BY MOUTH DAILY WITH BREAKFAST 90 capsule 0   Current Facility-Administered Medications on File Prior to Visit  Medication Dose Route Frequency Provider Last Rate Last Dose  . 0.9 %  sodium chloride infusion  500 mL Intravenous Once Gatha Mayer, MD        Review of Systems:  As per HPI- otherwise negative.   Physical Examination: There were no vitals filed for  this visit. There were no vitals filed for this visit. There is no height or weight on file to calculate BMI. Ideal Body Weight:    Sounds well, no SOB or tachypnea apparent  No VS available   Assessment and Plan: Controlled type 2 diabetes mellitus with complication, with long-term current use of insulin (HCC) - Plan: dapagliflozin propanediol (FARXIGA) 5 MG TABS tablet  He  has stopped metformin due to SE and possible worsening of his Crohn disease sx Will have his start on farxiga instead.  Start at 5 mg and will adjust up as needed He will keep me closely apprised of his progress - sent mychart message to pt so we can follow-up  Spoke for pt for 8 minutes per my phone timer  Signed Lamar Blinks, MD

## 2019-03-24 ENCOUNTER — Encounter: Payer: Self-pay | Admitting: Family Medicine

## 2019-03-24 MED FILL — POLYMYXIN B/TMP EYE DROPS: 10000-0.1 | 13 days supply | Qty: 10 | Fill #0

## 2019-03-24 NOTE — Telephone Encounter (Signed)
FYI

## 2019-03-26 ENCOUNTER — Telehealth: Payer: Self-pay | Admitting: Internal Medicine

## 2019-03-26 MED FILL — TOUJEO SOLOSTAR 300 UNITS/M: 300 | 30 days supply | Qty: 3 | Fill #5

## 2019-03-26 MED FILL — STELARA 90 MG/ML SOSY: 90 | 56 days supply | Qty: 1 | Fill #0

## 2019-03-26 NOTE — Telephone Encounter (Signed)
Patient notified that the Stelara dosage is 68m. All questions answered.

## 2019-04-11 IMAGING — DX DG LUMBAR SPINE 2-3V
3 series · 3 of 3 positions shown · non-contrast
Comparison: None.

CLINICAL DATA: Chronic left low back pain for 5 years.

EXAM:
LUMBAR SPINE - 2-3 VIEW

[l-spine ap]
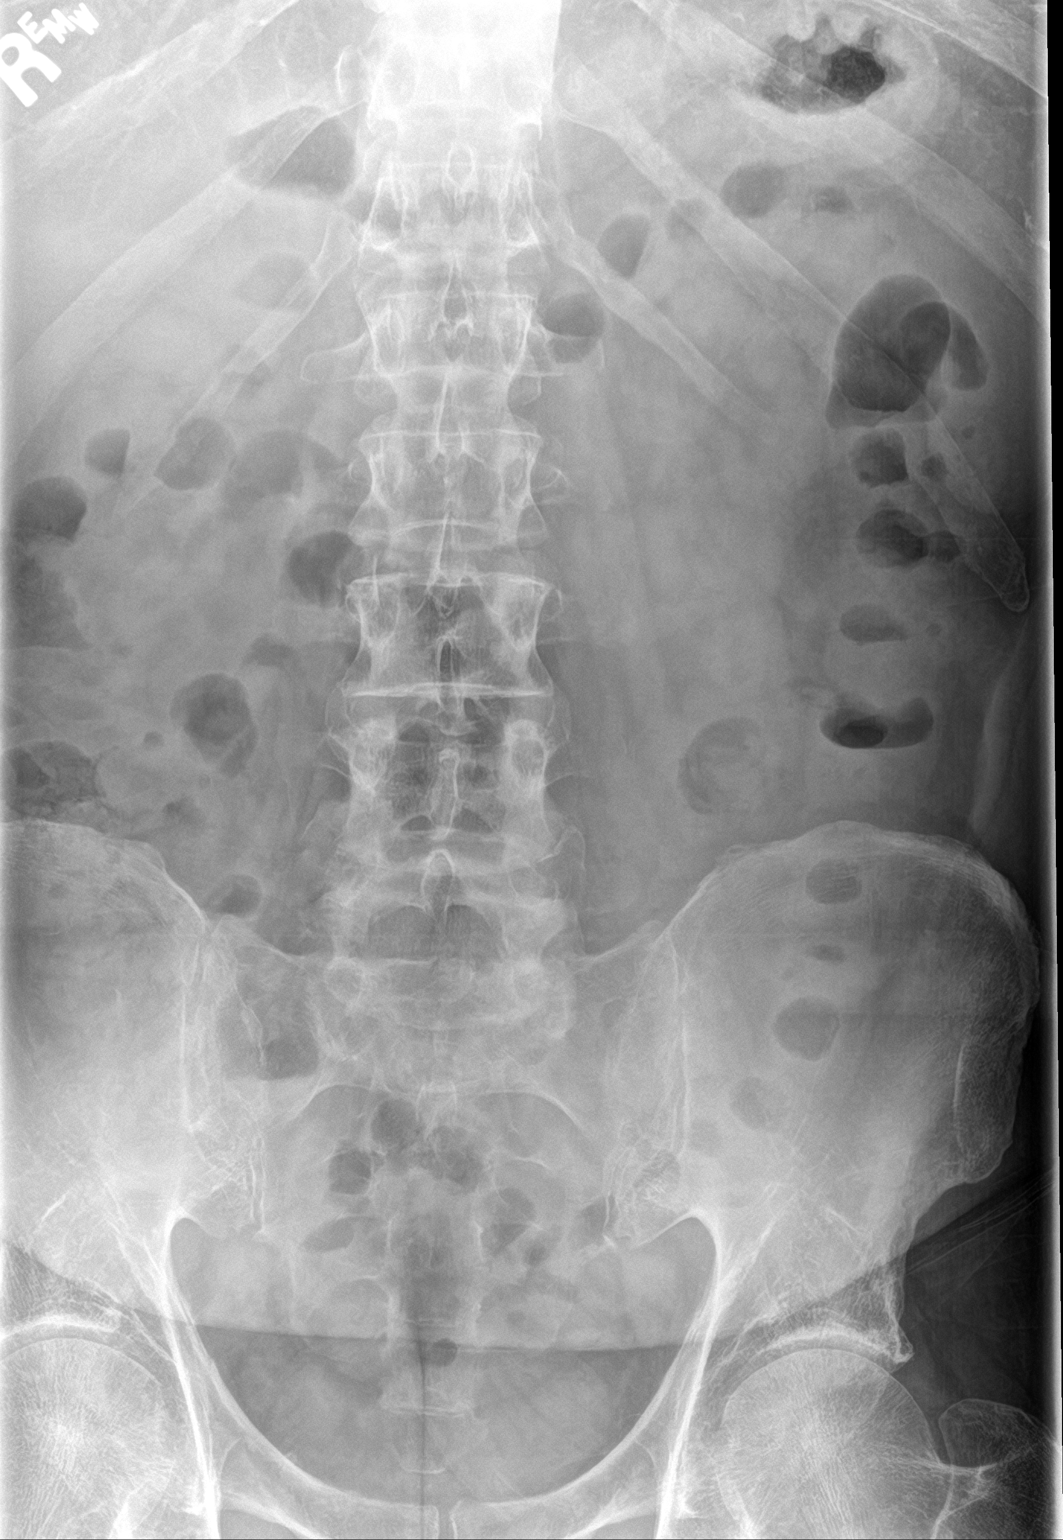

[l-spine lat]
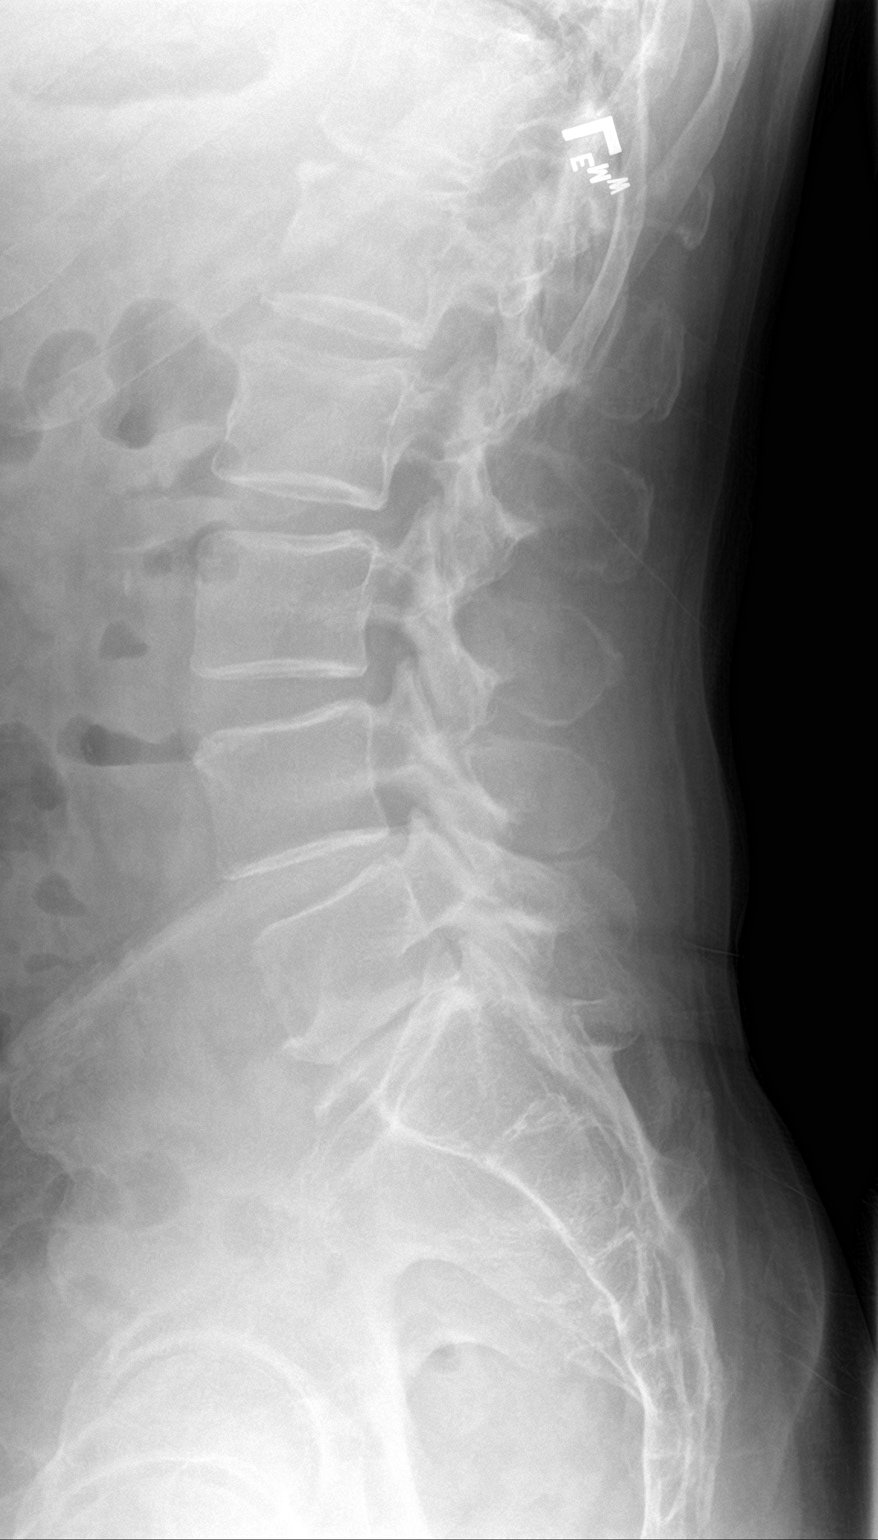

[l-spine spot]
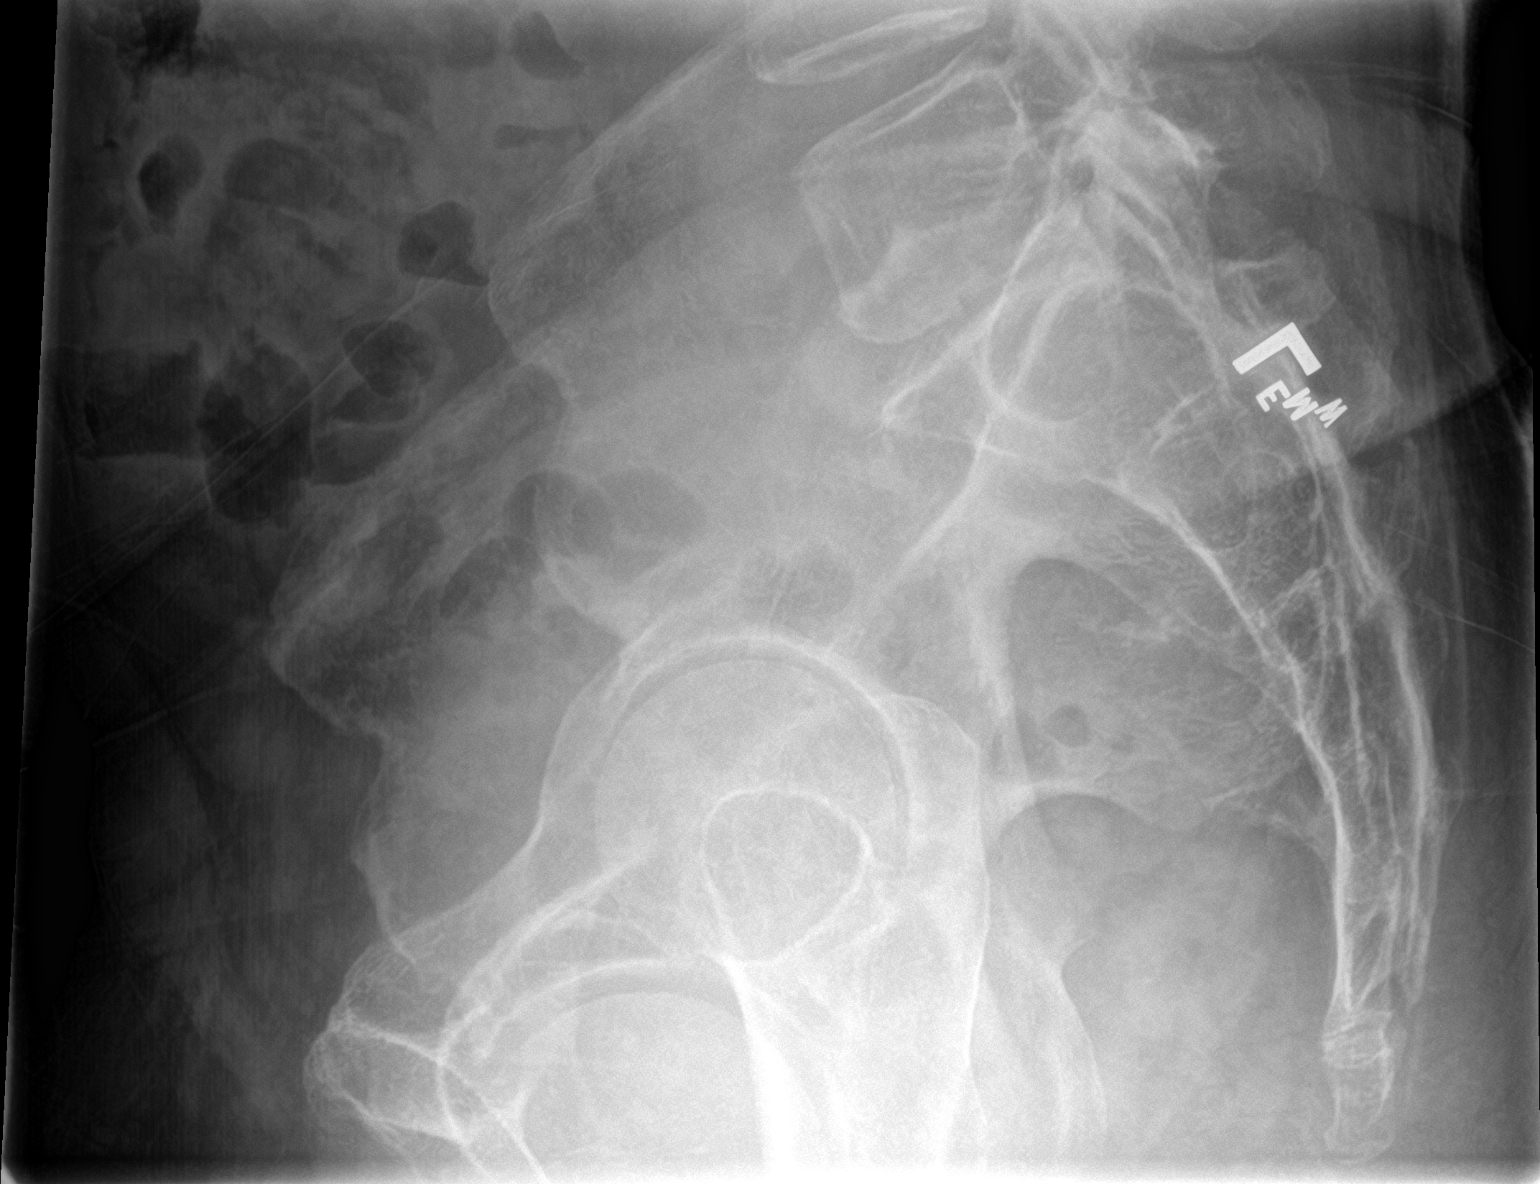

[3 of 3 positions shown; findings below may reference images not displayed]

FINDINGS: There are 5 non rib-bearing lumbar type vertebrae. Vertebral
alignment is normal. No fracture is identified. There is moderate to
severe disc space narrowing at L5-S1 with associated degenerative
endplate sclerosis and spurring. Disc space heights are preserved
elsewhere. The soft tissues are unremarkable.
IMPRESSION: Advanced single level disc degeneration at L5-S1.

## 2019-04-28 ENCOUNTER — Other Ambulatory Visit: Payer: Self-pay | Admitting: Family Medicine

## 2019-04-28 ENCOUNTER — Encounter: Payer: Self-pay | Admitting: Family Medicine

## 2019-04-28 DIAGNOSIS — Z794 Long term (current) use of insulin: Secondary | ICD-10-CM

## 2019-04-28 DIAGNOSIS — I10 Essential (primary) hypertension: Secondary | ICD-10-CM

## 2019-04-28 DIAGNOSIS — E782 Mixed hyperlipidemia: Secondary | ICD-10-CM

## 2019-04-28 DIAGNOSIS — E118 Type 2 diabetes mellitus with unspecified complications: Secondary | ICD-10-CM

## 2019-04-28 MED ORDER — PIOGLITAZONE HCL 30 MG PO TABS: 30.0000 mg | ORAL_TABLET | Freq: Every day | ORAL | 1 refills | Status: DC

## 2019-04-28 MED ORDER — EZETIMIBE 10 MG PO TABS: 10.0000 mg | ORAL_TABLET | Freq: Every day | ORAL | 1 refills | Status: DC

## 2019-04-28 MED ORDER — AMLODIPINE BESYLATE 10 MG PO TABS: 10.0000 mg | ORAL_TABLET | Freq: Every day | ORAL | 1 refills | Status: DC

## 2019-04-28 MED ORDER — VENLAFAXINE HCL ER 37.5 MG PO CP24: ORAL_CAPSULE | ORAL | 1 refills | Status: DC

## 2019-04-28 MED ORDER — VALSARTAN-HYDROCHLOROTHIAZIDE 320-25 MG PO TABS: 1.0000 | ORAL_TABLET | Freq: Every day | ORAL | 1 refills | Status: DC

## 2019-04-28 MED FILL — PIOGLITAZONE HCL 30 MG TAB: 30 | 90 days supply | Qty: 90 | Fill #0

## 2019-04-28 MED FILL — EZETIMIBE 10 MG TABS: 10 | 90 days supply | Qty: 90 | Fill #0

## 2019-04-28 MED FILL — TOUJEO SOLOSTAR 300 UNITS/M: 300 | 30 days supply | Qty: 3 | Fill #6

## 2019-04-28 MED FILL — VALSARTAN-HCTZ 320-25 MG TA: 320-25 | 90 days supply | Qty: 90 | Fill #0

## 2019-04-28 MED FILL — AMLODIPINE BESYLATE 10 MG T: 10 | 90 days supply | Qty: 90 | Fill #0

## 2019-04-28 MED FILL — VENLAFAXINE HCL ER 37.5 MG: 37.5 | 90 days supply | Qty: 90 | Fill #0

## 2019-04-29 ENCOUNTER — Telehealth: Payer: Self-pay | Admitting: Internal Medicine

## 2019-04-29 DIAGNOSIS — K5090 Crohn's disease, unspecified, without complications: Secondary | ICD-10-CM

## 2019-04-29 MED ORDER — BUDESONIDE 3 MG PO CPEP: ORAL_CAPSULE | ORAL | 2 refills | Status: DC

## 2019-04-29 MED FILL — BUDESONIDE 3 MG CPEP: 3 | 30 days supply | Qty: 90 | Fill #0

## 2019-04-29 NOTE — Telephone Encounter (Signed)
Patient informed that budesonide refilled.

## 2019-04-29 NOTE — Telephone Encounter (Signed)
May I refill Sir?

## 2019-04-29 NOTE — Telephone Encounter (Signed)
Pt requested a refill for budesonide sent to [facility].

## 2019-04-29 NOTE — Telephone Encounter (Signed)
#   90 w/ 2 refills

## 2019-05-09 ENCOUNTER — Ambulatory Visit (INDEPENDENT_AMBULATORY_CARE_PROVIDER_SITE_OTHER): Payer: 59 | Admitting: Internal Medicine

## 2019-05-09 ENCOUNTER — Encounter: Payer: Self-pay | Admitting: Internal Medicine

## 2019-05-09 ENCOUNTER — Other Ambulatory Visit: Payer: Self-pay

## 2019-05-09 DIAGNOSIS — K50819 Crohn's disease of both small and large intestine with unspecified complications: Secondary | ICD-10-CM | POA: Diagnosis not present

## 2019-05-09 NOTE — Patient Instructions (Signed)
As we discussed, but stick with the Stelara for a while longer, and stay on budesonide 9 mg daily for 2 months.  At the end of those 2 months see what happens, as you are off the budesonide.  I would like to hear from you by August.  Sooner if needed.  I appreciate the opportunity to care for you. Gatha Mayer, MD, [facility]

## 2019-05-09 NOTE — Progress Notes (Signed)
TELEHEALTH ENCOUNTER IN SETTING OF COVID-19 PANDEMIC - REQUESTED BY PATIENT SERVICE PROVIDED BY TELEMEDECINE - TYPE: Telephone, unable to connect AV PATIENT LOCATION: Home PATIENT HAS CONSENTED TO TELEHEALTH VISIT PROVIDER LOCATION: OFFICE REFERRING PROVIDER: Not applicable PARTICIPANTS OTHER THAN PATIENT: None TIME SPENT ON CALL: 13 minutes    Thomas Peterson 58 y.o. [birth year] [id number]  Assessment & Plan:  Crohn's ileocolitis ([place]) After his infusion and first subcutaneous injection of Stelara he has not noted much help.  I think it is too soon to say that he has failed Stelara.  I have asked him to stay on budesonide 9 mg daily for 2 months straight to try to help induce a remission, then observe on the Stelara and to regroup with me around August for a symptom update.  He may always contact me sooner if needed.  He has been on Remicade, and Humira as well as 6-MP in the past.  He was a low producer of 6-TG with 6-MP despite having a good response prior to that, when that was discovered he was not doing well and allopurinol was added to increase his 6-TG levels but he did not seem to respond.  If we decide Stelara has not worked, then I am inclined to try Entyvio and he could have at home infusions for that which would be more convenient.  May need to do the first infusion in a facility.      Subjective:   Chief Complaint: Crohn's disease  HPI Thomas Peterson messaged me the other week saying that he did not think Stelara was helping him, he was still having cramps and diarrhea.  He has had 1 infusion the loading dose and then 1 subcutaneous injection.  He was using his budesonide intermittently with some relief.  Since that time I suggested he start budesonide back and he has any feels better.  He is concerned though that the Delsa Grana is not going to work.  He has previously been on 6-MP Remicade Humira as treatments.  Some of those initially provided relief but then he had problems.   Budesonide has always been fairly reliable in relieving symptoms.  Colonoscopy in 10/30/2018 did not reveal any ileal disease though he had patchy aphthous ulceration and inflammatory bowel disease findings throughout the colon.  Very mild.  He also had a negative CT enterography. Allergies  Allergen Reactions  . Sulfa Antibiotics Anaphylaxis    "I got these purple spots everywhere"   . Ciprofloxacin   . Flagyl [Metronidazole]     GI effects   . Metformin And Related     Worsens his crohn disease sx, diarrhea   . Statins Other (See Comments)    Leg cramps   Current Meds  Medication Sig  . amLODipine (NORVASC) 10 MG tablet Take 1 tablet (10 mg total) by mouth daily.  . Blood Glucose Monitoring Suppl (FREESTYLE LITE) DEVI Dispense 1 glucose monitoring device.  Use as directed by MD  . Steva Colder (BOSWELLIA PO) Take 1 tablet by mouth 2 (two) times daily.  . budesonide (ENTOCORT EC) 3 MG 24 hr capsule TAKE 1 TO 3 CAPSULES BY MOUTH DAILY AS NEEDED FOR CROHN'S SYMPTOMS  . Cholecalciferol (VITAMIN D3 PO) Take by mouth. 2500 iu daily  . dapagliflozin propanediol (FARXIGA) 5 MG TABS tablet Take 5 mg by mouth daily.  Marland Kitchen ezetimibe (ZETIA) 10 MG tablet TAKE 1 TABLET (10 MG TOTAL) BY MOUTH DAILY.  Marland Kitchen glucose blood (FREESTYLE LITE) test strip Use to test glucose 1-2x  daily  . Insulin Glargine (TOUJEO SOLOSTAR) 300 UNIT/ML SOPN Inject 30 Units into the skin daily.  Marland Kitchen ipratropium (ATROVENT) 0.03 % nasal spray Place 1 spray into both nostrils daily.  . Multiple Vitamin (MULTIVITAMIN) tablet Take 1 tablet by mouth daily.  . Omega-3 Fatty Acids (FISH OIL PO) Take 4 g by mouth daily.  . pioglitazone (ACTOS) 30 MG tablet Take 1 tablet (30 mg total) by mouth daily.  . Probiotic Product (VSL#3 PO) Take by mouth as needed.  . TECHLITE PEN NEEDLES 32G X 6 MM MISC USE ONCE DAILY  . ustekinumab (STELARA) 130 MG/26ML SOLN injection Use as directed intravenously  . ustekinumab (STELARA) 90 MG/ML SOSY  injection Inject 1 mL (90 mg total) into the skin every 8 (eight) weeks.  . valsartan-hydrochlorothiazide (DIOVAN-HCT) 320-25 MG tablet Take 1 tablet by mouth daily.  Marland Kitchen venlafaxine XR (EFFEXOR-XR) 37.5 MG 24 hr capsule TAKE 1 CAPSULE BY MOUTH DAILY WITH BREAKFAST   Past Medical History:  Diagnosis Date  . Crohn's disease without complication ([place]) 0/32/1224  . Diabetes mellitus without complication ([place])   . Dyslipidemia   . History of basal cell carcinoma   . History of melanoma   . History of mood disorder   . Hypertension   . Renal insufficiency    Past Surgical History:  Procedure Laterality Date  . COLONOSCOPY    . KNEE ARTHROSCOPY Right 1999  . MELANOMA EXCISION  1999   and squamous and basal cell excisions  . 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   Review of Systems As per HPI

## 2019-05-09 NOTE — Assessment & Plan Note (Signed)
After his infusion and first subcutaneous injection of Stelara he has not noted much help.  I think it is too soon to say that he has failed Stelara.  I have asked him to stay on budesonide 9 mg daily for 2 months straight to try to help induce a remission, then observe on the Stelara and to regroup with me around August for a symptom update.  He may always contact me sooner if needed.  He has been on Remicade, and Humira as well as 6-MP in the past.  He was a low producer of 6-TG with 6-MP despite having a good response prior to that, when that was discovered he was not doing well and allopurinol was added to increase his 6-TG levels but he did not seem to respond.  If we decide Stelara has not worked, then I am inclined to try Entyvio and he could have at home infusions for that which would be more convenient.  May need to do the first infusion in a facility.

## 2019-05-16 NOTE — Patient Instructions (Addendum)
It was great to see you in the office today, I will be in touch with your labs ASAP I will rx voltaren gel for your thumb- let me know if you do want to see one of the hand surgeons later on this year.    Let's plan to do the shingles vaccine at some pont- perhaps once you are off Entocort later this year  We can increase your farxiga dose if needed- will see how your A1c looks today Let's plan to visit in about 6 months assuming all is well    Health Maintenance, Male A healthy lifestyle and preventive care is important for your health and wellness. Ask your health care provider about what schedule of regular examinations is right for you. What should I know about weight and diet? Eat a Healthy Diet  Eat plenty of vegetables, fruits, whole grains, low-fat dairy products, and lean protein.  Do not eat a lot of foods high in solid fats, added sugars, or salt.  Maintain a Healthy Weight Regular exercise can help you achieve or maintain a healthy weight. You should:  Do at least 150 minutes of exercise each week. The exercise should increase your heart rate and make you sweat (moderate-intensity exercise).  Do strength-training exercises at least twice a week. Watch Your Levels of Cholesterol and Blood Lipids  Have your blood tested for lipids and cholesterol every 5 years starting at 60 years of age. If you are at high risk for heart disease, you should start having your blood tested when you are 60 years old. You may need to have your cholesterol levels checked more often if: ? Your lipid or cholesterol levels are high. ? You are older than 60 years of age. ? You are at high risk for heart disease. What should I know about cancer screening? Many types of cancers can be detected early and may often be prevented. Lung Cancer  You should be screened every year for lung cancer if: ? You are a current smoker who has smoked for at least 30 years. ? You are a former smoker who has quit  within the past 15 years.  Talk to your health care provider about your screening options, when you should start screening, and how often you should be screened. Colorectal Cancer  Routine colorectal cancer screening usually begins at 60 years of age and should be repeated every 5-10 years until you are 60 years old. You may need to be screened more often if early forms of precancerous polyps or small growths are found. Your health care provider may recommend screening at an earlier age if you have risk factors for colon cancer.  Your health care provider may recommend using home test kits to check for hidden blood in the stool.  A small camera at the end of a tube can be used to examine your colon (sigmoidoscopy or colonoscopy). This checks for the earliest forms of colorectal cancer. Prostate and Testicular Cancer  Depending on your age and overall health, your health care provider may do certain tests to screen for prostate and testicular cancer.  Talk to your health care provider about any symptoms or concerns you have about testicular or prostate cancer. Skin Cancer  Check your skin from head to toe regularly.  Tell your health care provider about any new moles or changes in moles, especially if: ? There is a change in a mole's size, shape, or color. ? You have a mole that is larger than a  pencil eraser.  Always use sunscreen. Apply sunscreen liberally and repeat throughout the day.  Protect yourself by wearing long sleeves, pants, a wide-brimmed hat, and sunglasses when outside. What should I know about heart disease, diabetes, and high blood pressure?  If you are 27-59 years of age, have your blood pressure checked every 3-5 years. If you are 33 years of age or older, have your blood pressure checked every year. You should have your blood pressure measured twice-once when you are at a hospital or clinic, and once when you are not at a hospital or clinic. Record the average of the  two measurements. To check your blood pressure when you are not at a hospital or clinic, you can use: ? An automated blood pressure machine at a pharmacy. ? A home blood pressure monitor.  Talk to your health care provider about your target blood pressure.  If you are between 3-56 years old, ask your health care provider if you should take aspirin to prevent heart disease.  Have regular diabetes screenings by checking your fasting blood sugar level. ? If you are at a normal weight and have a low risk for diabetes, have this test once every three years after the age of 56. ? If you are overweight and have a high risk for diabetes, consider being tested at a younger age or more often.  A one-time screening for abdominal aortic aneurysm (AAA) by ultrasound is recommended for men aged 53-75 years who are current or former smokers. What should I know about preventing infection? Hepatitis B If you have a higher risk for hepatitis B, you should be screened for this virus. Talk with your health care provider to find out if you are at risk for hepatitis B infection. Hepatitis C Blood testing is recommended for:  Everyone born from [birth year] through 1967.  Anyone with known risk factors for hepatitis C. Sexually Transmitted Diseases (STDs)  You should be screened each year for STDs including gonorrhea and chlamydia if: ? You are sexually active and are younger than 60 years of age. ? You are older than 60 years of age and your health care provider tells you that you are at risk for this type of infection. ? Your sexual activity has changed since you were last screened and you are at an increased risk for chlamydia or gonorrhea. Ask your health care provider if you are at risk.  Talk with your health care provider about whether you are at high risk of being infected with HIV. Your health care provider may recommend a prescription medicine to help prevent HIV infection. What else can I do?  Schedule  regular health, dental, and eye exams.  Stay current with your vaccines (immunizations).  Do not use any tobacco products, such as cigarettes, chewing tobacco, and e-cigarettes. If you need help quitting, ask your health care provider.  Limit alcohol intake to no more than 2 drinks per day. One drink equals 12 ounces of beer, 5 ounces of wine, or 1 ounces of hard liquor.  Do not use street drugs.  Do not share needles.  Ask your health care provider for help if you need support or information about quitting drugs.  Tell your health care provider if you often feel depressed.  Tell your health care provider if you have ever been abused or do not feel safe at home. This information is not intended to replace advice given to you by your health care provider. Make sure you discuss any  questions you have with your health care provider. Document Released: 05/25/2008 Document Revised: 07/26/2016 Document Reviewed: 08/31/2015 Elsevier Interactive Patient Education  2019 [employer].

## 2019-05-16 NOTE — Progress Notes (Addendum)
[place] at [facility] [address] [place], [place] [address] [phone]  Date:  05/19/2019   Name:  Thomas Peterson   DOB:  07-Sep-1961   MRN:  [id number]  PCP:  Darreld Mclean, MD    Chief Complaint: Annual Exam   History of Present Illness:  Thomas Peterson is a 58 y.o. 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 Carlean Purl.  He recently changed from Humira to Stelara due to continued Crohn's disease symptoms.  Unfortunately this is not working too well for his Crohn's as of yet.  They hope it will start to work better for him with continued use. He is using Entocort by mouth regularly right now to control symptoms We also stopped metformin as it seemed to worsen diarrhea-I put him on farxiga in March when we stopped metformin.  He is tolerating farxiga fine, wonders if we will need to increase the dose  His home glucose is running a little high- might be 140- 150 fasting  Rarely running too low.  However last night he felt like he had hypoglycemia and drank some juice -he did not actually check his sugar however  He notes that he was sick in February.  He went to a conference in [place]- on a plane.  He got what he thought was a "bad head cold" during the conference.  He is taking zyrtec   He is not doing a lot of exercise.  His gym is closed so this is holding him back   Labs: Due for complete, HIV screening Eye exam: he is due, will take care of soon Foot exam is due Immunizations: Shingrix?  Appears to be okay with Stelara-however not with Entocort Colonoscopy: Done November 2019, up-to-date  Amlodipine 10 Valsartan/HCTZ Toujeo  30 units Farxiga 5 Actos 30 Effexor Zetia Stelara  Lab Results  Component Value Date   HGBA1C 7.4 (H) 05/15/2018   Lab Results  Component Value Date   PSA 1.61 11/14/2018   PSA 1.43 05/15/2018   PSA 1.05 01/29/2017    Patient Active Problem List   Diagnosis Date Noted  . Long-term use of immunosuppressant medication 09/17/2018  . Low back pain 06/04/2018  . Controlled type 2 diabetes mellitus with complication, with long-term current use of insulin ([place]) 01/29/2017  . Essential hypertension 01/29/2017  . Crohn's ileocolitis ([place]) 01/29/2017  . Mixed hyperlipidemia 01/29/2017  . Leg cramps 01/29/2017  . History of skin cancer 01/29/2017    Past Medical History:  Diagnosis Date  . Crohn's disease without complication ([place]) 4/65/0354  . Diabetes mellitus without complication ([place])   . Dyslipidemia   . History of basal cell carcinoma   . History of melanoma   . History of mood disorder   . Hypertension   . Renal insufficiency     Past Surgical History:  Procedure Laterality Date  . COLONOSCOPY    . KNEE ARTHROSCOPY Right 1999  . MELANOMA EXCISION  1999   and squamous and basal cell excisions  . TONSILLECTOMY  1965    Social History   Tobacco Use  . Smoking status: Former Smoker    Types: Cigarettes    Last attempt to quit: 1995  Years since quitting: 25.4  . Smokeless tobacco: Never Used  Substance Use Topics  . Alcohol use: Yes    Comment: occasional-1-2 per week  . Drug use: Never    Family History  Problem Relation Age of Onset  . Breast cancer Mother   . Heart attack Mother   . Cancer Brother        type unknown, mets  . Diabetes Brother   . Crohn's disease Paternal Uncle   . Rheum arthritis Paternal Aunt     Allergies  Allergen Reactions  . Sulfa Antibiotics Anaphylaxis    "I got these purple spots everywhere"   . Ciprofloxacin   . Flagyl [Metronidazole]     GI effects   . Metformin And Related     Worsens his crohn disease sx,  diarrhea   . Statins Other (See Comments)    Leg cramps    Medication list has been reviewed and updated.  Current Outpatient Medications on File Prior to Visit  Medication Sig Dispense Refill  . amLODipine (NORVASC) 10 MG tablet Take 1 tablet (10 mg total) by mouth daily. 90 tablet 1  . Blood Glucose Monitoring Suppl (FREESTYLE LITE) DEVI Dispense 1 glucose monitoring device.  Use as directed by MD 1 each 0  . Boswellia Serrata (BOSWELLIA PO) Take 1 tablet by mouth 2 (two) times daily.    . budesonide (ENTOCORT EC) 3 MG 24 hr capsule TAKE 1 TO 3 CAPSULES BY MOUTH DAILY AS NEEDED FOR CROHN'S SYMPTOMS 90 capsule 2  . Cholecalciferol (VITAMIN D3 PO) Take by mouth. 2500 iu daily    . dapagliflozin propanediol (FARXIGA) 5 MG TABS tablet Take 5 mg by mouth daily. 30 tablet 3  . ezetimibe (ZETIA) 10 MG tablet TAKE 1 TABLET (10 MG TOTAL) BY MOUTH DAILY. 90 tablet 3  . glucose blood (FREESTYLE LITE) test strip Use to test glucose 1-2x daily 100 each 12  . Insulin Glargine (TOUJEO SOLOSTAR) 300 UNIT/ML SOPN Inject 30 Units into the skin daily. 9 mL 3  . ipratropium (ATROVENT) 0.03 % nasal spray Place 1 spray into both nostrils daily.    . Multiple Vitamin (MULTIVITAMIN) tablet Take 1 tablet by mouth daily.    . Omega-3 Fatty Acids (FISH OIL PO) Take 4 g by mouth daily.    . Probiotic Product (VSL#3 PO) Take by mouth as needed.    . TECHLITE PEN NEEDLES 32G X 6 MM MISC USE ONCE DAILY 100 each PRN  . ustekinumab (STELARA) 90 MG/ML SOSY injection Inject 1 mL (90 mg total) into the skin every 8 (eight) weeks. 1 Syringe 3  . valsartan-hydrochlorothiazide (DIOVAN-HCT) 320-25 MG tablet Take 1 tablet by mouth daily. 90 tablet 1  . venlafaxine XR (EFFEXOR-XR) 37.5 MG 24 hr capsule TAKE 1 CAPSULE BY MOUTH DAILY WITH BREAKFAST 90 capsule 1  . pioglitazone (ACTOS) 30 MG tablet Take 1 tablet (30 mg total) by mouth daily. 90 tablet 1   No current facility-administered medications on file prior to visit.      Review of Systems:  As per HPI- otherwise negative.  No fever or chills, no chest pain or shortness of breath No urinary or genital concerns He sees a dermatologist He may have some leg and calf cramping at night sometimes.  He has had this before Physical Examination: Vitals:   05/19/19 0811  BP: 120/68  Pulse: 84  Temp: 98.6 F (37 C)  SpO2: 93%   Vitals:   05/19/19 0811  Weight: 178 lb  8 oz (81 kg)  Height: 6' (1.829 m)   Body mass index is 24.21 kg/m. Ideal Body Weight: Weight in (lb) to have BMI = 25: 183.9  GEN: WDWN, NAD, Non-toxic, A & O x 3, looks well today. Normal weight HEENT: Atraumatic, Normocephalic. Neck supple. No masses, No LAD.  Bilateral TM wnl, oropharynx normal.  PEERL,EOMI.   Ears and Nose: No external deformity. CV: RRR, No M/G/R. No JVD. No thrill. No extra heart sounds. PULM: CTA B, no wheezes, crackles, rhonchi. No retractions. No resp. distress. No accessory muscle use. ABD: S, NT, ND. No rebound. No HSM. EXTR: No c/c/e NEURO Normal gait.  PSYCH: Normally interactive. Conversant. Not depressed or anxious appearing.  Calm demeanor.  He has noted some pain and stiffness in the left first MCP joint.  He also has developed a nodule in the palm of the hand, likely along the flexor tendon of the long finger.  This can be a little bit uncomfortable with direct pressure but not painful  Assessment and Plan: Physical exam  Mixed hyperlipidemia - Plan: Lipid panel  Controlled type 2 diabetes mellitus with complication, with long-term current use of insulin ([place]) - Plan: Comprehensive metabolic panel, Hemoglobin A1c, Microalbumin / creatinine urine ratio  Essential hypertension - Plan: CBC, Comprehensive metabolic panel  Screening for prostate cancer - Plan: PSA  Long-term use of immunosuppressant medication  Crohn's disease of small and large intestines with complication (HCC)  Leg cramps - Plan: Comprehensive metabolic panel,  Ferritin  Left hand pain - Plan: diclofenac sodium (VOLTAREN) 1 % GEL  Following up today for physical exam.  Labs pending as above Will plan further follow- up pending labs. Can increase Farxiga to 10 mg if needed Prescribe diclofenac gel for him to use on his thumb for likely arthritis.  Offered referral to hand surgery for the nodule on his palm, he will let me know if he would like to do this later  Signed Lamar Blinks, MD  Addendum 6/, repeat labs as below.  Message to patient  Results for orders placed or performed in visit on 05/19/19  CBC  Result Value Ref Range   WBC 7.2 4.0 - 10.5 K/uL   RBC 4.95 4.22 - 5.81 Mil/uL   Platelets 402.0 (H) 150.0 - 400.0 K/uL   Hemoglobin 15.4 13.0 - 17.0 g/dL   HCT 45.4 39.0 - 52.0 %   MCV 91.9 78.0 - 100.0 fl   MCHC 33.9 30.0 - 36.0 g/dL   RDW 14.8 11.5 - 15.5 %  Comprehensive metabolic panel  Result Value Ref Range   Sodium 141 135 - 145 mEq/L   Potassium 4.0 3.5 - 5.1 mEq/L   Chloride 100 96 - 112 mEq/L   CO2 33 (H) 19 - 32 mEq/L   Glucose, Bld 103 (H) 70 - 99 mg/dL   BUN 20 6 - 23 mg/dL   Creatinine, Ser 1.31 0.40 - 1.50 mg/dL   Total Bilirubin 0.6 0.2 - 1.2 mg/dL   Alkaline Phosphatase 45 39 - 117 U/L   AST 16 0 - 37 U/L   ALT 12 0 - 53 U/L   Total Protein 7.0 6.0 - 8.3 g/dL   Albumin 4.2 3.5 - 5.2 g/dL   Calcium 9.2 8.4 - 10.5 mg/dL   GFR 55.77 (L) >60.00 mL/min  Hemoglobin A1c  Result Value Ref Range   Hgb A1c MFr Bld 7.9 (H) 4.6 - 6.5 %  Lipid panel  Result Value Ref Range   Cholesterol  200 0 - 200 mg/dL   Triglycerides 197.0 (H) 0.0 - 149.0 mg/dL   HDL 42.40 >39.00 mg/dL   VLDL 39.4 0.0 - 40.0 mg/dL   LDL Cholesterol 118 (H) 0 - 99 mg/dL   Total CHOL/HDL Ratio 5    NonHDL 157.30   PSA  Result Value Ref Range   PSA 1.30 0.10 - 4.00 ng/mL  Microalbumin / creatinine urine ratio  Result Value Ref Range   Microalb, Ur 3.7 (H) 0.0 - 1.9 mg/dL   Creatinine,U 144.7 mg/dL   Microalb Creat Ratio 2.5 0.0 - 30.0  mg/g  Ferritin  Result Value Ref Range   Ferritin 36.0 22.0 - 322.0 ng/mL   A1c has gone up slightly from 7.4% Lab Results  Component Value Date   PSA 1.30 05/19/2019   PSA 1.61 11/14/2018   PSA 1.43 05/15/2018

## 2019-05-19 ENCOUNTER — Other Ambulatory Visit: Payer: Self-pay

## 2019-05-19 ENCOUNTER — Ambulatory Visit (INDEPENDENT_AMBULATORY_CARE_PROVIDER_SITE_OTHER): Payer: 59 | Admitting: Family Medicine

## 2019-05-19 ENCOUNTER — Encounter: Payer: Self-pay | Admitting: Family Medicine

## 2019-05-19 VITALS — BP 120/68 | HR 84 | Temp 98.6°F | Ht 72.0 in | Wt 178.5 lb

## 2019-05-19 DIAGNOSIS — Z7960 Long term (current) use of unspecified immunomodulators and immunosuppressants: Secondary | ICD-10-CM

## 2019-05-19 DIAGNOSIS — Z79899 Other long term (current) drug therapy: Secondary | ICD-10-CM | POA: Diagnosis not present

## 2019-05-19 DIAGNOSIS — R252 Cramp and spasm: Secondary | ICD-10-CM | POA: Diagnosis not present

## 2019-05-19 DIAGNOSIS — E118 Type 2 diabetes mellitus with unspecified complications: Secondary | ICD-10-CM | POA: Diagnosis not present

## 2019-05-19 DIAGNOSIS — K50819 Crohn's disease of both small and large intestine with unspecified complications: Secondary | ICD-10-CM

## 2019-05-19 DIAGNOSIS — Z125 Encounter for screening for malignant neoplasm of prostate: Secondary | ICD-10-CM

## 2019-05-19 DIAGNOSIS — M79642 Pain in left hand: Secondary | ICD-10-CM

## 2019-05-19 DIAGNOSIS — E782 Mixed hyperlipidemia: Secondary | ICD-10-CM

## 2019-05-19 DIAGNOSIS — Z0000 Encounter for general adult medical examination without abnormal findings: Secondary | ICD-10-CM | POA: Diagnosis not present

## 2019-05-19 DIAGNOSIS — Z794 Long term (current) use of insulin: Secondary | ICD-10-CM | POA: Diagnosis not present

## 2019-05-19 DIAGNOSIS — I10 Essential (primary) hypertension: Secondary | ICD-10-CM

## 2019-05-19 LAB — CBC
HCT: 45.4 % (ref 39.0–52.0)
Hemoglobin: 15.4 g/dL (ref 13.0–17.0)
MCHC: 33.9 g/dL (ref 30.0–36.0)
MCV: 91.9 fl (ref 78.0–100.0)
Platelets: 402 10*3/uL — ABNORMAL HIGH (ref 150.0–400.0)
RBC: 4.95 Mil/uL (ref 4.22–5.81)
RDW: 14.8 % (ref 11.5–15.5)
WBC: 7.2 10*3/uL (ref 4.0–10.5)

## 2019-05-19 LAB — FERRITIN: Ferritin: 36 ng/mL (ref 22.0–322.0)

## 2019-05-19 LAB — COMPREHENSIVE METABOLIC PANEL
ALT: 12 U/L (ref 0–53)
AST: 16 U/L (ref 0–37)
Albumin: 4.2 g/dL (ref 3.5–5.2)
Alkaline Phosphatase: 45 U/L (ref 39–117)
BUN: 20 mg/dL (ref 6–23)
CO2: 33 mEq/L — ABNORMAL HIGH (ref 19–32)
Calcium: 9.2 mg/dL (ref 8.4–10.5)
Chloride: 100 mEq/L (ref 96–112)
Creatinine, Ser: 1.31 mg/dL (ref 0.40–1.50)
GFR: 55.77 mL/min — ABNORMAL LOW (ref >60.00)
Glucose, Bld: 103 mg/dL — ABNORMAL HIGH (ref 70–99)
Potassium: 4 mEq/L (ref 3.5–5.1)
Sodium: 141 mEq/L (ref 135–145)
Total Bilirubin: 0.6 mg/dL (ref 0.2–1.2)
Total Protein: 7 g/dL (ref 6.0–8.3)

## 2019-05-19 LAB — MICROALBUMIN / CREATININE URINE RATIO
Creatinine,U: 144.7 mg/dL
Microalb Creat Ratio: 2.5 mg/g (ref 0.0–30.0)
Microalb, Ur: 3.7 mg/dL — ABNORMAL HIGH (ref 0.0–1.9)

## 2019-05-19 LAB — LIPID PANEL
Cholesterol: 200 mg/dL (ref 0–200)
HDL: 42.4 mg/dL (ref >39.00)
LDL Cholesterol: 118 mg/dL — ABNORMAL HIGH (ref 0–99)
NonHDL: 157.3
Total CHOL/HDL Ratio: 5
Triglycerides: 197 mg/dL — ABNORMAL HIGH (ref 0.0–149.0)
VLDL: 39.4 mg/dL (ref 0.0–40.0)

## 2019-05-19 LAB — PSA: PSA: 1.3 ng/mL (ref 0.10–4.00)

## 2019-05-19 LAB — HEMOGLOBIN A1C: Hgb A1c MFr Bld: 7.9 % — ABNORMAL HIGH (ref 4.6–6.5)

## 2019-05-19 MED ORDER — DICLOFENAC SODIUM 1 % TD GEL: 2.0000 g | Freq: Four times a day (QID) | TRANSDERMAL | 2 refills | Status: DC

## 2019-05-19 MED FILL — DICLOFENAC SODIUM 1 % GEL: 1 | 4 days supply | Qty: 100 | Fill #0

## 2019-05-19 MED FILL — STELARA 90 MG/ML SOSY: 90 | 56 days supply | Qty: 1 | Fill #1

## 2019-05-20 ENCOUNTER — Encounter: Payer: Self-pay | Admitting: Family Medicine

## 2019-05-20 DIAGNOSIS — Z794 Long term (current) use of insulin: Secondary | ICD-10-CM

## 2019-05-20 DIAGNOSIS — E118 Type 2 diabetes mellitus with unspecified complications: Secondary | ICD-10-CM

## 2019-05-23 ENCOUNTER — Other Ambulatory Visit: Payer: Self-pay | Admitting: Family Medicine

## 2019-05-23 DIAGNOSIS — E118 Type 2 diabetes mellitus with unspecified complications: Secondary | ICD-10-CM

## 2019-05-23 DIAGNOSIS — Z794 Long term (current) use of insulin: Secondary | ICD-10-CM

## 2019-05-23 MED FILL — TECHLITE PEN NDL 32GX1/4: 32G X 6 MM | 90 days supply | Qty: 100 | Fill #1

## 2019-05-23 MED FILL — TOUJEO SOLOSTAR 300 UNITS/M: 300 | 90 days supply | Qty: 9 | Fill #0

## 2019-05-23 MED FILL — BUDESONIDE 3 MG CPEP: 3 | 30 days supply | Qty: 90 | Fill #1

## 2019-05-23 MED FILL — TECHLITE PEN NDL 32GX1/4": 32G X 6 MM | 90 days supply | Qty: 100 | Fill #1

## 2019-05-27 MED ORDER — FARXIGA 10 MG PO TABS: 10.0000 mg | ORAL_TABLET | Freq: Every day | ORAL | 3 refills | Status: DC

## 2019-05-27 MED FILL — FARXIGA 10 MG TABLET: 10 | 90 days supply | Qty: 90 | Fill #0

## 2019-06-19 DIAGNOSIS — L821 Other seborrheic keratosis: Secondary | ICD-10-CM | POA: Diagnosis not present

## 2019-06-19 DIAGNOSIS — L814 Other melanin hyperpigmentation: Secondary | ICD-10-CM | POA: Diagnosis not present

## 2019-06-19 DIAGNOSIS — D1801 Hemangioma of skin and subcutaneous tissue: Secondary | ICD-10-CM | POA: Diagnosis not present

## 2019-06-19 DIAGNOSIS — D225 Melanocytic nevi of trunk: Secondary | ICD-10-CM | POA: Diagnosis not present

## 2019-06-19 DIAGNOSIS — L84 Corns and callosities: Secondary | ICD-10-CM | POA: Diagnosis not present

## 2019-06-19 DIAGNOSIS — L570 Actinic keratosis: Secondary | ICD-10-CM | POA: Diagnosis not present

## 2019-06-19 DIAGNOSIS — Z85820 Personal history of malignant melanoma of skin: Secondary | ICD-10-CM | POA: Diagnosis not present

## 2019-06-19 DIAGNOSIS — D2271 Melanocytic nevi of right lower limb, including hip: Secondary | ICD-10-CM | POA: Diagnosis not present

## 2019-07-03 ENCOUNTER — Encounter: Payer: Self-pay | Admitting: Family Medicine

## 2019-07-07 MED FILL — BUDESONIDE 3 MG CPEP: 3 | 30 days supply | Qty: 90 | Fill #2

## 2019-07-09 ENCOUNTER — Telehealth: Payer: Self-pay | Admitting: Internal Medicine

## 2019-07-09 NOTE — Telephone Encounter (Signed)
Prior auth started for Thomas Peterson code is the prior auth reference.

## 2019-07-09 NOTE — Telephone Encounter (Signed)
Colletta Maryland from [facility] stated that pt had initial infusion of Stelara at [facility] also did a PA to have maintenance doses approved by insurance.  Colletta Maryland informed that maintenance dose approval expires today and that we will need to pick up on future authorisations if the pt is to continue on Stelara.    Needs PA to go through [employer] tele: [phone]  Pt to pick up his medication at [facility] in [place].

## 2019-07-11 NOTE — Telephone Encounter (Signed)
Thomas Peterson has been approved from 07/10/19-07/08/20  Patient notified

## 2019-07-23 MED FILL — STELARA 90 MG/ML SOSY: 90 | 56 days supply | Qty: 1 | Fill #2

## 2019-07-28 MED FILL — EZETIMIBE 10 MG TABS: 10 | 90 days supply | Qty: 90 | Fill #1

## 2019-07-28 MED FILL — VENLAFAXINE HCL ER 37.5 MG: 37.5 | 90 days supply | Qty: 90 | Fill #1

## 2019-07-28 MED FILL — PIOGLITAZONE HCL 30 MG TAB: 30 | 90 days supply | Qty: 90 | Fill #1

## 2019-07-28 MED FILL — AMLODIPINE BESYLATE 10 MG T: 10 | 90 days supply | Qty: 90 | Fill #1

## 2019-07-28 MED FILL — VALSARTAN-HCTZ 320-25 MG TA: 320-25 | 90 days supply | Qty: 90 | Fill #1

## 2019-08-06 ENCOUNTER — Encounter: Payer: Self-pay | Admitting: Family Medicine

## 2019-08-06 DIAGNOSIS — M79642 Pain in left hand: Secondary | ICD-10-CM

## 2019-08-06 DIAGNOSIS — E782 Mixed hyperlipidemia: Secondary | ICD-10-CM

## 2019-08-07 MED ORDER — VASCEPA 1 G PO CAPS: 2.0000 | ORAL_CAPSULE | Freq: Two times a day (BID) | ORAL | 11 refills | Status: DC

## 2019-08-07 MED FILL — VASCEPA 1 GM CAPSULE: 1 | 30 days supply | Qty: 120 | Fill #0

## 2019-08-19 DIAGNOSIS — M25541 Pain in joints of right hand: Secondary | ICD-10-CM | POA: Diagnosis not present

## 2019-08-20 ENCOUNTER — Other Ambulatory Visit: Payer: Self-pay | Admitting: Internal Medicine

## 2019-08-20 DIAGNOSIS — K5090 Crohn's disease, unspecified, without complications: Secondary | ICD-10-CM

## 2019-08-20 MED FILL — BUDESONIDE 3 MG CPEP: 3 | 30 days supply | Qty: 90 | Fill #0

## 2019-08-20 NOTE — Telephone Encounter (Signed)
I have refilled his medicine as approved and left patient a detailed voice mail to call us back and set up an appointment.

## 2019-08-20 NOTE — Telephone Encounter (Signed)
After looking back at his May telehealth visit notes I called him for an update. He feels the Delsa Grana is not helping. He is still on the budesonide starting 32m daily today down from 93mdaily. He is having 2-3 day episodes of being tired, diarrhea with a foul smell and abdominal pain. If he can rest it seems to help him.He was going to try the 71m22maily for awhile before he called in to update you Sir. Please advise if I may refill his budesonide , thank you.

## 2019-08-20 NOTE — Telephone Encounter (Signed)
Ok to refill x 2   Ask him to schedule a visit with me  I am ok with telehealh but prefer in person if possible

## 2019-08-27 ENCOUNTER — Encounter: Payer: Self-pay | Admitting: Family Medicine

## 2019-09-04 MED FILL — FARXIGA 10 MG TABLET: 10 | 90 days supply | Qty: 90 | Fill #1

## 2019-09-04 MED FILL — VASCEPA 1 GM CAPSULE: 1 | 30 days supply | Qty: 120 | Fill #1

## 2019-09-04 MED FILL — TOUJEO SOLOSTAR 300 UNITS/M: 300 | 90 days supply | Qty: 9 | Fill #1

## 2019-09-23 MED FILL — TECHLITE PEN NDL 32GX1/4: 32G X 6 MM | 90 days supply | Qty: 100 | Fill #2

## 2019-09-23 MED FILL — STELARA 90 MG/ML SOSY: 90 | 56 days supply | Qty: 1 | Fill #3

## 2019-09-30 DIAGNOSIS — M25541 Pain in joints of right hand: Secondary | ICD-10-CM | POA: Diagnosis not present

## 2019-10-03 IMAGING — CT CT ENTEROGRAPHY (ABD-PELV W/ CM)
2 of 6 series · 15 of 46 positions shown, 17 images · IV contrast (ISOVUE 300)
Comparison: None.

CLINICAL DATA: Crohn's disease of the small and large intestine.
Intermittent diarrhea.

EXAM:
CT ABDOMEN AND PELVIS WITH CONTRAST (ENTEROGRAPHY)
TECHNIQUE: Multidetector CT of the abdomen and pelvis during bolus
administration of intravenous contrast. Negative oral contrast was
given.
CONTRAST:  100mL 153R4E-WSS IOPAMIDOL (153R4E-WSS) INJECTION 61%

[Series 4: entero thins · axial · 0.80mm/px · z∈[-612,-96]mm · 12 of 290 slices shown, 14 images]
[im 16/290  soft-tissue]
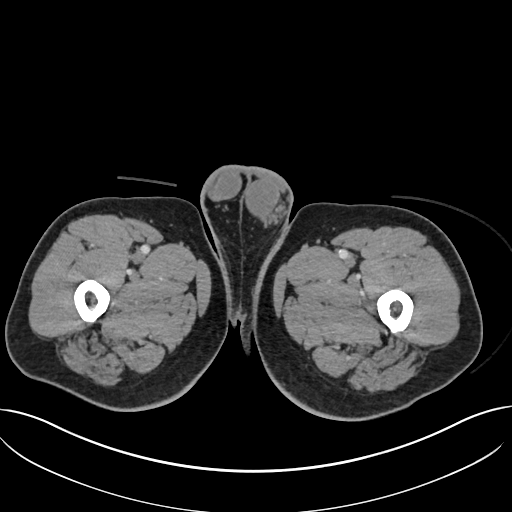
[im 16/290  bone]
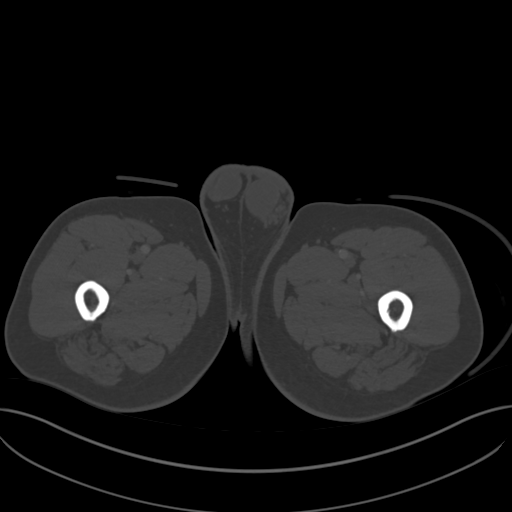
[im 46/290  soft-tissue]
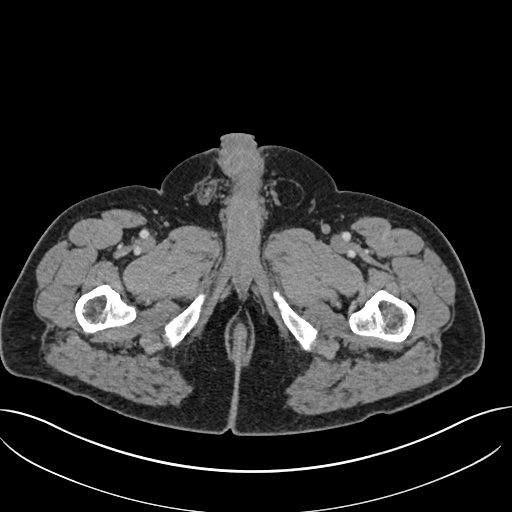
[im 61/290  soft-tissue]
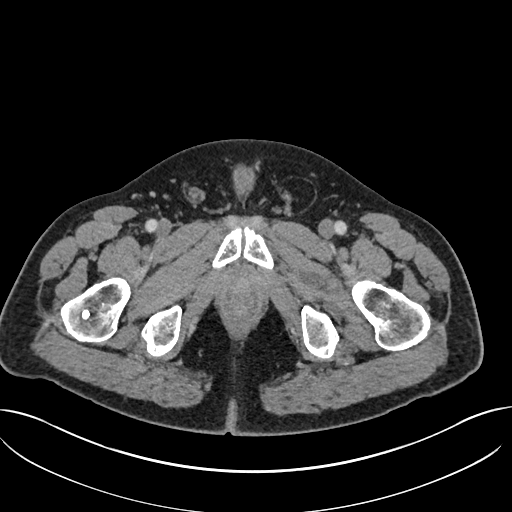
[im 92/290  soft-tissue]
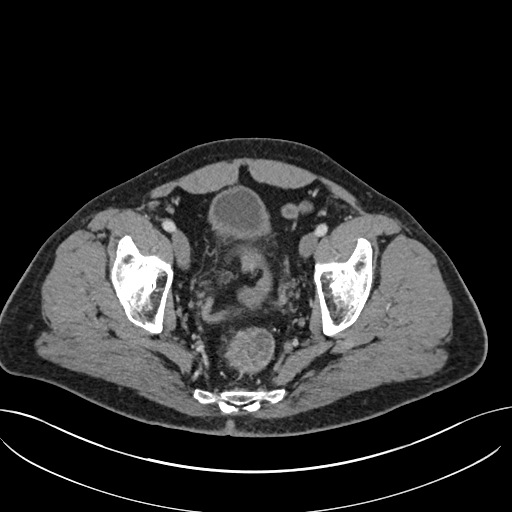
[im 107/290  soft-tissue]
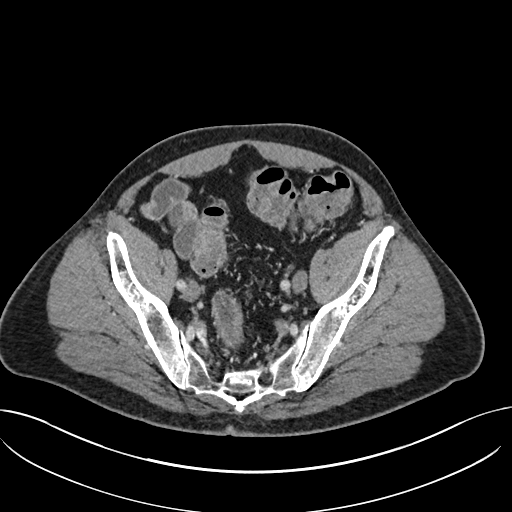
[im 137/290  soft-tissue]
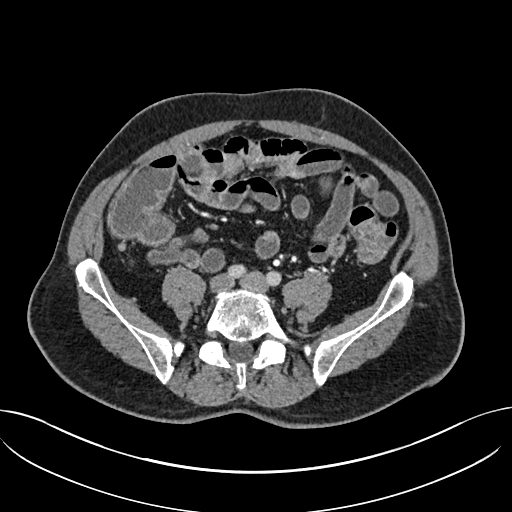
[im 153/290  soft-tissue]
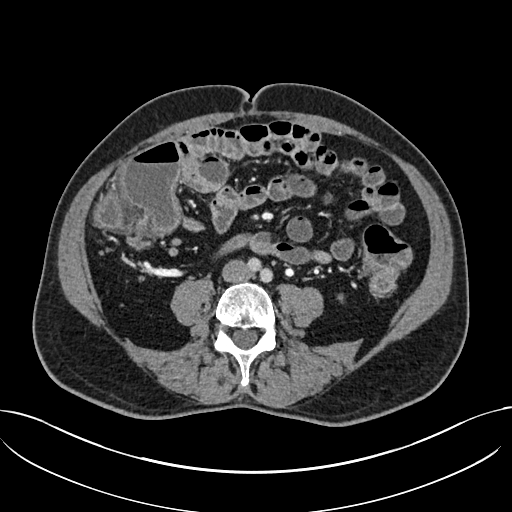
[im 183/290  soft-tissue]
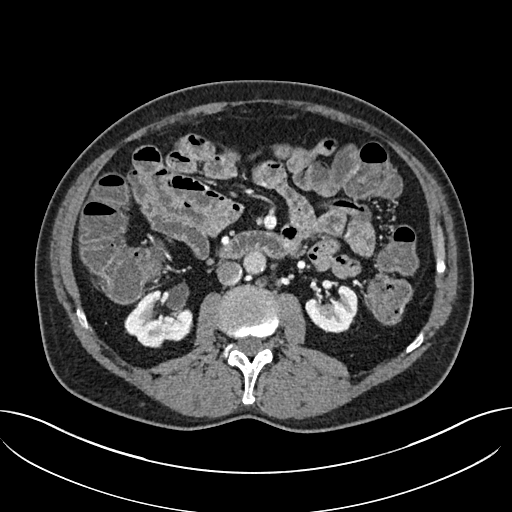
[im 198/290  soft-tissue]
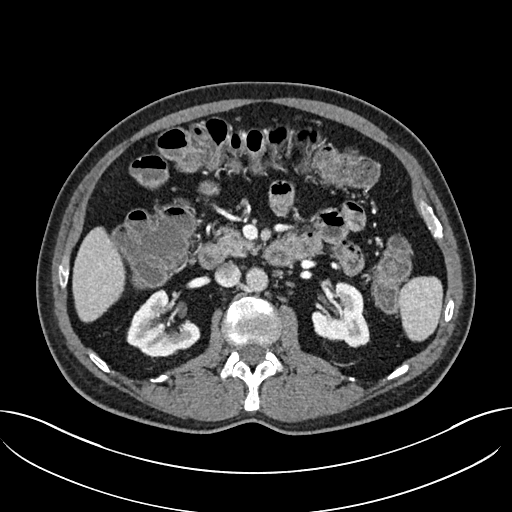
[im 198/290  bone]
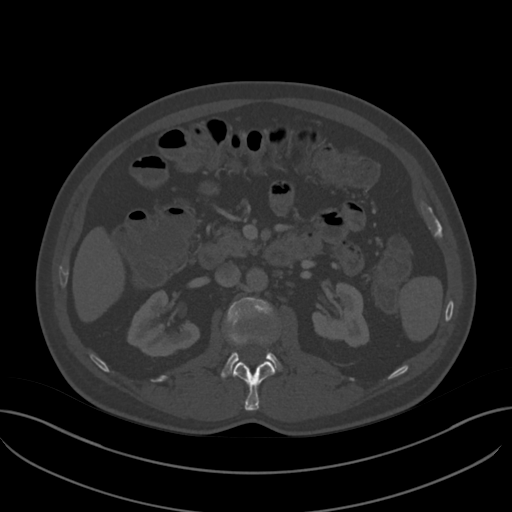
[im 229/290  soft-tissue]
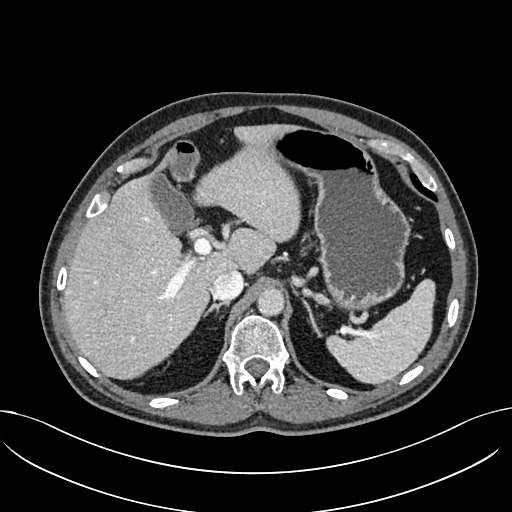
[im 244/290  soft-tissue]
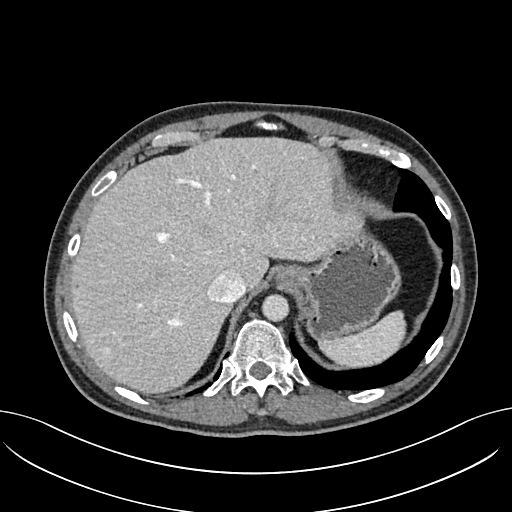
[im 274/290  soft-tissue]
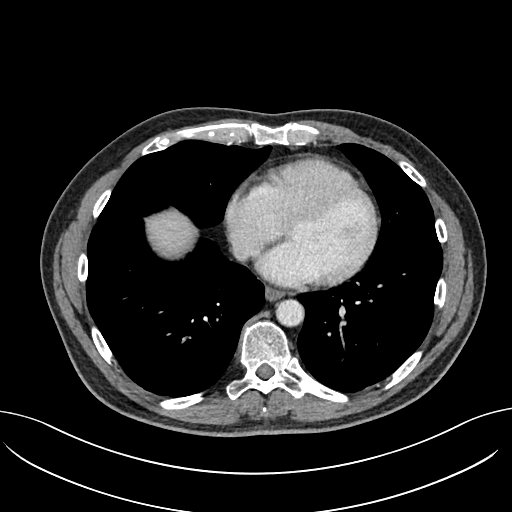

[Series 7: coronal · coronal · 0.70mm/px · 3 of 100 slices shown]
[im 34/100  soft-tissue]
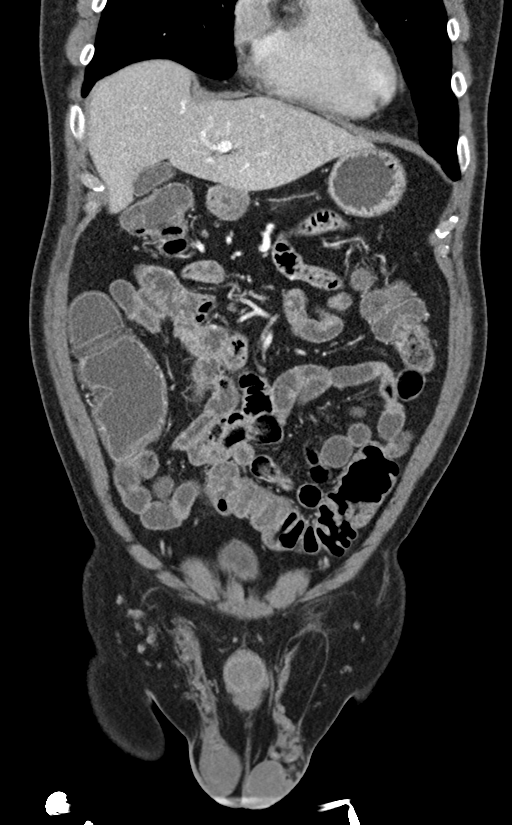
[im 45/100  soft-tissue]
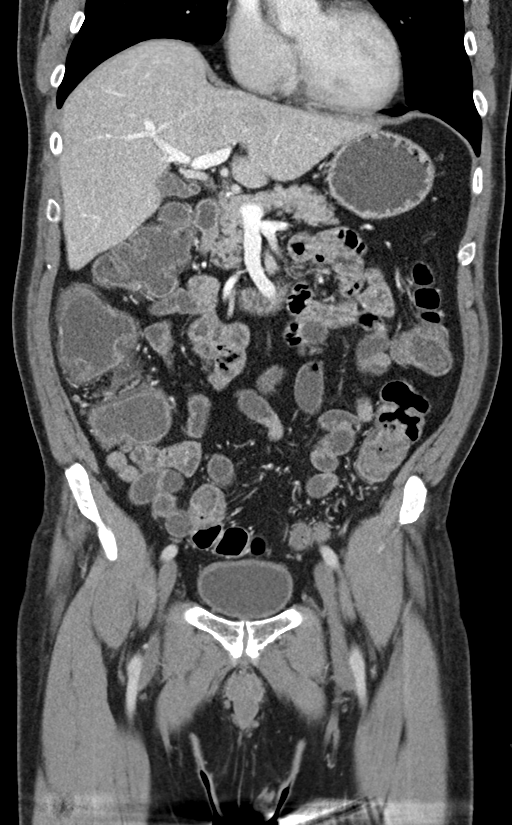
[im 56/100  soft-tissue]
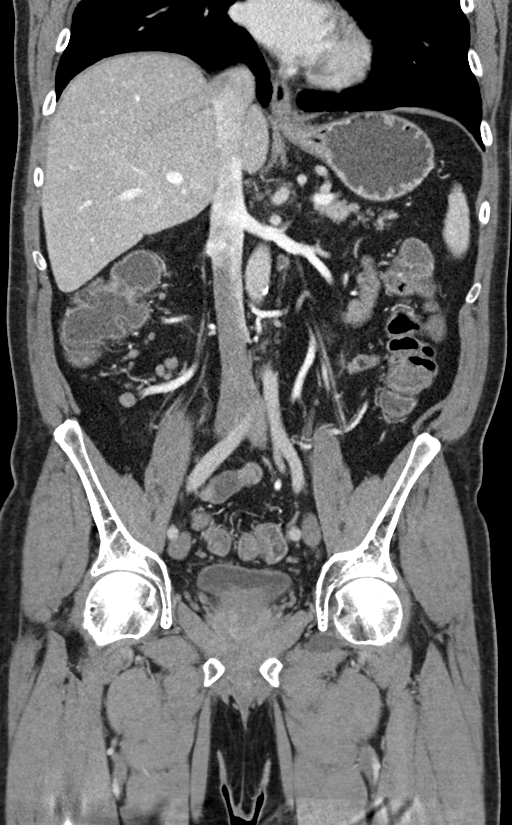

[15 of 46 positions shown; findings below may reference images not displayed]

FINDINGS: Lower chest: Lung bases are clear.

Hepatobiliary: Small ovoid hyperdense lesion in the central LEFT
hepatic lobe measuring 10 mm (image [DATE]). Favor small benign
vascular lesion. No biliary duct dilatation. Normal gallbladder

Pancreas: Pancreas is normal. No ductal dilatation. No pancreatic
inflammation.

Spleen: Normal spleen

Adrenals/urinary tract: Adrenal glands and kidneys are normal. The
ureters and bladder normal.

Stomach/Bowel: The GI tract is distended by negative oral contrast.
Stomach and duodenum normal. There is normal mucosal pattern of the
jejunum and ileum. No small bowel stricture. No small bowel
dilatation. No abnormal mucosal enhancement.

The terminal ileum is normal. No stricturing or inflammation about
the terminal ileum. Appendix normal.

The ascending transverse colon normal. Descending colon normal.
Rectosigmoid colon normal.

Several small lymph nodes in the ileocecal mesentery (image 52/2).
This is typical location for lymph nodes.

Vascular/Lymphatic: Abdominal aorta is normal caliber. No periportal
or retroperitoneal adenopathy. No pelvic adenopathy.

Reproductive: Prostate normal

Other: No free fluid.

Musculoskeletal: No aggressive osseous lesion.
IMPRESSION: 1. No evidence of active Crohn's disease in the small intestine or
large intestine.
2. No evidence of a chronic inflammatory bowel disease (e.g.
stricturing or inflammation).
3. Small enhancing lesion LEFT hepatic lobe is favored benign
vascular lesion.

## 2019-10-06 ENCOUNTER — Ambulatory Visit: Payer: 59 | Admitting: Internal Medicine

## 2019-10-06 ENCOUNTER — Encounter: Payer: Self-pay | Admitting: Internal Medicine

## 2019-10-06 ENCOUNTER — Other Ambulatory Visit (INDEPENDENT_AMBULATORY_CARE_PROVIDER_SITE_OTHER): Payer: 59

## 2019-10-06 ENCOUNTER — Other Ambulatory Visit: Payer: Self-pay

## 2019-10-06 DIAGNOSIS — Z794 Long term (current) use of insulin: Secondary | ICD-10-CM | POA: Diagnosis not present

## 2019-10-06 DIAGNOSIS — E118 Type 2 diabetes mellitus with unspecified complications: Secondary | ICD-10-CM

## 2019-10-06 DIAGNOSIS — Z7960 Long term (current) use of unspecified immunomodulators and immunosuppressants: Secondary | ICD-10-CM

## 2019-10-06 DIAGNOSIS — Z79899 Other long term (current) drug therapy: Secondary | ICD-10-CM

## 2019-10-06 DIAGNOSIS — K5080 Crohn's disease of both small and large intestine without complications: Secondary | ICD-10-CM

## 2019-10-06 LAB — CBC WITH DIFFERENTIAL/PLATELET
Basophils Absolute: 0.1 10*3/uL (ref 0.0–0.1)
Basophils Relative: 0.9 % (ref 0.0–3.0)
Eosinophils Absolute: 0.7 10*3/uL (ref 0.0–0.7)
Eosinophils Relative: 7.9 % — ABNORMAL HIGH (ref 0.0–5.0)
HCT: 45.9 % (ref 39.0–52.0)
Hemoglobin: 15.8 g/dL (ref 13.0–17.0)
Lymphocytes Relative: 31.2 % (ref 12.0–46.0)
Lymphs Abs: 2.7 10*3/uL (ref 0.7–4.0)
MCHC: 34.3 g/dL (ref 30.0–36.0)
MCV: 91 fl (ref 78.0–100.0)
Monocytes Absolute: 0.6 10*3/uL (ref 0.1–1.0)
Monocytes Relative: 7.2 % (ref 3.0–12.0)
Neutro Abs: 4.5 10*3/uL (ref 1.4–7.7)
Neutrophils Relative %: 52.8 % (ref 43.0–77.0)
Platelets: 374 10*3/uL (ref 150.0–400.0)
RBC: 5.05 Mil/uL (ref 4.22–5.81)
RDW: 14.4 % (ref 11.5–15.5)
WBC: 8.5 10*3/uL (ref 4.0–10.5)

## 2019-10-06 LAB — COMPREHENSIVE METABOLIC PANEL
ALT: 16 U/L (ref 0–53)
AST: 17 U/L (ref 0–37)
Albumin: 4.2 g/dL (ref 3.5–5.2)
Alkaline Phosphatase: 45 U/L (ref 39–117)
BUN: 24 mg/dL — ABNORMAL HIGH (ref 6–23)
CO2: 30 mEq/L (ref 19–32)
Calcium: 9.2 mg/dL (ref 8.4–10.5)
Chloride: 98 mEq/L (ref 96–112)
Creatinine, Ser: 1.27 mg/dL (ref 0.40–1.50)
GFR: 57.73 mL/min — ABNORMAL LOW (ref >60.00)
Glucose, Bld: 242 mg/dL — ABNORMAL HIGH (ref 70–99)
Potassium: 3.2 mEq/L — ABNORMAL LOW (ref 3.5–5.1)
Sodium: 138 mEq/L (ref 135–145)
Total Bilirubin: 0.6 mg/dL (ref 0.2–1.2)
Total Protein: 7.2 g/dL (ref 6.0–8.3)

## 2019-10-06 LAB — FERRITIN: Ferritin: 43.8 ng/mL (ref 22.0–322.0)

## 2019-10-06 LAB — HEMOGLOBIN A1C: Hgb A1c MFr Bld: 8.8 % — ABNORMAL HIGH (ref 4.6–6.5)

## 2019-10-06 NOTE — Assessment & Plan Note (Addendum)
Doing well on current regimen of Stelara every 8 weeks.  He had remission induction using budesonide for 8 weeks as well.  Continue current treatment.  See me in 6 months sooner if needed.\  CBC and ferritin and CMET

## 2019-10-06 NOTE — Assessment & Plan Note (Addendum)
Hgb A1c at patient's request.  The budesonide use probably drove some of the elevation of this.

## 2019-10-06 NOTE — Progress Notes (Signed)
Thomas Peterson 58 y.o. Sep 09, 1961 [id number]  Assessment & Plan:   Crohn's ileocolitis [facility]) Doing well on current regimen of Stelara every 8 weeks.  He had remission induction using budesonide for 8 weeks as well.  Continue current treatment.  See me in 6 months sooner if needed.\  CBC and ferritin and CMET  Long-term use of immunosuppressant medication Had flu shot No se's  Controlled type 2 diabetes mellitus with complication, with long-term current use of insulin (HCC) Hgb A1c at patient's request.  The budesonide use probably drove some of the elevation of this.    I appreciate the opportunity to care for this patient. CC: Thomas Peterson, Gay Filler, MD   Subjective:   Chief Complaint: Follow-up of Crohn's ileocolitis  HPI Thomas Peterson is here reporting that he is doing well.  He took about 2 months of budesonide 9 mg daily and he has been initiated on Stelara and he has about to have his second or third subcutaneous injection after the initial infusion.  He is not having any GI symptoms and he does not have any arthritis complaints.  He does have a splint on his left thumb where he has been having some pain.  He had his flu shot through [facility].  He is due for a hemoglobin A1c.  Asking if I can draw that because we were going to draw other labs, his ferritin was low normal and his platelets were slightly high earlier this year. Allergies  Allergen Reactions  . Sulfa Antibiotics Anaphylaxis    "I got these purple spots everywhere"   . Ciprofloxacin   . Flagyl [Metronidazole]     GI effects   . Metformin And Related     Worsens his crohn disease sx, diarrhea   . Statins Other (See Comments)    Leg cramps   Current Meds  Medication Sig  . amLODipine (NORVASC) 10 MG tablet Take 1 tablet (10 mg total) by mouth daily.  . Blood Glucose Monitoring Suppl (FREESTYLE LITE) DEVI Dispense 1 glucose monitoring device.  Use as directed by MD  . Steva Colder (BOSWELLIA PO) Take 1 tablet  by mouth 2 (two) times daily.  . Cholecalciferol (VITAMIN D3 PO) Take by mouth. 2500 iu daily  . dapagliflozin propanediol (FARXIGA) 10 MG TABS tablet Take 10 mg by mouth daily.  . diclofenac sodium (VOLTAREN) 1 % GEL Apply 2 g topically 4 (four) times daily. Apply to hand joints as needed. Max 16 gm per joint and 32 grams total daily  . glucose blood (FREESTYLE LITE) test strip Use to test glucose 1-2x daily  . Icosapent Ethyl (VASCEPA) 1 g CAPS Take 2 capsules (2 g total) by mouth 2 (two) times daily.  Marland Kitchen ipratropium (ATROVENT) 0.03 % nasal spray Place 1 spray into both nostrils daily.  . Multiple Vitamin (MULTIVITAMIN) tablet Take 1 tablet by mouth daily.  . Omega-3 Fatty Acids (FISH OIL PO) Take 4 g by mouth daily.  . pioglitazone (ACTOS) 30 MG tablet Take 1 tablet (30 mg total) by mouth daily.  . Probiotic Product (VSL#3 PO) Take by mouth as needed.  . TECHLITE PEN NEEDLES 32G X 6 MM MISC USE ONCE DAILY  . TOUJEO SOLOSTAR 300 UNIT/ML SOPN INJECT 30 UNITS INTO THE SKIN DAILY.  Marland Kitchen ustekinumab (STELARA) 90 MG/ML SOSY injection Inject 1 mL (90 mg total) into the skin every 8 (eight) weeks.  . valsartan-hydrochlorothiazide (DIOVAN-HCT) 320-25 MG tablet Take 1 tablet by mouth daily.  Marland Kitchen venlafaxine XR (EFFEXOR-XR) 37.5  MG 24 hr capsule TAKE 1 CAPSULE BY MOUTH DAILY WITH BREAKFAST  . [DISCONTINUED] budesonide (ENTOCORT EC) 3 MG 24 hr capsule TAKE 1 TO 3 CAPSULES BY MOUTH DAILY AS NEEDED FOR CROHN'S SYMPTOMS   Past Medical History:  Diagnosis Date  . Crohn's disease without complication ([place]) 5/44/9201  . Diabetes mellitus without complication ([place])   . Dyslipidemia   . History of basal cell carcinoma   . History of melanoma   . History of mood disorder   . Hypertension   . Renal insufficiency    Past Surgical History:  Procedure Laterality Date  . COLONOSCOPY    . KNEE ARTHROSCOPY Right 1999  . MELANOMA EXCISION  1999   and squamous and basal cell excisions  . TONSILLECTOMY  70    Social History   Social History Narrative   Married one daughter born 1996 she lives in [place] working in Education administrator, second career, [employer] radiation therapy, employed by 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   Review of Systems As per HPI  Objective:   Physical Exam BP 112/74 (BP Location: Left Arm, Patient Position: Sitting, Cuff Size: Normal)   Pulse 87   Temp 97.6 F (36.4 C) (Oral)   Ht 6' (1.829 m)   Wt 184 lb 6 oz (83.6 kg)   BMI 25.01 kg/m   Well-developed well-nourished no acute distress Eyes anicteric Lungs clear Normal heart sounds Abdomen is soft and nontender bowel sounds present

## 2019-10-06 NOTE — Patient Instructions (Signed)
Your provider has requested that you go to the basement level for lab work before leaving today. Press "B" on the elevator. The lab is located at the first door on the left as you exit the elevator.   We will call you with results and plans.    I appreciate the opportunity to care for you. Silvano Rusk, MD, [facility]

## 2019-10-06 NOTE — Assessment & Plan Note (Signed)
Had flu shot No se's

## 2019-10-07 NOTE — Progress Notes (Signed)
Thomas Peterson,  Labs show that blood sugar control worse - I know that the budesonide 9 mg daily x 2 months had something to do with that. However blood sugar 242 too high also and you are off the budesonide.  Potassium is slightly low.  Ferritin - iron stores - is ok but I believe best if > 100 so please take 325 mg ferrous sulfate on Mon, Wed, Fri I am ccing Dr. Lorelei Pont so she may make treatment and follow-up recommendations.  I appreciate the opportunity to care for you. Gatha Mayer, MD, [facility]

## 2019-10-09 ENCOUNTER — Encounter: Payer: Self-pay | Admitting: Family Medicine

## 2019-10-12 NOTE — Progress Notes (Signed)
[facility] at [facility] [address], [place], [place] [address] [phone]  Date:  10/13/2019   Name:  Thomas Peterson   DOB:  Apr 28, 1961   MRN:  [id number]  PCP:  Thomas Mclean, MD    Chief Complaint: No chief complaint on file.   History of Present Illness:  Thomas Peterson is a 58 y.o. very pleasant male patient who presents with the following:  Virtual visit today for concern of elevation of his A1c-virtual visit by telephone Patient with history of Crohn's disease, diabetes, hypertension, hyperlipidemia, skin cancer Patient location is home, provider is at office.  Patient identity confirmed with 2 factors, he gives consent for virtual visit today  Last seen by myself in June of this year  Lab Results  Component Value Date   HGBA1C 8.8 (H) 10/06/2019   His A1c a year ago was 7.4, this past June 7.9 He has been using steroids to control his Crohn's disease, which is probably why his A1c has increased Thankfully he has actually been able to taper off the steroids, and has been off of all steroids for the last 1 to 2 weeks His blood sugar has been variable, may be as high as 200 and as low as 120.  He has not been bothered by overly low blood sugars.  He is able to recognize the symptoms of hypoglycemia His weight has been stable, though he admits he is not exercising as much during the pandemic-he used to enjoy going to the gym  Amlodipine Farxiga 10 Vascepa Actos 30 Toujeo 30 units daily-he did increase to 32 units recently due to high blood sugar Stelara Valsartan HCTZ Venlafaxine  He is not able to tolerate Metformin  BP Readings from Last 3 Encounters:  10/06/19 112/74  05/19/19 120/68  01/17/19 123/80    Patient Active Problem List   Diagnosis Date Noted  . Long-term use of immunosuppressant medication 09/17/2018  . Low back pain 06/04/2018  . Controlled type 2 diabetes mellitus with complication, with  long-term current use of insulin ([place]) 01/29/2017  . Essential hypertension 01/29/2017  . Crohn's ileocolitis ([place]) 01/29/2017  . Mixed hyperlipidemia 01/29/2017  . Leg cramps 01/29/2017  . History of skin cancer 01/29/2017    Past Medical History:  Diagnosis Date  . Crohn's disease without complication ([place]) 7/90/2409  . Diabetes mellitus without complication ([place])   . Dyslipidemia   . History of basal cell carcinoma   . History of melanoma   . History of mood disorder   . Hypertension   . Renal insufficiency     Past Surgical History:  Procedure Laterality Date  . COLONOSCOPY    . KNEE ARTHROSCOPY Right 1999  . MELANOMA EXCISION  1999   and squamous and basal cell excisions  . TONSILLECTOMY  1965    Social History   Tobacco Use  . Smoking status: Former Smoker    Types: Cigarettes    Quit date: 1995    Years since quitting: 25.8  . Smokeless tobacco: Never Used  Substance Use Topics  . Alcohol use: Yes    Comment: occasional-1-2 per week  . Drug use: Never    Family History  Problem Relation Age of Onset  . Breast cancer Mother   . Heart attack Mother   . Cancer Brother        type unknown, mets  . Diabetes Brother   . Crohn's disease Paternal Uncle   .  Rheum arthritis Paternal Aunt     Allergies  Allergen Reactions  . Sulfa Antibiotics Anaphylaxis    "I got these purple spots everywhere"   . Ciprofloxacin   . Flagyl [Metronidazole]     GI effects   . Metformin And Related     Worsens his crohn disease sx, diarrhea   . Statins Other (See Comments)    Leg cramps    Medication list has been reviewed and updated.  Current Outpatient Medications on File Prior to Visit  Medication Sig Dispense Refill  . amLODipine (NORVASC) 10 MG tablet Take 1 tablet (10 mg total) by mouth daily. 90 tablet 1  . Blood Glucose Monitoring Suppl (FREESTYLE LITE) DEVI Dispense 1 glucose monitoring device.  Use as directed by MD 1 each 0  . Boswellia Serrata (BOSWELLIA  PO) Take 1 tablet by mouth 2 (two) times daily.    . Cholecalciferol (VITAMIN D3 PO) Take by mouth. 2500 iu daily    . dapagliflozin propanediol (FARXIGA) 10 MG TABS tablet Take 10 mg by mouth daily. 90 tablet 3  . diclofenac sodium (VOLTAREN) 1 % GEL Apply 2 g topically 4 (four) times daily. Apply to hand joints as needed. Max 16 gm per joint and 32 grams total daily 100 g 2  . glucose blood (FREESTYLE LITE) test strip Use to test glucose 1-2x daily 100 each 12  . Icosapent Ethyl (VASCEPA) 1 g CAPS Take 2 capsules (2 g total) by mouth 2 (two) times daily. 120 capsule 11  . ipratropium (ATROVENT) 0.03 % nasal spray Place 1 spray into both nostrils daily.    . Multiple Vitamin (MULTIVITAMIN) tablet Take 1 tablet by mouth daily.    . Omega-3 Fatty Acids (FISH OIL PO) Take 4 g by mouth daily.    . pioglitazone (ACTOS) 30 MG tablet Take 1 tablet (30 mg total) by mouth daily. 90 tablet 1  . Probiotic Product (VSL#3 PO) Take by mouth as needed.    . TECHLITE PEN NEEDLES 32G X 6 MM MISC USE ONCE DAILY 100 each PRN  . TOUJEO SOLOSTAR 300 UNIT/ML SOPN INJECT 30 UNITS INTO THE SKIN DAILY. 13.5 mL 3  . ustekinumab (STELARA) 90 MG/ML SOSY injection Inject 1 mL (90 mg total) into the skin every 8 (eight) weeks. 1 Syringe 3  . valsartan-hydrochlorothiazide (DIOVAN-HCT) 320-25 MG tablet Take 1 tablet by mouth daily. 90 tablet 1  . venlafaxine XR (EFFEXOR-XR) 37.5 MG 24 hr capsule TAKE 1 CAPSULE BY MOUTH DAILY WITH BREAKFAST 90 capsule 1   No current facility-administered medications on file prior to visit.     Review of Systems:  As per HPI- otherwise negative. No fever or chills Physical Examination: There were no vitals filed for this visit. There were no vitals filed for this visit. There is no height or weight on file to calculate BMI. Ideal Body Weight:     Spoke with patient on the phone, he sounds well.  No cough, shortness of breath noted  Assessment and Plan: Essential  hypertension  Controlled type 2 diabetes mellitus with complication, with long-term current use of insulin (HCC) - Plan: pioglitazone (ACTOS) 45 MG tablet  Mixed hyperlipidemia - Plan: Icosapent Ethyl (VASCEPA) 1 g CAPS  A1c elevated, possibly due to recent use of oral steroids for Crohn disease Discussed various options with the patient.  We decided to increase his Actos to 45 mg.  During the few days after this increase, he will decrease Toujeo back to 30 and watch  his blood sugars carefully.  If sugars continue to run high he may gradually titrate his Toujeo to 32 and 34 units if necessary  He is asked to keep me closely updated regarding his progress Refilled Vascepa as well Spoke to pt for 7:30 today on the phone  Signed Lamar Blinks, MD

## 2019-10-13 ENCOUNTER — Other Ambulatory Visit: Payer: Self-pay

## 2019-10-13 ENCOUNTER — Encounter: Payer: Self-pay | Admitting: Family Medicine

## 2019-10-13 ENCOUNTER — Telehealth (INDEPENDENT_AMBULATORY_CARE_PROVIDER_SITE_OTHER): Payer: 59 | Admitting: Family Medicine

## 2019-10-13 DIAGNOSIS — E118 Type 2 diabetes mellitus with unspecified complications: Secondary | ICD-10-CM

## 2019-10-13 DIAGNOSIS — E782 Mixed hyperlipidemia: Secondary | ICD-10-CM

## 2019-10-13 DIAGNOSIS — I10 Essential (primary) hypertension: Secondary | ICD-10-CM

## 2019-10-13 DIAGNOSIS — Z794 Long term (current) use of insulin: Secondary | ICD-10-CM | POA: Diagnosis not present

## 2019-10-13 MED ORDER — VASCEPA 1 G PO CAPS: 2.0000 | ORAL_CAPSULE | Freq: Two times a day (BID) | ORAL | 3 refills | Status: DC

## 2019-10-13 MED ORDER — PIOGLITAZONE HCL 45 MG PO TABS: 45.0000 mg | ORAL_TABLET | Freq: Every day | ORAL | 3 refills | Status: DC

## 2019-10-13 MED FILL — PIOGLITAZONE HCL 45 MG TAB: 45 | 90 days supply | Qty: 90 | Fill #0

## 2019-10-13 MED FILL — VASCEPA 1 GM CAPSULE: 1 | 90 days supply | Qty: 360 | Fill #0

## 2019-10-29 ENCOUNTER — Encounter: Payer: Self-pay | Admitting: Family Medicine

## 2019-10-30 ENCOUNTER — Encounter: Payer: Self-pay | Admitting: Family Medicine

## 2019-10-30 DIAGNOSIS — H5213 Myopia, bilateral: Secondary | ICD-10-CM | POA: Diagnosis not present

## 2019-10-30 DIAGNOSIS — E113292 Type 2 diabetes mellitus with mild nonproliferative diabetic retinopathy without macular edema, left eye: Secondary | ICD-10-CM | POA: Diagnosis not present

## 2019-10-30 DIAGNOSIS — M25541 Pain in joints of right hand: Secondary | ICD-10-CM | POA: Diagnosis not present

## 2019-10-30 DIAGNOSIS — M25542 Pain in joints of left hand: Secondary | ICD-10-CM | POA: Diagnosis not present

## 2019-11-20 ENCOUNTER — Other Ambulatory Visit: Payer: Self-pay

## 2019-11-20 ENCOUNTER — Other Ambulatory Visit: Payer: Self-pay | Admitting: Pharmacist

## 2019-11-20 DIAGNOSIS — K5080 Crohn's disease of both small and large intestine without complications: Secondary | ICD-10-CM

## 2019-11-20 MED ORDER — STELARA 90 MG/ML ~~LOC~~ SOSY: 90.0000 mg | PREFILLED_SYRINGE | SUBCUTANEOUS | 0 refills | Status: DC

## 2019-11-20 MED FILL — STELARA 90 MG/ML SOSY: 90 | 56 days supply | Qty: 1 | Fill #0 | Status: TO

## 2019-11-21 MED FILL — STELARA 90 MG/ML SOSY: 90 | 56 days supply | Qty: 1 | Fill #0

## 2019-12-15 ENCOUNTER — Other Ambulatory Visit: Payer: Self-pay | Admitting: Family Medicine

## 2019-12-15 DIAGNOSIS — I10 Essential (primary) hypertension: Secondary | ICD-10-CM

## 2019-12-15 DIAGNOSIS — Z20828 Contact with and (suspected) exposure to other viral communicable diseases: Secondary | ICD-10-CM | POA: Diagnosis not present

## 2019-12-15 MED FILL — TOUJEO SOLOSTAR 300 UNITS/M: 300 | 90 days supply | Qty: 9 | Fill #2

## 2019-12-15 MED FILL — FARXIGA 10 MG TABLET: 10 | 90 days supply | Qty: 90 | Fill #2

## 2019-12-16 MED FILL — AMLODIPINE BESYLATE 10 MG T: 10 | 90 days supply | Qty: 90 | Fill #0

## 2019-12-16 MED FILL — VENLAFAXINE HCL ER 37.5 MG: 37.5 | 90 days supply | Qty: 90 | Fill #0

## 2019-12-16 MED FILL — VALSARTAN-HCTZ 320-25 MG TA: 320-25 | 90 days supply | Qty: 90 | Fill #0

## 2020-01-09 ENCOUNTER — Other Ambulatory Visit: Payer: 59

## 2020-01-09 DIAGNOSIS — K50819 Crohn's disease of both small and large intestine with unspecified complications: Secondary | ICD-10-CM

## 2020-01-10 LAB — QUANTIFERON-TB GOLD PLUS
Mitogen-NIL: >10 IU/mL
NIL: 0.04 IU/mL
QuantiFERON-TB Gold Plus: NEGATIVE
TB1-NIL: <0 IU/mL
TB2-NIL: <0 IU/mL

## 2020-01-14 ENCOUNTER — Ambulatory Visit (HOSPITAL_BASED_OUTPATIENT_CLINIC_OR_DEPARTMENT_OTHER): Payer: 59 | Admitting: Pharmacist

## 2020-01-14 ENCOUNTER — Other Ambulatory Visit: Payer: Self-pay

## 2020-01-14 DIAGNOSIS — Z79899 Other long term (current) drug therapy: Secondary | ICD-10-CM

## 2020-01-14 MED ORDER — STELARA 90 MG/ML ~~LOC~~ SOSY: 90.0000 mg | PREFILLED_SYRINGE | SUBCUTANEOUS | 0 refills | Status: DC

## 2020-01-14 MED FILL — TECHLITE PEN NDL 32GX1/4: 32G X 6 MM | 90 days supply | Qty: 100 | Fill #3

## 2020-01-14 MED FILL — TECHLITE PEN NDL 32GX1/4": 32G X 6 MM | 90 days supply | Qty: 100 | Fill #3

## 2020-01-14 NOTE — Progress Notes (Signed)
   S: Patient presents for review of their specialty medication therapy.  Patient is currently taking Stelara for Crohn's disease. Patient is managed by Dr. Carlean Purl for this.   Adherence: confirms   Efficacy: reports that it does okay controlling his symptoms   Dosing:  Crohn disease:  Maintenance: SubQ: 90 mg every 8 weeks.  Screening: TB test: completed per patient Hepatitis: completed per patient  Monitoring: S/sx of infection: denies CBC: see below Reversible posterior leukoencephalopathy syndrome (RPLS - sx include headache, seizures, confusion, and visual disturbances): denies Squamous cell skin carcinoma: denies  Dosage form specific issues: . Latex: Packaging may contain natural latex rubber. . Polysorbate 80: Some dosage forms may contain polysorbate 80 (also known as Tweens). Hypersensitivity reactions, usually a delayed reaction, have been reported following exposure to pharmaceutical products containing polysorbate 80 in certain individuals Dolly Rias, 2002; Lucente 2000; Lollie Marrow, [place]). Thrombocytopenia, ascites, pulmonary deterioration, and renal and hepatic failure have been reported in premature neonates after receiving parenteral products containing polysorbate 80 ([place], 1986; CDC, 1984). See manufacturer's labeling.  O:  Lab Results  Component Value Date   WBC 8.5 10/06/2019   HGB 15.8 10/06/2019   HCT 45.9 10/06/2019   MCV 91.0 10/06/2019   PLT 374.0 10/06/2019      Chemistry      Component Value Date/Time   NA 138 10/06/2019 0909   K 3.2 (L) 10/06/2019 0909   CL 98 10/06/2019 0909   CO2 30 10/06/2019 0909   BUN 24 (H) 10/06/2019 0909   CREATININE 1.27 10/06/2019 0909      Component Value Date/Time   CALCIUM 9.2 10/06/2019 0909   ALKPHOS 45 10/06/2019 0909   AST 17 10/06/2019 0909   ALT 16 10/06/2019 0909   BILITOT 0.6 10/06/2019 0909      A/P: 1. Medication review: patient currently prescribed Stelara for Crohn's disease but he has not  started it yet. Has recently failed Humira. Reviewed the medication with the patient, including the following: Stelara, ustekinumab, is a TNF? blocker. Patient educated on purpose, proper use and potential adverse effects of Stelara.  Following instruction patient verbalized understanding of treatment plan. There is an increased risk of infection and malignancy with this medication. Do not give patients live vaccinations while they are on this medication. No recommendations for any changes.  Benard Halsted, PharmD, [place] [phone]

## 2020-01-16 ENCOUNTER — Telehealth: Payer: Self-pay

## 2020-01-16 MED FILL — STELARA 90 MG/ML SOSY: 90 | 56 days supply | Qty: 1 | Fill #0

## 2020-01-16 NOTE — Telephone Encounter (Addendum)
Attempted to do a PA for Stelara with [employer] Lobbyist) using Cover My Meds. Response was "no PA required." Forwarded this information to the pharmacy. Will wait for the pharmacy to run a claim.

## 2020-01-20 ENCOUNTER — Encounter: Payer: Self-pay | Admitting: Family Medicine

## 2020-01-20 DIAGNOSIS — Z794 Long term (current) use of insulin: Secondary | ICD-10-CM

## 2020-01-20 DIAGNOSIS — E118 Type 2 diabetes mellitus with unspecified complications: Secondary | ICD-10-CM

## 2020-01-21 ENCOUNTER — Other Ambulatory Visit: Payer: Self-pay | Admitting: Family Medicine

## 2020-01-21 MED ORDER — BASAGLAR KWIKPEN 100 UNIT/ML ~~LOC~~ SOPN: 30.0000 [IU] | PEN_INJECTOR | Freq: Every day | SUBCUTANEOUS | 3 refills | Status: DC

## 2020-01-21 MED FILL — BASAGLAR 100 UNIT/ML KWIKPE: 100 | 90 days supply | Qty: 27 | Fill #0

## 2020-01-21 NOTE — Addendum Note (Signed)
Addended by: Lamar Blinks C on: 01/21/2020 05:32 AM   Modules accepted: Orders

## 2020-01-27 MED FILL — BUDESONIDE 3 MG CAP: 3 | 30 days supply | Qty: 90 | Fill #1

## 2020-01-27 MED FILL — PIOGLITAZONE HCL 45 MG TAB: 45 | 90 days supply | Qty: 90 | Fill #1

## 2020-01-27 MED FILL — VASCEPA 1 GM CAPSULE: 1 | 90 days supply | Qty: 360 | Fill #1

## 2020-01-28 ENCOUNTER — Other Ambulatory Visit: Payer: Self-pay | Admitting: Internal Medicine

## 2020-01-28 ENCOUNTER — Other Ambulatory Visit: Payer: Self-pay | Admitting: Pharmacist

## 2020-01-28 MED ORDER — STELARA 90 MG/ML ~~LOC~~ SOSY: 90.0000 mg | PREFILLED_SYRINGE | SUBCUTANEOUS | 0 refills | Status: DC

## 2020-03-18 MED FILL — STELARA 90 MG/ML SOSY: 90 | 56 days supply | Qty: 1 | Fill #0

## 2020-03-22 MED FILL — VENLAFAXINE HCL ER 37.5 MG: 37.5 | 90 days supply | Qty: 90 | Fill #1

## 2020-03-22 MED FILL — AMLODIPINE BESYLATE 10 MG T: 10 | 90 days supply | Qty: 90 | Fill #1

## 2020-03-22 MED FILL — VALSARTAN-HCTZ 320-25 MG TA: 320-25 | 90 days supply | Qty: 90 | Fill #1

## 2020-03-22 MED FILL — FARXIGA 10 MG TABLET: 10 | 90 days supply | Qty: 90 | Fill #3

## 2020-03-24 ENCOUNTER — Telehealth: Payer: Self-pay | Admitting: Family Medicine

## 2020-03-24 ENCOUNTER — Encounter: Payer: Self-pay | Admitting: Family Medicine

## 2020-03-24 DIAGNOSIS — Z794 Long term (current) use of insulin: Secondary | ICD-10-CM

## 2020-03-24 DIAGNOSIS — I10 Essential (primary) hypertension: Secondary | ICD-10-CM

## 2020-03-24 DIAGNOSIS — Z125 Encounter for screening for malignant neoplasm of prostate: Secondary | ICD-10-CM

## 2020-03-24 DIAGNOSIS — E782 Mixed hyperlipidemia: Secondary | ICD-10-CM

## 2020-03-24 DIAGNOSIS — E118 Type 2 diabetes mellitus with unspecified complications: Secondary | ICD-10-CM

## 2020-03-24 NOTE — Telephone Encounter (Signed)
Patient would like for CPE labs to be done before  His appt.  Can you place an order for him. He's not coming in until  June  Thanks

## 2020-03-30 ENCOUNTER — Encounter: Payer: Self-pay | Admitting: Family Medicine

## 2020-03-31 MED ORDER — METHOCARBAMOL 500 MG PO TABS: 500.0000 mg | ORAL_TABLET | Freq: Four times a day (QID) | ORAL | 0 refills | Status: DC | PRN

## 2020-03-31 MED FILL — METHOCARBAMOL 500 MG TABLET: 500 | 7 days supply | Qty: 30 | Fill #0

## 2020-04-29 ENCOUNTER — Other Ambulatory Visit: Payer: Self-pay | Admitting: Family Medicine

## 2020-04-29 MED FILL — TECHLITE PEN NDL 32GX1/4: 32G X 6 MM | 90 days supply | Qty: 100 | Fill #0

## 2020-05-07 ENCOUNTER — Other Ambulatory Visit: Payer: Self-pay

## 2020-05-07 ENCOUNTER — Other Ambulatory Visit: Payer: Self-pay | Admitting: Internal Medicine

## 2020-05-07 MED ORDER — BUDESONIDE 3 MG PO CPEP: 9.0000 mg | ORAL_CAPSULE | Freq: Every day | ORAL | 1 refills | Status: DC

## 2020-05-07 MED FILL — BUDESONIDE 3 MG CAP: 3 | 30 days supply | Qty: 90 | Fill #0

## 2020-05-07 NOTE — Telephone Encounter (Signed)
We got a fax from the [facility] requesting a refill on patients budesonide 27m capsules. I didn't see it on his current list so I called him and he said yes he is taking this so I sent in the refill as requested.

## 2020-05-14 ENCOUNTER — Other Ambulatory Visit (INDEPENDENT_AMBULATORY_CARE_PROVIDER_SITE_OTHER): Payer: 59

## 2020-05-14 ENCOUNTER — Other Ambulatory Visit: Payer: Self-pay

## 2020-05-14 DIAGNOSIS — I10 Essential (primary) hypertension: Secondary | ICD-10-CM | POA: Diagnosis not present

## 2020-05-14 DIAGNOSIS — E782 Mixed hyperlipidemia: Secondary | ICD-10-CM | POA: Diagnosis not present

## 2020-05-14 DIAGNOSIS — Z794 Long term (current) use of insulin: Secondary | ICD-10-CM

## 2020-05-14 DIAGNOSIS — Z125 Encounter for screening for malignant neoplasm of prostate: Secondary | ICD-10-CM | POA: Diagnosis not present

## 2020-05-14 DIAGNOSIS — E118 Type 2 diabetes mellitus with unspecified complications: Secondary | ICD-10-CM

## 2020-05-14 LAB — LIPID PANEL
Cholesterol: 283 mg/dL — ABNORMAL HIGH (ref 0–200)
HDL: 44.4 mg/dL (ref >39.00)
LDL Cholesterol: 205 mg/dL — ABNORMAL HIGH (ref 0–99)
NonHDL: 238.15
Total CHOL/HDL Ratio: 6
Triglycerides: 168 mg/dL — ABNORMAL HIGH (ref 0.0–149.0)
VLDL: 33.6 mg/dL (ref 0.0–40.0)

## 2020-05-14 LAB — CBC
HCT: 48 % (ref 39.0–52.0)
Hemoglobin: 16.6 g/dL (ref 13.0–17.0)
MCHC: 34.6 g/dL (ref 30.0–36.0)
MCV: 90.6 fl (ref 78.0–100.0)
Platelets: 349 10*3/uL (ref 150.0–400.0)
RBC: 5.3 Mil/uL (ref 4.22–5.81)
RDW: 14.2 % (ref 11.5–15.5)
WBC: 6.6 10*3/uL (ref 4.0–10.5)

## 2020-05-14 LAB — COMPREHENSIVE METABOLIC PANEL
ALT: 15 U/L (ref 0–53)
AST: 17 U/L (ref 0–37)
Albumin: 4.4 g/dL (ref 3.5–5.2)
Alkaline Phosphatase: 43 U/L (ref 39–117)
BUN: 28 mg/dL — ABNORMAL HIGH (ref 6–23)
CO2: 30 mEq/L (ref 19–32)
Calcium: 9.3 mg/dL (ref 8.4–10.5)
Chloride: 99 mEq/L (ref 96–112)
Creatinine, Ser: 1.34 mg/dL (ref 0.40–1.50)
GFR: 54.15 mL/min — ABNORMAL LOW (ref >60.00)
Glucose, Bld: 170 mg/dL — ABNORMAL HIGH (ref 70–99)
Potassium: 3.5 mEq/L (ref 3.5–5.1)
Sodium: 139 mEq/L (ref 135–145)
Total Bilirubin: 0.6 mg/dL (ref 0.2–1.2)
Total Protein: 6.9 g/dL (ref 6.0–8.3)

## 2020-05-14 LAB — HEMOGLOBIN A1C: Hgb A1c MFr Bld: 7.6 % — ABNORMAL HIGH (ref 4.6–6.5)

## 2020-05-14 LAB — PSA: PSA: 1.07 ng/mL (ref 0.10–4.00)

## 2020-05-16 ENCOUNTER — Encounter: Payer: Self-pay | Admitting: Family Medicine

## 2020-05-16 NOTE — Progress Notes (Signed)
[place] at [facility] [place], [place], [place] [address] [phone]  Date:  05/19/2020   Name:  Thomas Peterson   DOB:  July 10, 1961   MRN:  [id number]  PCP:  Darreld Mclean, MD    Chief Complaint: Annual Exam   History of Present Illness:  Thomas Peterson is a 59 y.o. very pleasant male patient who presents with the following:  Here today for a follow-up visit-labs drawn last week He is a Scientist, research (medical) with [employer] We last visited virtually in November History of hypertension, controlled diabetes, Crohn's disease, hyperlipidemia, melanoma His Crohn's disease does sometimes require steroids, which can make diabetes control more challenging He does not tolerate higher doses of Metformin, leg cramps with statin  Recent labs on chart from 6/4 Lab Results  Component Value Date   HGBA1C 7.6 (H) 05/14/2020  A1c showed improvement  Meghan would like to try conservative dose of Metformin to see if he can tolerate it, in hopes of bringing his A1c down a bit more 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 he is wearing a wrist brace that helps more than anything really    He has noted some more issues with his back also recently, sometimes has loose stools.  However, he is not having frank diarrhea No recent abx use -he did have C. difficile in the past, does not think he has this currently  He notes issues with PND and throat clearing esp after meals He has tried Atrovent nasal at some point in the past, would be interested in trying this again  We discussed his cholesterol.  I would like to see improvement of his total to HDL ratio.  We discussed trying  PCSK9 inhibitor; he would like to look into this a bit more, will let me know if he is interested   Lab on 05/14/2020  Component Date Value Ref Range Status  . PSA 05/14/2020 1.07  0.10 - 4.00 ng/mL Final   Test performed using Access Hybritech PSA Assay, a parmagnetic partical, chemiluminecent immunoassay.  . Cholesterol 05/14/2020 283* 0 - 200 mg/dL Final   ATP III Classification       Desirable:  < 200 mg/dL               Borderline High:  200 - 239 mg/dL          High:  > = 240 mg/dL  . Triglycerides 05/14/2020 168.0* 0.0 - 149.0 mg/dL Final   Normal:  <150 mg/dLBorderline High:  150 - 199 mg/dL  . HDL 05/14/2020 44.40  >39.00 mg/dL Final  . VLDL 05/14/2020 33.6  0.0 - 40.0 mg/dL Final  . LDL Cholesterol 05/14/2020 205* 0 - 99 mg/dL Final  . Total CHOL/HDL Ratio 05/14/2020 6   Final                  Men          Women1/2 Average Risk     3.4          3.3Average Risk          5.0          4.42X Average Risk          9.6          7.13X Average Risk  15.0          11.0                      . NonHDL 05/14/2020 238.15   Final   NOTE:  Non-HDL goal should be 30 mg/dL higher than patient's LDL goal (i.e. LDL goal of < 70 mg/dL, would have non-HDL goal of < 100 mg/dL)  . Hgb A1c MFr Bld 05/14/2020 7.6* 4.6 - 6.5 % Final   Glycemic Control Guidelines for People with Diabetes:Non Diabetic:  <6%Goal of Therapy: <7%Additional Action Suggested:  >8%   . Sodium 05/14/2020 139  135 - 145 mEq/L Final  . Potassium 05/14/2020 3.5  3.5 - 5.1 mEq/L Final  . Chloride 05/14/2020 99  96 - 112 mEq/L Final  . CO2 05/14/2020 30  19 - 32 mEq/L Final  . Glucose, Bld 05/14/2020 170* 70 - 99 mg/dL Final  . BUN 05/14/2020 28* 6 - 23 mg/dL Final  . Creatinine, Ser 05/14/2020 1.34  0.40 - 1.50 mg/dL Final  . Total Bilirubin 05/14/2020 0.6  0.2 - 1.2 mg/dL Final  . Alkaline Phosphatase 05/14/2020 43  39 - 117 U/L Final  . AST 05/14/2020 17  0 - 37 U/L Final  . ALT 05/14/2020 15  0 - 53 U/L Final  . Total Protein 05/14/2020 6.9  6.0 - 8.3 g/dL Final  . Albumin 05/14/2020 4.4  3.5 - 5.2 g/dL Final  . GFR 05/14/2020 54.15* >60.00 mL/min Final  . Calcium 05/14/2020 9.3  8.4 - 10.5 mg/dL Final  . WBC 05/14/2020 6.6   4.0 - 10.5 K/uL Final  . RBC 05/14/2020 5.30  4.22 - 5.81 Mil/uL Final  . Platelets 05/14/2020 349.0  150.0 - 400.0 K/uL Final  . Hemoglobin 05/14/2020 16.6  13.0 - 17.0 g/dL Final  . HCT 05/14/2020 48.0  39.0 - 52.0 % Final  . MCV 05/14/2020 90.6  78.0 - 100.0 fl Final  . MCHC 05/14/2020 34.6  30.0 - 36.0 g/dL Final  . RDW 05/14/2020 14.2  11.5 - 15.5 % Final     COVID-19 series Can update foot exam  Wt Readings from Last 3 Encounters:  05/19/20 187 lb (84.8 kg)  10/06/19 184 lb 6 oz (83.6 kg)  05/19/19 178 lb 8 oz (81 kg)    Patient Active Problem List   Diagnosis Date Noted  . Long-term use of immunosuppressant medication 09/17/2018  . Low back pain 06/04/2018  . Controlled type 2 diabetes mellitus with complication, with long-term current use of insulin ([place]) 01/29/2017  . Essential hypertension 01/29/2017  . Crohn's ileocolitis ([place]) 01/29/2017  . Mixed hyperlipidemia 01/29/2017  . Leg cramps 01/29/2017  . History of skin cancer 01/29/2017    Past Medical History:  Diagnosis Date  . Crohn's disease without complication ([place]) 04/15/3975  . Diabetes mellitus without complication ([place])   . Dyslipidemia   . History of basal cell carcinoma   . History of melanoma   . History of mood disorder   . Hypertension   . Renal insufficiency     Past Surgical History:  Procedure Laterality Date  . COLONOSCOPY    . KNEE ARTHROSCOPY Right 1999  . MELANOMA EXCISION  1999   and squamous and basal cell excisions  . TONSILLECTOMY  1965    Social History   Tobacco Use  . Smoking status: Former Smoker    Types: Cigarettes    Quit date: 1995    Years since quitting: 26.4  .  Smokeless tobacco: Never Used  Substance Use Topics  . Alcohol use: Yes    Comment: occasional-1-2 per week  . Drug use: Never    Family History  Problem Relation Age of Onset  . Breast cancer Mother   . Heart attack Mother   . Cancer Brother        type unknown, mets  . Diabetes Brother   .  Crohn's disease Paternal Uncle   . Rheum arthritis Paternal Aunt     Allergies  Allergen Reactions  . Sulfa Antibiotics Anaphylaxis    "I got these purple spots everywhere"   . Ciprofloxacin   . Flagyl [Metronidazole]     GI effects   . Metformin And Related     Worsens his crohn disease sx, diarrhea   . Statins Other (See Comments)    Leg cramps    Medication list has been reviewed and updated.  Current Outpatient Medications on File Prior to Visit  Medication Sig Dispense Refill  . amLODipine (NORVASC) 10 MG tablet TAKE 1 TABLET (10 MG TOTAL) BY MOUTH DAILY. 90 tablet 1  . Blood Glucose Monitoring Suppl (FREESTYLE LITE) DEVI Dispense 1 glucose monitoring device.  Use as directed by MD 1 each 0  . Boswellia Serrata (BOSWELLIA PO) Take 1 tablet by mouth 2 (two) times daily.    . budesonide (ENTOCORT EC) 3 MG 24 hr capsule Take 3 capsules (9 mg total) by mouth daily. 90 capsule 1  . Cholecalciferol (VITAMIN D3 PO) Take by mouth. 2500 iu daily    . dapagliflozin propanediol (FARXIGA) 10 MG TABS tablet Take 10 mg by mouth daily. 90 tablet 3  . diclofenac sodium (VOLTAREN) 1 % GEL Apply 2 g topically 4 (four) times daily. Apply to hand joints as needed. Max 16 gm per joint and 32 grams total daily 100 g 2  . glucose blood (FREESTYLE LITE) test strip Use to test glucose 1-2x daily 100 each 12  . Icosapent Ethyl (VASCEPA) 1 g CAPS Take 2 capsules (2 g total) by mouth 2 (two) times daily. 360 capsule 3  . Insulin Glargine (BASAGLAR KWIKPEN) 100 UNIT/ML SOPN Inject 0.3 mLs (30 Units total) into the skin daily. 10 pen 3  . ipratropium (ATROVENT) 0.03 % nasal spray Place 1 spray into both nostrils daily.    . methocarbamol (ROBAXIN) 500 MG tablet Take 1 tablet (500 mg total) by mouth every 6 (six) hours as needed for muscle spasms. 30 tablet 0  . Multiple Vitamin (MULTIVITAMIN) tablet Take 1 tablet by mouth daily.    . Omega-3 Fatty Acids (FISH OIL PO) Take 4 g by mouth daily.    .  pioglitazone (ACTOS) 45 MG tablet Take 1 tablet (45 mg total) by mouth daily. 90 tablet 3  . Probiotic Product (VSL#3 PO) Take by mouth as needed.    . TECHLITE PEN NEEDLES 32G X 6 MM MISC USE ONCE DAILY 100 each PRN  . ustekinumab (STELARA) 90 MG/ML SOSY injection Inject 1 mL (90 mg total) into the skin every 8 (eight) weeks. 1 mL 0  . valsartan-hydrochlorothiazide (DIOVAN-HCT) 320-25 MG tablet TAKE 1 TABLET BY MOUTH DAILY. 90 tablet 1  . venlafaxine XR (EFFEXOR-XR) 37.5 MG 24 hr capsule TAKE 1 CAPSULE BY MOUTH DAILY WITH BREAKFAST 90 capsule 1   No current facility-administered medications on file prior to visit.    Review of Systems:  As per HPI- otherwise negative.   Physical Examination: Vitals:   05/19/20 0923  BP: 112/78  Pulse: 95  Resp: 16  Temp: (!) 97.3 F (36.3 C)  SpO2: 99%   Vitals:   05/19/20 0923  Weight: 187 lb (84.8 kg)  Height: 6' (1.829 m)   Body mass index is 25.36 kg/m. Ideal Body Weight: Weight in (lb) to have BMI = 25: 183.9  GEN: no acute distress.  Normal weight, looks well HEENT: Atraumatic, Normocephalic.  Ears and Nose: No external deformity. CV: RRR, No M/G/R. No JVD. No thrill. No extra heart sounds. PULM: CTA B, no wheezes, crackles, rhonchi. No retractions. No resp. distress. No accessory muscle use. ABD: S, NT, ND, +BS. No rebound. No HSM. EXTR: No c/c/e PSYCH: Normally interactive. Conversant.  Scar over left scapula from melanoma surgery-he does see his dermatologist regularly Foot exam normal  Assessment and Plan: Controlled type 2 diabetes mellitus with complication, with long-term current use of insulin ([place]) - Plan: metFORMIN (GLUCOPHAGE XR) 500 MG 24 hr tablet  Essential hypertension  Mixed hyperlipidemia  PND (post-nasal drip) - Plan: ipratropium (ATROVENT) 0.03 % nasal spray   Patient here today to follow-up on a couple of long-term concerns.  His A1c shows improvement, he would like to try a lower dose of Metformin to  see if he can tolerate.  We will have him try the extended release which may have fewer GI side effects.  He will let me know how this works for him  Blood pressure under good control on current regimen  Continue to encourage exercise and healthy diet Trial of Atrovent nasal for postnasal drainage and throat clearing especially after meals.  If this is not helpful, suggest trial of acid reducer  We discussed trying  PCSK9 inhibitor for his hyperlipidemia.  He will think about this and let me know if interested Otherwise, plan to visit in 6 months  Moderate medical decision making today This visit occurred during the SARS-CoV-2 public health emergency.  Safety protocols were in place, including screening questions prior to the visit, additional usage of staff PPE, and extensive cleaning of exam room while observing appropriate contact time as indicated for disinfecting solutions.    Signed Lamar Blinks, MD

## 2020-05-19 ENCOUNTER — Ambulatory Visit (INDEPENDENT_AMBULATORY_CARE_PROVIDER_SITE_OTHER): Payer: 59 | Admitting: Family Medicine

## 2020-05-19 ENCOUNTER — Encounter: Payer: Self-pay | Admitting: Family Medicine

## 2020-05-19 ENCOUNTER — Other Ambulatory Visit: Payer: Self-pay

## 2020-05-19 VITALS — BP 112/78 | HR 95 | Temp 97.3°F | Resp 16 | Ht 72.0 in | Wt 187.0 lb

## 2020-05-19 DIAGNOSIS — E782 Mixed hyperlipidemia: Secondary | ICD-10-CM | POA: Diagnosis not present

## 2020-05-19 DIAGNOSIS — E118 Type 2 diabetes mellitus with unspecified complications: Secondary | ICD-10-CM

## 2020-05-19 DIAGNOSIS — R0982 Postnasal drip: Secondary | ICD-10-CM

## 2020-05-19 DIAGNOSIS — Z794 Long term (current) use of insulin: Secondary | ICD-10-CM | POA: Diagnosis not present

## 2020-05-19 DIAGNOSIS — I10 Essential (primary) hypertension: Secondary | ICD-10-CM

## 2020-05-19 MED ORDER — IPRATROPIUM BROMIDE 0.03 % NA SOLN: 2.0000 | Freq: Three times a day (TID) | NASAL | 12 refills | Status: DC

## 2020-05-19 MED ORDER — METFORMIN HCL ER 500 MG PO TB24: 500.0000 mg | ORAL_TABLET | Freq: Every day | ORAL | 3 refills | Status: DC

## 2020-05-19 NOTE — Patient Instructions (Signed)
Great to see you today as always Let me know how you do on the metformin ER- if tolerated I will change the rx to a 3 month supply Please think about trying  PCSK9 inhibitor for cholesterol- let me know if you are interested in this  atrovent nasal prn for nasal dischage  Otherwise we can plan to visit in 4-6 months

## 2020-05-24 ENCOUNTER — Other Ambulatory Visit: Payer: Self-pay | Admitting: Internal Medicine

## 2020-05-24 ENCOUNTER — Other Ambulatory Visit: Payer: Self-pay | Admitting: Pharmacist

## 2020-05-24 MED ORDER — STELARA 90 MG/ML ~~LOC~~ SOSY: 90.0000 mg | PREFILLED_SYRINGE | SUBCUTANEOUS | 0 refills | Status: DC

## 2020-05-24 MED FILL — STELARA 90 MG/ML SOSY: 90 | 56 days supply | Qty: 1 | Fill #0

## 2020-05-24 MED FILL — VASCEPA 1 GM CAPSULE: 1 | 90 days supply | Qty: 360 | Fill #2

## 2020-05-24 MED FILL — PIOGLITAZONE HCL 45 MG TAB: 45 | 90 days supply | Qty: 90 | Fill #2

## 2020-06-15 MED FILL — BASAGLAR 100 UNIT/ML KWIKPE: 100 | 90 days supply | Qty: 27 | Fill #1

## 2020-06-18 DIAGNOSIS — L821 Other seborrheic keratosis: Secondary | ICD-10-CM | POA: Diagnosis not present

## 2020-06-18 DIAGNOSIS — L570 Actinic keratosis: Secondary | ICD-10-CM | POA: Diagnosis not present

## 2020-06-18 DIAGNOSIS — D485 Neoplasm of uncertain behavior of skin: Secondary | ICD-10-CM | POA: Diagnosis not present

## 2020-06-18 DIAGNOSIS — M720 Palmar fascial fibromatosis [Dupuytren]: Secondary | ICD-10-CM | POA: Diagnosis not present

## 2020-06-18 DIAGNOSIS — L814 Other melanin hyperpigmentation: Secondary | ICD-10-CM | POA: Diagnosis not present

## 2020-06-18 DIAGNOSIS — C44529 Squamous cell carcinoma of skin of other part of trunk: Secondary | ICD-10-CM | POA: Diagnosis not present

## 2020-06-18 DIAGNOSIS — D1801 Hemangioma of skin and subcutaneous tissue: Secondary | ICD-10-CM | POA: Diagnosis not present

## 2020-06-18 DIAGNOSIS — D225 Melanocytic nevi of trunk: Secondary | ICD-10-CM | POA: Diagnosis not present

## 2020-06-18 DIAGNOSIS — L738 Other specified follicular disorders: Secondary | ICD-10-CM | POA: Diagnosis not present

## 2020-06-18 DIAGNOSIS — Z85820 Personal history of malignant melanoma of skin: Secondary | ICD-10-CM | POA: Diagnosis not present

## 2020-06-18 MED FILL — AZELAIC ACID 15 % GEL: 15 | 30 days supply | Qty: 50 | Fill #0

## 2020-07-05 ENCOUNTER — Other Ambulatory Visit: Payer: Self-pay | Admitting: Family Medicine

## 2020-07-05 DIAGNOSIS — I10 Essential (primary) hypertension: Secondary | ICD-10-CM

## 2020-07-05 MED FILL — FARXIGA 10 MG TABLET: 10 | 90 days supply | Qty: 90 | Fill #0

## 2020-07-05 MED FILL — AMLODIPINE BESYLATE 10 MG T: 10 | 90 days supply | Qty: 90 | Fill #0

## 2020-07-05 MED FILL — VENLAFAXINE HCL ER 37.5 MG: 37.5 | 90 days supply | Qty: 90 | Fill #0

## 2020-07-05 MED FILL — metFORMIN HCL ER 500 MG TB2: 500 | 30 days supply | Qty: 30 | Fill #1

## 2020-07-05 MED FILL — VALSARTAN-HCTZ 320-25 MG TA: 320-25 | 90 days supply | Qty: 90 | Fill #0

## 2020-07-19 ENCOUNTER — Other Ambulatory Visit: Payer: Self-pay | Admitting: Internal Medicine

## 2020-07-19 MED ORDER — STELARA 90 MG/ML ~~LOC~~ SOSY: 90.0000 mg | PREFILLED_SYRINGE | SUBCUTANEOUS | 2 refills | Status: DC

## 2020-07-20 ENCOUNTER — Other Ambulatory Visit: Payer: Self-pay | Admitting: Pharmacist

## 2020-07-20 MED ORDER — STELARA 90 MG/ML ~~LOC~~ SOSY: 90.0000 mg | PREFILLED_SYRINGE | SUBCUTANEOUS | 2 refills | Status: DC

## 2020-07-22 ENCOUNTER — Telehealth: Payer: Self-pay | Admitting: Internal Medicine

## 2020-07-22 DIAGNOSIS — L988 Other specified disorders of the skin and subcutaneous tissue: Secondary | ICD-10-CM | POA: Diagnosis not present

## 2020-07-22 DIAGNOSIS — C44529 Squamous cell carcinoma of skin of other part of trunk: Secondary | ICD-10-CM | POA: Diagnosis not present

## 2020-07-22 NOTE — Telephone Encounter (Signed)
Spoke with Manuela Schwartz at [facility] - she will refax prior authorization request

## 2020-07-23 NOTE — Telephone Encounter (Signed)
Received approval from [employer] on 07/23/20  Prior Authorization Reference Number: [id number] Effective for a maximum of 6 refills from 07/23/20 - 07/23/19

## 2020-07-23 NOTE — Telephone Encounter (Signed)
Correction - effective 07/23/20-07/22/21

## 2020-07-23 NOTE — Telephone Encounter (Signed)
Lm on patient's voicemail letting him know that we have started the prior authorization process and should have response by next week

## 2020-07-23 NOTE — Telephone Encounter (Signed)
Prior authorization has been started in Cover My Meds on 07/23/20

## 2020-07-27 MED FILL — STELARA 90 MG/ML SOSY: 90 | 56 days supply | Qty: 1 | Fill #0

## 2020-07-30 MED FILL — TECHLITE PEN NDL 32GX1/4: 32G X 6 MM | 90 days supply | Qty: 100 | Fill #1

## 2020-08-07 ENCOUNTER — Ambulatory Visit: Payer: 59 | Attending: Internal Medicine

## 2020-08-07 DIAGNOSIS — Z23 Encounter for immunization: Secondary | ICD-10-CM

## 2020-08-07 NOTE — Progress Notes (Signed)
   Covid-19 Vaccination Clinic  Name:  Jorgen Wolfinger    MRN: [id number] DOB: [birth year]  08/07/2020  Mr. Thum was observed post Covid-19 immunization for 15 minutes without incident. He was provided with Vaccine Information Sheet and instruction to access the V-Safe system.   Mr. Mcbroom was instructed to call [phone] with any severe reactions post vaccine: Marland Kitchen Difficulty breathing  . Swelling of face and throat  . A fast heartbeat  . A bad rash all over body  . Dizziness and weakness

## 2020-08-27 MED FILL — metFORMIN HCL ER 500 MG TB2: 500 | 30 days supply | Qty: 30 | Fill #2

## 2020-08-27 MED FILL — VASCEPA 1 GM CAPSULE: 1 | 90 days supply | Qty: 360 | Fill #3

## 2020-08-27 MED FILL — PIOGLITAZONE HCL 45 MG TAB: 45 | 90 days supply | Qty: 90 | Fill #3

## 2020-09-20 MED FILL — VENLAFAXINE HCL ER 37.5 MG: 37.5 | 90 days supply | Qty: 90 | Fill #1

## 2020-09-20 MED FILL — BASAGLAR 100 UNIT/ML KWIKPE: 100 | 90 days supply | Qty: 27 | Fill #2

## 2020-09-22 MED FILL — STELARA 90 MG/ML SOSY: 90 | 56 days supply | Qty: 1 | Fill #1

## 2020-10-14 ENCOUNTER — Encounter: Payer: Self-pay | Admitting: Family Medicine

## 2020-10-14 DIAGNOSIS — Z111 Encounter for screening for respiratory tuberculosis: Secondary | ICD-10-CM

## 2020-10-14 DIAGNOSIS — I10 Essential (primary) hypertension: Secondary | ICD-10-CM

## 2020-10-14 DIAGNOSIS — E782 Mixed hyperlipidemia: Secondary | ICD-10-CM

## 2020-10-14 DIAGNOSIS — Z794 Long term (current) use of insulin: Secondary | ICD-10-CM

## 2020-10-14 DIAGNOSIS — E118 Type 2 diabetes mellitus with unspecified complications: Secondary | ICD-10-CM

## 2020-10-14 NOTE — Telephone Encounter (Signed)
Ok to have him just come in for labs or would you need to see him?

## 2020-10-15 ENCOUNTER — Other Ambulatory Visit (INDEPENDENT_AMBULATORY_CARE_PROVIDER_SITE_OTHER): Payer: 59

## 2020-10-15 ENCOUNTER — Other Ambulatory Visit: Payer: Self-pay

## 2020-10-15 DIAGNOSIS — I10 Essential (primary) hypertension: Secondary | ICD-10-CM | POA: Diagnosis not present

## 2020-10-15 DIAGNOSIS — Z794 Long term (current) use of insulin: Secondary | ICD-10-CM | POA: Diagnosis not present

## 2020-10-15 DIAGNOSIS — Z111 Encounter for screening for respiratory tuberculosis: Secondary | ICD-10-CM | POA: Diagnosis not present

## 2020-10-15 DIAGNOSIS — E118 Type 2 diabetes mellitus with unspecified complications: Secondary | ICD-10-CM

## 2020-10-15 DIAGNOSIS — E782 Mixed hyperlipidemia: Secondary | ICD-10-CM | POA: Diagnosis not present

## 2020-10-18 ENCOUNTER — Encounter: Payer: Self-pay | Admitting: Family Medicine

## 2020-10-19 ENCOUNTER — Encounter: Payer: Self-pay | Admitting: Family Medicine

## 2020-10-20 NOTE — Addendum Note (Signed)
Addended by: Kelle Darting A on: 10/20/2020 08:24 AM   Modules accepted: Orders

## 2020-10-21 ENCOUNTER — Other Ambulatory Visit (INDEPENDENT_AMBULATORY_CARE_PROVIDER_SITE_OTHER): Payer: 59

## 2020-10-21 ENCOUNTER — Telehealth: Payer: Self-pay | Admitting: *Deleted

## 2020-10-21 ENCOUNTER — Other Ambulatory Visit: Payer: Self-pay

## 2020-10-21 ENCOUNTER — Encounter: Payer: Self-pay | Admitting: Family Medicine

## 2020-10-21 DIAGNOSIS — Z111 Encounter for screening for respiratory tuberculosis: Secondary | ICD-10-CM

## 2020-10-21 LAB — COMPREHENSIVE METABOLIC PANEL
AG Ratio: 1.5 (calc) (ref 1.0–2.5)
ALT: 13 U/L (ref 9–46)
AST: 17 U/L (ref 10–35)
Albumin: 4.1 g/dL (ref 3.6–5.1)
Alkaline phosphatase (APISO): 38 U/L (ref 35–144)
BUN/Creatinine Ratio: 23 (calc) — ABNORMAL HIGH (ref 6–22)
BUN: 27 mg/dL — ABNORMAL HIGH (ref 7–25)
CO2: 30 mmol/L (ref 20–32)
Calcium: 9.1 mg/dL (ref 8.6–10.3)
Chloride: 97 mmol/L — ABNORMAL LOW (ref 98–110)
Creat: 1.18 mg/dL (ref 0.70–1.25)
Globulin: 2.7 g/dL (calc) (ref 1.9–3.7)
Glucose, Bld: 178 mg/dL — ABNORMAL HIGH (ref 65–99)
Potassium: 3.9 mmol/L (ref 3.5–5.3)
Sodium: 139 mmol/L (ref 135–146)
Total Bilirubin: 0.5 mg/dL (ref 0.2–1.2)
Total Protein: 6.8 g/dL (ref 6.1–8.1)

## 2020-10-21 LAB — EXTRA SPECIMEN

## 2020-10-21 LAB — LIPID PANEL
Cholesterol: 257 mg/dL — ABNORMAL HIGH (ref <200)
HDL: 43 mg/dL (ref >=40)
LDL Cholesterol (Calc): 165 mg/dL (calc) — ABNORMAL HIGH
Non-HDL Cholesterol (Calc): 214 mg/dL (calc) — ABNORMAL HIGH (ref <130)
Total CHOL/HDL Ratio: 6 (calc) — ABNORMAL HIGH (ref <5.0)
Triglycerides: 317 mg/dL — ABNORMAL HIGH (ref <150)

## 2020-10-21 LAB — EXTRA LAV TOP TUBE

## 2020-10-21 LAB — HEMOGLOBIN A1C
Hgb A1c MFr Bld: 6.9 % of total Hgb — ABNORMAL HIGH (ref <5.7)
Mean Plasma Glucose: 151 (calc)
eAG (mmol/L): 8.4 (calc)

## 2020-10-21 LAB — QUANTIFERON-TB GOLD PLUS

## 2020-10-21 LAB — TEST AUTHORIZATION

## 2020-10-21 NOTE — Telephone Encounter (Signed)
Pt returned for TB Gold today. He was asking about liver testing. His liver enzymes were normal in the most recent CMP but he said he is on immunosuppressant medications and thought he might normally get a little more extensive liver testing?  I drew an extra SST tube if he needs something additional that might require a chemistry tube.  Please advise.

## 2020-10-22 MED FILL — metFORMIN HCL ER 500 MG TB2: 500 | 30 days supply | Qty: 30 | Fill #3

## 2020-10-22 MED FILL — BUDESONIDE 3 MG CAP: 3 | 30 days supply | Qty: 90 | Fill #1

## 2020-10-22 MED FILL — AMLODIPINE BESYLATE 10 MG T: 10 | 90 days supply | Qty: 90 | Fill #1

## 2020-10-22 MED FILL — VALSARTAN-HCTZ 320-25 MG TA: 320-25 | 90 days supply | Qty: 90 | Fill #1

## 2020-10-22 MED FILL — FARXIGA 10 MG TABLET: 10 | 90 days supply | Qty: 90 | Fill #1

## 2020-10-23 LAB — QUANTIFERON-TB GOLD PLUS
Mitogen-NIL: 8.13 IU/mL
NIL: 0.02 IU/mL
QuantiFERON-TB Gold Plus: NEGATIVE
TB1-NIL: 0 IU/mL
TB2-NIL: 0 IU/mL

## 2020-11-15 DIAGNOSIS — M720 Palmar fascial fibromatosis [Dupuytren]: Secondary | ICD-10-CM | POA: Diagnosis not present

## 2020-11-16 ENCOUNTER — Other Ambulatory Visit: Payer: Self-pay | Admitting: Family Medicine

## 2020-11-16 DIAGNOSIS — Z794 Long term (current) use of insulin: Secondary | ICD-10-CM

## 2020-11-16 DIAGNOSIS — E118 Type 2 diabetes mellitus with unspecified complications: Secondary | ICD-10-CM

## 2020-11-16 MED FILL — PIOGLITAZONE HCL 45 MG TAB: 45 | 30 days supply | Qty: 30 | Fill #0

## 2020-11-16 MED FILL — TECHLITE PEN NDL 32GX1/4: 32G X 6 MM | 90 days supply | Qty: 100 | Fill #2

## 2020-11-16 MED FILL — metFORMIN HCL ER 500 MG TB2: 500 | 30 days supply | Qty: 30 | Fill #0

## 2020-11-17 MED FILL — STELARA 90 MG/ML SOSY: 90 | 56 days supply | Qty: 1 | Fill #2

## 2020-11-21 NOTE — Patient Instructions (Addendum)
It was great to see you again today, happy holidays!  First dose of shingles vaccine today- 2nd dose 2-6 months We will check your lipids today and see how we look  Please do get your eye exam at your earliest convenience   Work on fitting exercise into your routine

## 2020-11-21 NOTE — Progress Notes (Signed)
[place] [facility], [place], [place] [address] [phone]  Date:  11/24/2020   Name:  Thomas Peterson   DOB:  [birth year]   MRN:  [id number]  PCP:  Darreld Mclean, MD    Chief Complaint: Diabetes (All 90 day supply refill) and chrons disease (flare)   History of Present Illness:  Thomas Peterson is a 59 y.o. 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  He typically will take Entocort for about a week when he has a flare  Most recent A1c in November, showed good control at 6.9% He saw dermatology over the summer for management of squamous cell skin cancers- he had an excision for treatment   His gastroenterologist is Dr. GCarlean PurlFlu vaccine- done Eye exam-due this year COVID-19 vaccine complete- done  Shingrix- mentioned to pt  Labs completed in November-patient does not tolerate statins, we had touched on a possible PCSK9 inhibitor for him He is fasting this am - would like to recheck his lipids as he knows he was not fasting at last lipid panel.  If cholesterol still high he would be willing to consider PCSK9  He is taking vascepa omega 3 supplement now - not taking zetia or statin due to intolerance currently    Wt Readings from Last 3 Encounters:  11/24/20 189 lb (85.7 kg)  05/19/20 187 lb (84.8 kg)  10/06/19 184 lb 6 oz (83.6 kg)     Patient Active Problem List   Diagnosis Date Noted  . Long-term use of  immunosuppressant medication 09/17/2018  . Low back pain 06/04/2018  . Controlled type 2 diabetes mellitus with complication, with long-term current use of insulin ([place] 01/29/2017  . Essential hypertension 01/29/2017  . Crohn's ileocolitis ([place] 01/29/2017  . Mixed hyperlipidemia 01/29/2017  . Leg cramps 01/29/2017  . History of skin cancer 01/29/2017    Past Medical History:  Diagnosis Date  . Crohn's disease without complication ([place] 29/37/1696 . Diabetes mellitus without complication ([place]   . Dyslipidemia   . History of basal cell carcinoma   . History of melanoma   . History of mood disorder   . Hypertension   . Renal insufficiency     Past Surgical History:  Procedure Laterality Date  . COLONOSCOPY    . KNEE ARTHROSCOPY Right 1999  . MELANOMA EXCISION  1999   and squamous and basal cell excisions  . TONSILLECTOMY  1965    Social History   Tobacco Use  . Smoking status: Former Smoker    Types: Cigarettes    Quit date: 1995    Years since quitting: 26.9  . Smokeless tobacco: Never Used  Vaping Use  . Vaping Use: Never used  Substance Use Topics  . Alcohol use: Yes    Comment: occasional-1-2 per week  . Drug use: Never    Family History  Problem Relation Age of Onset  . Breast cancer Mother   . Heart attack Mother   . Cancer Brother  type unknown, mets  . Diabetes Brother   . Crohn's disease Paternal Uncle   . Rheum arthritis Paternal Aunt     Allergies  Allergen Reactions  . Sulfa Antibiotics Anaphylaxis    "I got these purple spots everywhere"   . Ciprofloxacin   . Flagyl [Metronidazole]     GI effects   . Metformin And Related     Worsens his crohn disease sx, diarrhea   . Statins Other (See Comments)    Leg cramps    Medication list has been reviewed and updated.  Current Outpatient Medications on File Prior to Visit  Medication Sig Dispense Refill  . amLODipine (NORVASC) 10 MG tablet TAKE 1 TABLET BY MOUTH ONCE DAILY 90 tablet  1  . Blood Glucose Monitoring Suppl (FREESTYLE LITE) DEVI Dispense 1 glucose monitoring device.  Use as directed by MD 1 each 0  . Boswellia Serrata (BOSWELLIA PO) Take 1 tablet by mouth 2 (two) times daily.    . budesonide (ENTOCORT EC) 3 MG 24 hr capsule Take 3 capsules (9 mg total) by mouth daily. 90 capsule 1  . Cholecalciferol (VITAMIN D3 PO) Take by mouth. 2500 iu daily    . diclofenac sodium (VOLTAREN) 1 % GEL Apply 2 g topically 4 (four) times daily. Apply to hand joints as needed. Max 16 gm per joint and 32 grams total daily 100 g 2  . FARXIGA 10 MG TABS tablet TAKE 1 TABLET BY MOUTH ONCE DAILY 90 tablet 3  . glucose blood (FREESTYLE LITE) test strip Use to test glucose 1-2x daily 100 each 12  . Icosapent Ethyl (VASCEPA) 1 g CAPS Take 2 capsules (2 g total) by mouth 2 (two) times daily. 360 capsule 3  . Insulin Glargine (BASAGLAR KWIKPEN) 100 UNIT/ML SOPN Inject 0.3 mLs (30 Units total) into the skin daily. 10 pen 3  . ipratropium (ATROVENT) 0.03 % nasal spray Place 2 sprays into both nostrils 3 (three) times daily. Use as needed for nasal drainage 30 mL 12  . metFORMIN (GLUCOPHAGE-XR) 500 MG 24 hr tablet Take 1 tablet (500 mg total) by mouth daily with breakfast. 30 tablet 0  . Multiple Vitamin (MULTIVITAMIN) tablet Take 1 tablet by mouth daily.    . Omega-3 Fatty Acids (FISH OIL PO) Take 4 g by mouth daily.    . pioglitazone (ACTOS) 45 MG tablet Take 1 tablet (45 mg total) by mouth daily. 30 tablet 0  . Probiotic Product (VSL#3 PO) Take by mouth as needed.    . TECHLITE PEN NEEDLES 32G X 6 MM MISC USE ONCE DAILY 100 each PRN  . ustekinumab (STELARA) 90 MG/ML SOSY injection Inject 1 mL (90 mg total) into the skin every 8 (eight) weeks. 1 mL 2  . valsartan-hydrochlorothiazide (DIOVAN-HCT) 320-25 MG tablet TAKE 1 TABLET BY MOUTH DAILY. 90 tablet 1  . venlafaxine XR (EFFEXOR-XR) 37.5 MG 24 hr capsule TAKE 1 CAPSULE BY MOUTH DAILY WITH BREAKFAST 90 capsule 1   No current  facility-administered medications on file prior to visit.    Review of Systems:  As per HPI- otherwise negative. Admits he does not get as much exercise as he would like No chest pain or shortness of breath  Physical Examination: Vitals:   11/24/20 0844  BP: 112/82  Pulse: 85  Resp: 17  SpO2: 95%   Vitals:   11/24/20 0844  Weight: 189 lb (85.7 kg)  Height: 6' (1.829 m)   Body mass index is 25.63 kg/m. Ideal Body Weight: Weight  in (lb) to have BMI = 25: 183.9  GEN: no acute distress.  Normal weight, looks well HEENT: Atraumatic, Normocephalic.   Bilateral TM wnl, oropharynx normal.  PEERL,EOMI.   Ears and Nose: No external deformity. CV: RRR, No M/G/R. No JVD. No thrill. No extra heart sounds. PULM: CTA B, no wheezes, crackles, rhonchi. No retractions. No resp. distress. No accessory muscle use. ABD: S, NT, ND, +BS. No rebound. No HSM. EXTR: No c/c/e PSYCH: Normally interactive. Conversant.    Assessment and Plan: Mixed hyperlipidemia - Plan: Lipid panel  Immunization due - Plan: Varicella-zoster vaccine IM (Shingrix)  Patient today for 92-monthfollow-up visit He continues to manage his Crohn's disease along with gastroenterology Gave first dose of shingles vaccine today, second dose 2 to 6 months Check lipids today, if still elevated consider a PCSK9 This visit occurred during the SARS-CoV-2 public health emergency.  Safety protocols were in place, including screening questions prior to the visit, additional usage of staff PPE, and extensive cleaning of exam room while observing appropriate contact time as indicated for disinfecting solutions.    Signed JLamar Blinks MD  Received his lipids as below, message to patient  Results for orders placed or performed in visit on 11/24/20  Lipid panel  Result Value Ref Range   Cholesterol 262 (H) 0 - 200 mg/dL   Triglycerides 169.0 (H) 0.0 - 149.0 mg/dL   HDL 41.40 >39.00 mg/dL   VLDL 33.8 0.0 - 40.0 mg/dL   LDL  Cholesterol 187 (H) 0 - 99 mg/dL   Total CHOL/HDL Ratio 6    NonHDL 220.98

## 2020-11-24 ENCOUNTER — Encounter: Payer: Self-pay | Admitting: Family Medicine

## 2020-11-24 ENCOUNTER — Ambulatory Visit: Payer: 59 | Admitting: Family Medicine

## 2020-11-24 ENCOUNTER — Other Ambulatory Visit: Payer: Self-pay

## 2020-11-24 VITALS — BP 112/82 | HR 85 | Resp 17 | Ht 72.0 in | Wt 189.0 lb

## 2020-11-24 DIAGNOSIS — Z23 Encounter for immunization: Secondary | ICD-10-CM

## 2020-11-24 DIAGNOSIS — E782 Mixed hyperlipidemia: Secondary | ICD-10-CM | POA: Diagnosis not present

## 2020-11-24 LAB — LIPID PANEL
Cholesterol: 262 mg/dL — ABNORMAL HIGH (ref 0–200)
HDL: 41.4 mg/dL (ref >39.00)
LDL Cholesterol: 187 mg/dL — ABNORMAL HIGH (ref 0–99)
NonHDL: 220.98
Total CHOL/HDL Ratio: 6
Triglycerides: 169 mg/dL — ABNORMAL HIGH (ref 0.0–149.0)
VLDL: 33.8 mg/dL (ref 0.0–40.0)

## 2020-12-15 ENCOUNTER — Other Ambulatory Visit: Payer: Self-pay | Admitting: Family Medicine

## 2020-12-15 MED ORDER — REPATHA SURECLICK 140 MG/ML ~~LOC~~ SOAJ: 140.0000 mg | SUBCUTANEOUS | 6 refills | Status: DC

## 2020-12-17 ENCOUNTER — Encounter: Payer: Self-pay | Admitting: Family Medicine

## 2020-12-20 ENCOUNTER — Telehealth: Payer: Self-pay

## 2020-12-20 MED FILL — REPATHA SURECLICK 140 MG/ML: 140 | 28 days supply | Qty: 2 | Fill #0

## 2020-12-20 NOTE — Telephone Encounter (Signed)
PA initiated via Covermymeds; KEY: [id number]. Awaiting determination.

## 2020-12-20 NOTE — Telephone Encounter (Signed)
PA approved.   The request has been approved. The authorization is effective for a maximum of 12 fills from 12/20/2020 to 12/19/2021, as long as the member is enrolled in their current health plan. The request was approved as submitted. A written notification letter will follow with additional details.

## 2020-12-24 ENCOUNTER — Other Ambulatory Visit: Payer: Self-pay | Admitting: Family Medicine

## 2020-12-24 DIAGNOSIS — E118 Type 2 diabetes mellitus with unspecified complications: Secondary | ICD-10-CM

## 2020-12-24 DIAGNOSIS — Z794 Long term (current) use of insulin: Secondary | ICD-10-CM

## 2020-12-24 MED FILL — BASAGLAR 100 UNIT/ML KWIKPE: 100 | 90 days supply | Qty: 27 | Fill #3

## 2020-12-24 MED FILL — metFORMIN HCL ER 500 MG TB2: 500 | 30 days supply | Qty: 30 | Fill #0

## 2021-01-10 ENCOUNTER — Ambulatory Visit: Payer: 59 | Admitting: Internal Medicine

## 2021-01-10 ENCOUNTER — Encounter: Payer: Self-pay | Admitting: Internal Medicine

## 2021-01-10 ENCOUNTER — Other Ambulatory Visit: Payer: Self-pay | Admitting: Family Medicine

## 2021-01-10 VITALS — BP 114/72 | HR 80 | Ht 71.65 in | Wt 194.2 lb

## 2021-01-10 DIAGNOSIS — K5080 Crohn's disease of both small and large intestine without complications: Secondary | ICD-10-CM | POA: Diagnosis not present

## 2021-01-10 DIAGNOSIS — K645 Perianal venous thrombosis: Secondary | ICD-10-CM | POA: Diagnosis not present

## 2021-01-10 DIAGNOSIS — Z79899 Other long term (current) drug therapy: Secondary | ICD-10-CM | POA: Diagnosis not present

## 2021-01-10 DIAGNOSIS — E118 Type 2 diabetes mellitus with unspecified complications: Secondary | ICD-10-CM

## 2021-01-10 DIAGNOSIS — Z794 Long term (current) use of insulin: Secondary | ICD-10-CM

## 2021-01-10 DIAGNOSIS — Z7960 Long term (current) use of unspecified immunomodulators and immunosuppressants: Secondary | ICD-10-CM

## 2021-01-10 MED FILL — PIOGLITAZONE HCL 45 MG TAB: 45 | 90 days supply | Qty: 90 | Fill #0

## 2021-01-10 NOTE — Patient Instructions (Signed)
It has been recommended to you by your physician that you have a(n) colonoscopy completed. Per your request, we did not schedule the procedure(s) today. Please contact our office at [phone] should you decide to have the procedure completed. You will be scheduled for a pre-visit and procedure at that time.   Due to recent changes in healthcare laws, you may see the results of your imaging and laboratory studies on MyChart before your provider has had a chance to review them.  We understand that in some cases there may be results that are confusing or concerning to you. Not all laboratory results come back in the same time frame and the provider may be waiting for multiple results in order to interpret others.  Please give [place] 48 hours in order for your provider to thoroughly review all the results before contacting the office for clarification of your results.    I appreciate the opportunity to care for you. Silvano Rusk, MD, [facility]

## 2021-01-10 NOTE — Assessment & Plan Note (Addendum)
Reassess with colonoscopy.  CBC normal last summer.  Overall sounds like he has been doing fairly well on the Stelara though he recently started some budesonide or increase in abdominal pain.  He will call back to set a date.

## 2021-01-10 NOTE — Progress Notes (Signed)
Rigby Balfour 60 y.o. [birth year] [id number]  Assessment & Plan:   Crohn's ileocolitis ([place]) Reassess with colonoscopy.  CBC normal last summer.  Overall sounds like he has been doing fairly well on the Stelara though he recently started some budesonide or increase in abdominal pain.  He will call back to set a date.  Long-term use of immunosuppressant medication-Stelara No procedures or demonstrated side effects.  QuantiFERON up-to-date - November 2021.  Thrombosed external hemorrhoid This is much improved.  The nature and pathophysiology of thrombosed hemorrhoids was discussed with the patient.  Symptomatic treatment will continue.   I appreciate the opportunity to care for this patient. 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  A little bit of bright red blood per rectum with wiping with toilet paper that is not soft.  The symptoms are really much much better.  He did not have any purulent discharge.  It was not very painful which is large he thought it was a hemorrhoid but thought he ought to be checked.  He has been doing fairly well on Stelara since early 2020 for his Crohn's disease after being on other Biologics.  He is for the first time in a long time using some budesonide due to some achiness in his abdomen.  Last colonoscopy in 2019 with active colonic disease.  Terminal ileal biopsies were normal then.  A CT enterography was then done which also did not show active small bowel disease.   Wt Readings from Last 3 Encounters:  01/10/21 194 lb 4 oz (88.1 kg)  11/24/20 189 lb (85.7 kg)  05/19/20 187 lb (84.8 kg)      Allergies  Allergen Reactions  . Sulfa Antibiotics Anaphylaxis    "I got these purple spots everywhere"   .  Ciprofloxacin   . Flagyl [Metronidazole]     GI effects   . Metformin And Related     Worsens his crohn disease sx, diarrhea   . Statins Other (See Comments)    Leg cramps   Current Meds  Medication Sig  . amLODipine (NORVASC) 10 MG tablet TAKE 1 TABLET BY MOUTH ONCE DAILY  . Blood Glucose Monitoring Suppl (FREESTYLE LITE) DEVI Dispense 1 glucose monitoring device.  Use as directed by MD  . Steva Colder (BOSWELLIA PO) Take 1 tablet by mouth 2 (two) times daily.  . budesonide (ENTOCORT EC) 3 MG 24 hr capsule Take 3 capsules (9 mg total) by mouth daily.  . Cholecalciferol (VITAMIN D3 PO) Take by mouth. 2500 iu daily  . diclofenac sodium (VOLTAREN) 1 % GEL Apply 2 g topically 4 (four) times daily. Apply to hand joints as needed. Max 16 gm per joint and 32 grams total daily  . Evolocumab (REPATHA SURECLICK) 761 MG/ML SOAJ Inject 140 mg into the skin every 14 (fourteen) days.  Marland Kitchen FARXIGA 10 MG TABS tablet TAKE 1 TABLET BY MOUTH ONCE DAILY  . glucose blood (FREESTYLE LITE) test strip Use to test glucose 1-2x daily  . Insulin Glargine (BASAGLAR KWIKPEN) 100 UNIT/ML SOPN Inject 0.3 mLs (30 Units total) into the skin daily.  Marland Kitchen ipratropium (ATROVENT) 0.03 % nasal spray Place 2 sprays into both nostrils 3 (three) times daily. Use as needed for nasal drainage  . metFORMIN (GLUCOPHAGE-XR)  500 MG 24 hr tablet TAKE 1 TABLET (500 MG TOTAL) BY MOUTH DAILY WITH BREAKFAST.  . Multiple Vitamin (MULTIVITAMIN) tablet Take 1 tablet by mouth daily.  . Omega-3 Fatty Acids (FISH OIL PO) Take 4 g by mouth daily.  . Probiotic Product (VSL#3 PO) Take by mouth as needed.  . TECHLITE PEN NEEDLES 32G X 6 MM MISC USE ONCE DAILY  . ustekinumab (STELARA) 90 MG/ML SOSY injection Inject 1 mL (90 mg total) into the skin every 8 (eight) weeks.  . valsartan-hydrochlorothiazide (DIOVAN-HCT) 320-25 MG tablet TAKE 1 TABLET BY MOUTH DAILY.  Marland Kitchen venlafaxine XR (EFFEXOR-XR) 37.5 MG 24 hr capsule TAKE 1 CAPSULE BY MOUTH DAILY WITH  BREAKFAST  . [DISCONTINUED] pioglitazone (ACTOS) 45 MG tablet Take 1 tablet (45 mg total) by mouth daily.   Past Medical History:  Diagnosis Date  . Crohn's disease without complication ([place]) 9/98/3382  . Diabetes mellitus without complication ([place])   . Dyslipidemia   . History of basal cell carcinoma   . History of melanoma   . History of mood disorder   . Hypertension   . Renal insufficiency    Past Surgical History:  Procedure Laterality Date  . COLONOSCOPY    . KNEE ARTHROSCOPY Right 1999  . MELANOMA EXCISION  1999   and squamous and basal cell excisions  . TONSILLECTOMY  36   Social History   Social History Narrative   Married one daughter born 1996 she lives in [place] working in Education administrator, second career, [employer] radiation therapy, employed by 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   Review of Systems As above  Objective:   Physical Exam BP 114/72 (BP Location: Left Arm, Patient Position: Sitting, Cuff Size: Normal)   Pulse 80 Comment: irregular  Ht 5' 11.65" (1.82 m) Comment: height measured without shoes  Wt 194 lb 4 oz (88.1 kg)   BMI 26.60 kg/m  Well-developed well-nourished no acute distress Lungs clear Normal heart sound S1-S2 no rubs murmurs or gallops Abdomen is soft nontender without organomegaly or mass  Rectal exam shows some slight bulge to the right posterior external hemorrhoid complex, is a little bit tender and indurated there on digital exam with no mass and normal prostate.  Anoscopy is done confirming minimally thrombosed right posterior external hemorrhoid.

## 2021-01-10 NOTE — Assessment & Plan Note (Signed)
No procedures or demonstrated side effects.  QuantiFERON up-to-date - November 2021.

## 2021-01-10 NOTE — Assessment & Plan Note (Signed)
This is much improved.  The nature and pathophysiology of thrombosed hemorrhoids was discussed with the patient.  Symptomatic treatment will continue.

## 2021-01-11 ENCOUNTER — Telehealth: Payer: Self-pay | Admitting: Internal Medicine

## 2021-01-11 DIAGNOSIS — K5080 Crohn's disease of both small and large intestine without complications: Secondary | ICD-10-CM

## 2021-01-11 NOTE — Telephone Encounter (Signed)
Pt called stating that he was returning your call. He did not disclose more details just stated that you would know what it is about. Pls call him.

## 2021-01-11 NOTE — Telephone Encounter (Signed)
Thomas Peterson called back to set up his colonoscopy.

## 2021-01-17 ENCOUNTER — Telehealth: Payer: Self-pay | Admitting: Pharmacist

## 2021-01-17 ENCOUNTER — Other Ambulatory Visit: Payer: Self-pay

## 2021-01-17 ENCOUNTER — Ambulatory Visit (HOSPITAL_BASED_OUTPATIENT_CLINIC_OR_DEPARTMENT_OTHER): Payer: 59 | Admitting: Pharmacist

## 2021-01-17 DIAGNOSIS — Z79899 Other long term (current) drug therapy: Secondary | ICD-10-CM

## 2021-01-17 NOTE — Telephone Encounter (Signed)
Called patient to schedule an appointment for the [facility]. I was unable to reach the patient so I left a HIPAA-compliant message requesting that the patient return my call.   Luke Van Ausdall, PharmD, BCACP, CPP Clinical Pharmacist [facility] [phone]  

## 2021-01-17 NOTE — Progress Notes (Signed)
   S: Patient presents for review of their specialty medication therapy.  Patient is currently taking Stelara for Crohn's disease. Patient is managed by Dr. Carlean Purl for this.   Adherence: confirms   Efficacy: reports that it does okay, however, he has a colonoscopy planned for 02/18/2021 and reports that there is potential for a change in therapy.   Dosing:  Crohn disease:  Maintenance: SubQ: 90 mg every 8 weeks.  Screening: TB test: completed per patient Hepatitis: completed per patient  Monitoring: S/sx of infection: denies CBC: see below Reversible posterior leukoencephalopathy syndrome (RPLS - sx include headache, seizures, confusion, and visual disturbances): denies Squamous cell skin carcinoma: denies  Dosage form specific issues: . Latex: Packaging may contain natural latex rubber. . Polysorbate 80: Some dosage forms may contain polysorbate 80 (also known as Tweens). Hypersensitivity reactions, usually a delayed reaction, have been reported following exposure to pharmaceutical products containing polysorbate 80 in certain individuals Dolly Rias, 2002; Lucente 2000; Lollie Marrow, [place]). Thrombocytopenia, ascites, pulmonary deterioration, and renal and hepatic failure have been reported in premature neonates after receiving parenteral products containing polysorbate 80 (Alade, 1986; CDC, 1984). See manufacturer's labeling.  O:  Lab Results  Component Value Date   WBC 6.6 05/14/2020   HGB 16.6 05/14/2020   HCT 48.0 05/14/2020   MCV 90.6 05/14/2020   PLT 349.0 05/14/2020      Chemistry      Component Value Date/Time   NA 139 10/15/2020 1408   K 3.9 10/15/2020 1408   CL 97 (L) 10/15/2020 1408   CO2 30 10/15/2020 1408   BUN 27 (H) 10/15/2020 1408   CREATININE 1.18 10/15/2020 1408      Component Value Date/Time   CALCIUM 9.1 10/15/2020 1408   ALKPHOS 43 05/14/2020 0738   AST 17 10/15/2020 1408   ALT 13 10/15/2020 1408   BILITOT 0.5 10/15/2020 1408      A/P: 1.  Medication review: patient currently prescribed Stelara for Crohn's disease but he has not started it yet. Has recently failed Humira. Reviewed the medication with the patient, including the following: Stelara, ustekinumab, is a TNF? blocker. Patient educated on purpose, proper use and potential adverse effects of Stelara.  Following instruction patient verbalized understanding of treatment plan. There is an increased risk of infection and malignancy with this medication. Do not give patients live vaccinations while they are on this medication. No recommendations for any changes.  Benard Halsted, PharmD, [facility], [place] [phone]

## 2021-01-19 ENCOUNTER — Encounter: Payer: Self-pay | Admitting: Family Medicine

## 2021-01-19 ENCOUNTER — Other Ambulatory Visit: Payer: Self-pay | Admitting: Family Medicine

## 2021-01-19 DIAGNOSIS — Z794 Long term (current) use of insulin: Secondary | ICD-10-CM

## 2021-01-19 DIAGNOSIS — E118 Type 2 diabetes mellitus with unspecified complications: Secondary | ICD-10-CM

## 2021-01-19 MED ORDER — METFORMIN HCL ER 500 MG PO TB24: 500.0000 mg | ORAL_TABLET | Freq: Every day | ORAL | 3 refills | Status: DC

## 2021-01-20 ENCOUNTER — Telehealth: Payer: Self-pay

## 2021-01-20 MED FILL — metFORMIN HCL ER 500 MG TB2: 500 | 90 days supply | Qty: 90 | Fill #0

## 2021-01-20 NOTE — Telephone Encounter (Signed)
PA initiated via Covermymeds; KEY: [id number]. PA cancelled by plan.  Member should be able to get the drug/product without a PA at this time.

## 2021-01-21 ENCOUNTER — Other Ambulatory Visit: Payer: Self-pay | Admitting: Internal Medicine

## 2021-01-21 ENCOUNTER — Other Ambulatory Visit: Payer: Self-pay | Admitting: Pharmacist

## 2021-01-21 MED ORDER — STELARA 90 MG/ML ~~LOC~~ SOSY
90.0000 mg | PREFILLED_SYRINGE | SUBCUTANEOUS | 2 refills | Status: DC
Start: 2021-01-21 — End: 2021-01-21

## 2021-01-24 MED FILL — STELARA 90 MG/ML SOSY: 90 | 56 days supply | Qty: 1 | Fill #0

## 2021-01-28 ENCOUNTER — Other Ambulatory Visit: Payer: Self-pay | Admitting: Family Medicine

## 2021-01-28 DIAGNOSIS — I10 Essential (primary) hypertension: Secondary | ICD-10-CM

## 2021-01-28 MED FILL — REPATHA SURECLICK 140 MG/ML: 140 | 28 days supply | Qty: 2 | Fill #1

## 2021-01-28 MED FILL — VALSARTAN-HCTZ 320-25 MG TA: 320-25 | 90 days supply | Qty: 90 | Fill #0

## 2021-01-28 MED FILL — AMLODIPINE BESYLATE 10 MG T: 10 | 90 days supply | Qty: 90 | Fill #0

## 2021-01-28 MED FILL — FARXIGA 10 MG TABLET: 10 | 90 days supply | Qty: 90 | Fill #2

## 2021-01-28 MED FILL — VENLAFAXINE HCL ER 37.5 MG: 37.5 | 90 days supply | Qty: 90 | Fill #0

## 2021-02-11 ENCOUNTER — Encounter: Payer: Self-pay | Admitting: Internal Medicine

## 2021-02-17 NOTE — Telephone Encounter (Signed)
Inbound call from patient stating not sure if he should keep his appt for colonoscopy and is requesting a call back please to discuss.

## 2021-02-17 NOTE — Telephone Encounter (Signed)
Thomas Peterson has a sore throat, no fever, negative covid test yesterday. Per Barb Merino, CGRN okay to proceed with colonoscopy unless develops a fever or symptoms worsen.

## 2021-02-18 ENCOUNTER — Ambulatory Visit (AMBULATORY_SURGERY_CENTER): Payer: 59 | Admitting: Internal Medicine

## 2021-02-18 ENCOUNTER — Other Ambulatory Visit: Payer: Self-pay

## 2021-02-18 ENCOUNTER — Other Ambulatory Visit: Payer: Self-pay | Admitting: Internal Medicine

## 2021-02-18 ENCOUNTER — Encounter: Payer: Self-pay | Admitting: Internal Medicine

## 2021-02-18 VITALS — BP 116/75 | HR 76 | Temp 97.1°F | Resp 19 | Ht 71.0 in | Wt 194.0 lb

## 2021-02-18 DIAGNOSIS — K50118 Crohn's disease of large intestine with other complication: Secondary | ICD-10-CM | POA: Diagnosis not present

## 2021-02-18 DIAGNOSIS — K5090 Crohn's disease, unspecified, without complications: Secondary | ICD-10-CM | POA: Diagnosis not present

## 2021-02-18 DIAGNOSIS — I10 Essential (primary) hypertension: Secondary | ICD-10-CM | POA: Diagnosis not present

## 2021-02-18 DIAGNOSIS — K635 Polyp of colon: Secondary | ICD-10-CM | POA: Diagnosis not present

## 2021-02-18 DIAGNOSIS — K5140 Inflammatory polyps of colon without complications: Secondary | ICD-10-CM | POA: Diagnosis not present

## 2021-02-18 DIAGNOSIS — D123 Benign neoplasm of transverse colon: Secondary | ICD-10-CM

## 2021-02-18 DIAGNOSIS — K5080 Crohn's disease of both small and large intestine without complications: Secondary | ICD-10-CM

## 2021-02-18 MED ORDER — SODIUM CHLORIDE 0.9 % IV SOLN: 500.0000 mL | Freq: Once | INTRAVENOUS | Status: DC

## 2021-02-18 NOTE — Progress Notes (Signed)
Report given to PACU, vss 

## 2021-02-18 NOTE — Op Note (Signed)
Thomas Peterson Patient Name: Thomas Peterson Procedure Date: 02/18/2021 4:11 PM MRN: [id number] Endoscopist: Gatha Mayer , MD Age: 60 Referring MD:  Date of Birth: Jun 15, 1961 Gender: Male Account #: [id number] Procedure:                Colonoscopy Indications:              Crohn's disease of the small bowel and colon,                            Follow-up of Crohn's disease of the small bowel and                            colon, Disease activity assessment of Crohn's                            disease of the small bowel and colon, Assess                            therapeutic response to therapy of Crohn's disease                            of the small bowel and colon Medicines:                Propofol per Anesthesia, Monitored Anesthesia Care Procedure:                Pre-Anesthesia Assessment:                           - Prior to the procedure, a History and Physical                            was performed, and patient medications and                            allergies were reviewed. The patient's tolerance of                            previous anesthesia was also reviewed. The risks                            and benefits of the procedure and the sedation                            options and risks were discussed with the patient.                            All questions were answered, and informed consent                            was obtained. Prior Anticoagulants: The patient has                            taken no previous anticoagulant or antiplatelet  agents. ASA Grade Assessment: II - A patient with                            mild systemic disease. After reviewing the risks                            and benefits, the patient was deemed in                            satisfactory condition to undergo the procedure.                           After obtaining informed consent, the colonoscope                            was passed under  direct vision. Throughout the                            procedure, the patient's blood pressure, pulse, and                            oxygen saturations were monitored continuously. The                            Olympus CF-HQ190L [id number]) Colonoscope was                            introduced through the anus and advanced to the the                            terminal ileum, with identification of the                            appendiceal orifice and IC valve. The colonoscopy                            was performed without difficulty. The patient                            tolerated the procedure well. The quality of the                            bowel preparation was good. The terminal ileum,                            ileocecal valve, appendiceal orifice, and rectum                            were photographed. The bowel preparation used was                            Miralax via split dose instruction. Scope In: 4:26:41 PM Scope Out: 4:49:16 PM Scope Withdrawal Time: 0 hours 20 minutes 16 seconds  Total Procedure  Duration: 0 hours 22 minutes 35 seconds  Findings:                 The perianal and digital rectal examinations were                            normal. Pertinent negatives include normal prostate                            (size, shape, and consistency).                           The terminal ileum appeared normal. Biopsies were                            taken with a cold forceps for histology.                            Verification of patient identification for the                            specimen was done. Estimated blood loss was minimal.                           Two sessile polyps were found in the transverse                            colon and proximal transverse colon. The polyps                            were diminutive in size. These polyps were removed                            with a cold snare. Resection and retrieval were                             complete. Verification of patient identification                            for the specimen was done. Estimated blood loss was                            minimal.                           A patchy area of mildly erythematous mucosa was                            found in the entire colon. Biopsies were taken with                            a cold forceps for histology. Verification of                            patient identification for  the specimen was done.                            Estimated blood loss was minimal.                           The exam was otherwise without abnormality on                            direct and retroflexion views. Complications:            No immediate complications. Estimated Blood Loss:     Estimated blood loss was minimal. Impression:               - The examined portion of the ileum was normal.                            Biopsied.                           - Two diminutive polyps in the transverse colon and                            in the proximal transverse colon, removed with a                            cold snare. Resected and retrieved. normal tissue                            rings resected around polyps                           - Erythematous mucosa in the entire examined colon.                            Biopsied. Ptchy erythema no true endoscopic colitis                            - looks good. Biopses from cecum/ascending;                            transverse; descending/prox sigmoid; distal                            sigmoid/rectum                           - The examination was otherwise normal on direct                            and retroflexion views. Recommendation:           - Patient has a contact number available for                            emergencies. The signs and symptoms of potential  delayed complications were discussed with the                            patient. Return to normal activities  tomorrow.                            Written discharge instructions were provided to the                            patient.                           - Resume previous diet.                           - Continue present medications.                           - Repeat colonoscopy is recommended. The                            colonoscopy date will be determined after pathology                            results from today's exam become available for                            review. Gatha Mayer, MD 02/18/2021 5:04:58 PM This report has been signed electronically.

## 2021-02-18 NOTE — Progress Notes (Signed)
Called to room to assist during endoscopic procedure.  Patient ID and intended procedure confirmed with present staff. Received instructions for my participation in the procedure from the performing physician.  

## 2021-02-18 NOTE — Patient Instructions (Addendum)
I removed 2 tiny polyps today.  Normal see what they were they could have just been some scar tissue or pseudopolyps from prior inflammation versus some polyps.  Again they were tiny not to worry.  The colon overall looks good other than some patchy red spots.  The terminal ileum also looked normal.  I took the usual biopsies.  I will give much when the results come in.  I appreciate the opportunity to care for you.  Gatha Mayer, MD, [facility]  Polyp handout given to patient.  YOU HAD AN ENDOSCOPIC PROCEDURE TODAY AT [facility]:   Refer to the procedure report that was given to you for any specific questions about what was found during the examination.  If the procedure report does not answer your questions, please call your gastroenterologist to clarify.  If you requested that your care partner not be given the details of your procedure findings, then the procedure report has been included in a sealed envelope for you to review at your convenience later.  YOU SHOULD EXPECT: Some feelings of bloating in the abdomen. Passage of more gas than usual.  Walking can help get rid of the air that was put into your GI tract during the procedure and reduce the bloating. If you had a lower endoscopy (such as a colonoscopy or flexible sigmoidoscopy) you may notice spotting of blood in your stool or on the toilet paper. If you underwent a bowel prep for your procedure, you may not have a normal bowel movement for a few days.  Please Note:  You might notice some irritation and congestion in your nose or some drainage.  This is from the oxygen used during your procedure.  There is no need for concern and it should clear up in a day or so.  SYMPTOMS TO REPORT IMMEDIATELY:   Following lower endoscopy (colonoscopy or flexible sigmoidoscopy):  Excessive amounts of blood in the stool  Significant tenderness or worsening of abdominal pains  Swelling of the abdomen that is new, acute  Fever of  100F or higher  For urgent or emergent issues, a gastroenterologist can be reached at any hour by calling [phone]. Do not use MyChart messaging for urgent concerns.    DIET:  We do recommend a small meal at first, but then you may proceed to your regular diet.  Drink plenty of fluids but you should avoid alcoholic beverages for 24 hours.  ACTIVITY:  You should plan to take it easy for the rest of today and you should NOT DRIVE or use heavy machinery until tomorrow (because of the sedation medicines used during the test).    FOLLOW UP: Our staff will call the number listed on your records 48-72 hours following your procedure to check on you and address any questions or concerns that you may have regarding the information given to you following your procedure. If we do not reach you, we will leave a message.  We will attempt to reach you two times.  During this call, we will ask if you have developed any symptoms of COVID 19. If you develop any symptoms (ie: fever, flu-like symptoms, shortness of breath, cough etc.) before then, please call [phone].  If you test positive for Covid 19 in the 2 weeks post procedure, please call and report this information to [place].    If any biopsies were taken you will be contacted by phone or by letter within the next 1-3 weeks.  Please call us  at [phone] if you have not heard about the biopsies in 3 weeks.    SIGNATURES/CONFIDENTIALITY: You and/or your care partner have signed paperwork which will be entered into your electronic medical record.  These signatures attest to the fact that that the information above on your After Visit Summary has been reviewed and is understood.  Full responsibility of the confidentiality of this discharge information lies with you and/or your care-partner.

## 2021-02-23 ENCOUNTER — Ambulatory Visit: Payer: 59

## 2021-02-23 ENCOUNTER — Telehealth: Payer: Self-pay | Admitting: *Deleted

## 2021-02-23 NOTE — Telephone Encounter (Signed)
  Follow up Call-  Call back number 02/18/2021 10/28/2018  Post procedure Call Back phone  # [phone]  Permission to leave phone message Yes Yes  Some recent data might be hidden     Patient questions:  Do you have a fever, pain , or abdominal swelling? No. Pain Score  0 *  Have you tolerated food without any problems? Yes.    Have you been able to return to your normal activities? Yes.    Do you have any questions about your discharge instructions: Diet   No. Medications  No. Follow up visit  No.  Do you have questions or concerns about your Care? No.  Actions: * If pain score is 4 or above: No action needed, pain <4.   1. Have you developed a fever since your procedure? no  2.   Have you had an respiratory symptoms (SOB or cough) since your procedure? no  3.   Have you tested positive for COVID 19 since your procedure no  4.   Have you had any family members/close contacts diagnosed with the COVID 19 since your procedure?  no   If yes to any of these questions please route to Joylene John, RN and Joella Prince, RN

## 2021-03-04 ENCOUNTER — Other Ambulatory Visit: Payer: Self-pay | Admitting: Internal Medicine

## 2021-03-04 ENCOUNTER — Encounter: Payer: Self-pay | Admitting: Internal Medicine

## 2021-03-04 DIAGNOSIS — K5080 Crohn's disease of both small and large intestine without complications: Secondary | ICD-10-CM

## 2021-03-08 ENCOUNTER — Other Ambulatory Visit: Payer: Self-pay | Admitting: Family Medicine

## 2021-03-08 DIAGNOSIS — Z794 Long term (current) use of insulin: Secondary | ICD-10-CM

## 2021-03-08 DIAGNOSIS — E118 Type 2 diabetes mellitus with unspecified complications: Secondary | ICD-10-CM

## 2021-03-08 MED FILL — REPATHA SURECLICK 140 MG/ML: 140 | 28 days supply | Qty: 2 | Fill #2

## 2021-03-08 MED FILL — TECHLITE PEN NDL 32GX1/4: 32G X 6 MM | 90 days supply | Qty: 100 | Fill #3

## 2021-03-09 ENCOUNTER — Encounter: Payer: Self-pay | Admitting: Family Medicine

## 2021-03-09 ENCOUNTER — Other Ambulatory Visit: Payer: Self-pay | Admitting: Family Medicine

## 2021-03-09 MED FILL — BASAGLAR 100 UNIT/ML KWIKPE: 100 | 90 days supply | Qty: 27 | Fill #0

## 2021-03-10 ENCOUNTER — Other Ambulatory Visit: Payer: Self-pay

## 2021-03-10 ENCOUNTER — Ambulatory Visit (INDEPENDENT_AMBULATORY_CARE_PROVIDER_SITE_OTHER): Payer: 59

## 2021-03-10 ENCOUNTER — Other Ambulatory Visit (HOSPITAL_COMMUNITY): Payer: Self-pay

## 2021-03-10 DIAGNOSIS — Z23 Encounter for immunization: Secondary | ICD-10-CM | POA: Diagnosis not present

## 2021-03-10 NOTE — Progress Notes (Signed)
Thomas Peterson is a 60 y.o. male presents to the office today for Shingrix injections, his first one was done 11-24-2020. Injection administered today on left deltoid. Patient tolerated injection.   [name]

## 2021-03-14 ENCOUNTER — Other Ambulatory Visit: Payer: 59

## 2021-03-14 DIAGNOSIS — K5080 Crohn's disease of both small and large intestine without complications: Secondary | ICD-10-CM

## 2021-03-17 ENCOUNTER — Ambulatory Visit: Payer: 59 | Attending: Internal Medicine

## 2021-03-17 ENCOUNTER — Other Ambulatory Visit (HOSPITAL_COMMUNITY): Payer: Self-pay

## 2021-03-17 ENCOUNTER — Other Ambulatory Visit: Payer: Self-pay | Admitting: Pharmacist

## 2021-03-17 DIAGNOSIS — Z23 Encounter for immunization: Secondary | ICD-10-CM

## 2021-03-17 MED ORDER — STELARA 90 MG/ML ~~LOC~~ SOSY
PREFILLED_SYRINGE | SUBCUTANEOUS | 1 refills | Status: DC
  Filled 2021-03-17: qty 1, 56d supply, fill #0
  Filled 2021-03-17: qty 1, fill #0

## 2021-03-17 NOTE — Progress Notes (Signed)
   Covid-19 Vaccination Clinic  Name:  Dian Minahan    MRN: [id number] DOB: 1961-08-26  03/17/2021  Mr. Ruesch was observed post Covid-19 immunization for 15 minutes without incident. He was provided with Vaccine Information Sheet and instruction to access the V-Safe system.   Mr. Liebler was instructed to call 911 with any severe reactions post vaccine: Marland Kitchen Difficulty breathing  . Swelling of face and throat  . A fast heartbeat  . A bad rash all over body  . Dizziness and weakness   Immunizations Administered    Name Date Dose VIS Date Route   Moderna Covid-19 Booster Vaccine 03/17/2021  9:07 AM 0.25 mL 09/29/2020 Intramuscular   Manufacturer: Levan Hurst   Lot: [id number]   [place]: [id number]

## 2021-03-18 ENCOUNTER — Other Ambulatory Visit (HOSPITAL_COMMUNITY): Payer: Self-pay

## 2021-03-19 LAB — CALPROTECTIN: Calprotectin: 225 mcg/g — ABNORMAL HIGH

## 2021-03-21 ENCOUNTER — Other Ambulatory Visit (HOSPITAL_COMMUNITY): Payer: Self-pay

## 2021-03-22 ENCOUNTER — Other Ambulatory Visit (HOSPITAL_BASED_OUTPATIENT_CLINIC_OR_DEPARTMENT_OTHER): Payer: Self-pay

## 2021-03-22 ENCOUNTER — Other Ambulatory Visit: Payer: Self-pay | Admitting: Internal Medicine

## 2021-03-22 MED ORDER — BUDESONIDE 3 MG PO CPEP
ORAL_CAPSULE | Freq: Every day | ORAL | 1 refills | Status: DC
  Filled 2021-03-22: qty 90, 30d supply, fill #0
  Filled 2021-05-04: qty 90, 30d supply, fill #1

## 2021-03-24 ENCOUNTER — Other Ambulatory Visit: Payer: Self-pay | Admitting: Internal Medicine

## 2021-03-24 ENCOUNTER — Other Ambulatory Visit (HOSPITAL_BASED_OUTPATIENT_CLINIC_OR_DEPARTMENT_OTHER): Payer: Self-pay

## 2021-03-24 DIAGNOSIS — K5080 Crohn's disease of both small and large intestine without complications: Secondary | ICD-10-CM

## 2021-03-24 MED ORDER — COVID-19 MRNA VACC (MODERNA) 100 MCG/0.5ML IM SUSP
INTRAMUSCULAR | 0 refills | Status: DC
  Filled 2021-03-24: qty 0.25, 1d supply, fill #0

## 2021-03-24 MED ORDER — MESALAMINE 1.2 G PO TBEC
2.4000 g | DELAYED_RELEASE_TABLET | Freq: Every day | ORAL | 3 refills | Status: DC
  Filled 2021-03-24: qty 180, 90d supply, fill #0

## 2021-03-25 ENCOUNTER — Other Ambulatory Visit (HOSPITAL_BASED_OUTPATIENT_CLINIC_OR_DEPARTMENT_OTHER): Payer: Self-pay

## 2021-04-01 NOTE — Telephone Encounter (Signed)
See message f rom patient

## 2021-04-09 MED FILL — Evolocumab Subcutaneous Soln Auto-Injector 140 MG/ML: SUBCUTANEOUS | 28 days supply | Qty: 2 | Fill #0 | Status: AC

## 2021-04-09 MED FILL — Pioglitazone HCl Tab 45 MG (Base Equiv): ORAL | 90 days supply | Qty: 90 | Fill #0 | Status: AC

## 2021-04-11 ENCOUNTER — Other Ambulatory Visit (HOSPITAL_BASED_OUTPATIENT_CLINIC_OR_DEPARTMENT_OTHER): Payer: Self-pay

## 2021-04-14 ENCOUNTER — Encounter: Payer: Self-pay | Admitting: Internal Medicine

## 2021-04-26 ENCOUNTER — Encounter: Payer: Self-pay | Admitting: Internal Medicine

## 2021-04-29 ENCOUNTER — Other Ambulatory Visit: Payer: Self-pay | Admitting: Internal Medicine

## 2021-04-29 ENCOUNTER — Encounter: Payer: Self-pay | Admitting: Internal Medicine

## 2021-04-29 ENCOUNTER — Ambulatory Visit: Payer: 59 | Admitting: Internal Medicine

## 2021-04-29 VITALS — BP 122/80 | HR 83 | Ht 71.0 in | Wt 185.0 lb

## 2021-04-29 DIAGNOSIS — Z79899 Other long term (current) drug therapy: Secondary | ICD-10-CM | POA: Diagnosis not present

## 2021-04-29 DIAGNOSIS — K50818 Crohn's disease of both small and large intestine with other complication: Secondary | ICD-10-CM

## 2021-04-29 DIAGNOSIS — E118 Type 2 diabetes mellitus with unspecified complications: Secondary | ICD-10-CM

## 2021-04-29 DIAGNOSIS — Z7960 Long term (current) use of unspecified immunomodulators and immunosuppressants: Secondary | ICD-10-CM

## 2021-04-29 DIAGNOSIS — Z794 Long term (current) use of insulin: Secondary | ICD-10-CM | POA: Diagnosis not present

## 2021-04-29 NOTE — Assessment & Plan Note (Signed)
This is a difficult situation.  He reports that in the past 6-MP worked really well but then he even had to go on allopurinol to drive up the levels later due to a suspected change in metabolism of it.  It sounds like that that did not really work.  He is not really having a great response to Stelara having needed budesonide off and on.  It sounds like mesalamine did not work out too well again.  Plan is to switch to [facility] risks benefits and indications explained  I would see him back after that sometime this fall most likely  Continue budesonide for the time being  Explore dietitian options  I mailed him a Crohn's diet sheet but I think it could be worthwhile exploring diet help

## 2021-04-29 NOTE — Patient Instructions (Addendum)
Stay on the budesonide.  We will order Entyvio infusions as discussed.  Stay tuned for more info and will plan a follow-up for Fall after Weyman Rodney is started.  I will look into dietitian options and get back.  I appreciate the opportunity to care for you. Gatha Mayer, MD, [facility]

## 2021-04-29 NOTE — Progress Notes (Signed)
Thomas Peterson 60 y.o. [birth year] [id number]  Assessment & Plan:   Encounter Diagnoses  Name Primary?  . Crohn's disease of both small and large intestine with other complication ([place]) Yes  . Long-term use of immunosuppressant medication-Stelara   . 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  He reports that in the past 6-MP worked really well but then he even had to go on allopurinol to drive up the levels later due to a suspected change in metabolism of it.  It sounds like that that did not really work.  He is not really having a great response to Stelara having needed budesonide off and on.  It sounds like mesalamine did not work out too well again.  Plan is to switch to [facility] risks benefits and indications explained  I would see him back after that sometime this fall most likely  Continue budesonide for the time being  Explore dietitian options  I mailed him a Crohn's diet sheet but I think it could be worthwhile exploring diet help   I appreciate the opportunity to care for this patient. CC: [name], Gay Filler, MD   Subjective:   Chief Complaint: Follow-up of Crohn's disease  HPI Thomas Peterson is a 60 year old man with Crohn's disease of the small and large intestine who has been on numerous medications over the years (see previous notes) more recently on Stelara.  Colonoscopy in March showed patchy erythema and a normal pilling ileum with biopsies as below.   Diagnosis 1. Surgical [P], small bowel, terminal ileum - ILEAL MUCOSA WITH NO SIGNIFICANT PATHOLOGIC FINDINGS. - NEGATIVE FOR ACTIVE INFLAMMATION, GRANULOMATA AND DYSPLASIA. 2. Surgical [P], colon, ascending and cecum - MILDLY ACTIVE CHRONIC COLITIS WITH SCATTERED POORLY FORMED NONNECROTIZING MICROGRANULOMATA, CONSISTENT WITH CROHN'S DISEASE. - NEGATIVE FOR DYSPLASIA. 3. Surgical [P], colon,  tranverse - MILDLY ACTIVE CHRONIC COLITIS WITH SCATTERED POORLY FORMED NONNECROTIZING MICROGRANULOMATA, CONSISTENT WITH CROHN'S DISEASE. - NEGATIVE FOR DYSPLASIA. 4. Surgical [P], colon, transverse, polyp (2) - INFLAMMATORY POLYP (X MULTIPLE). - NEGATIVE FOR DYSPLASIA. 5. Surgical [P], colon, descending and proximal sigmoid - INACTIVE CHRONIC COLITIS WITH ISOLATED POORLY FORMED MICROGRANULOMA, CONSISTENT WITH CROHN'S DISEASE. - NEGATIVE FOR DYSPLASIA. 6. Surgical [P], colon, distal sigmoid and rectum - COLONIC MUCOSA WITH NO SIGNIFICANT PATHOLOGIC FINDINGS. - NEGATIVE FOR ACTIVE INFLAMMATION, GRANULOMATA AND DYSPLASIA.  Fecal calprotectin elevated at 225 in April.  He has been having some bloating with abdominal pain and alteration in bowel habits and 5 days ago went back on budesonide regularly.  Over the years its been hard for him to have a medication work long-term.  I had tried him on mesalamine but he felt worse so we stopped that.  And then symptoms returned so he went back on budesonide.   Allergies  Allergen Reactions  . Sulfa Antibiotics Anaphylaxis    "I got these purple spots everywhere"   . Ciprofloxacin   . Flagyl [Metronidazole]     GI effects   . Metformin And Related     Worsens his crohn disease sx, diarrhea   . Statins Other (See Comments)    Leg cramps  . Mesalamine Other (See Comments)    Gastrointestinal distress   Current Meds  Medication Sig  . amLODipine (NORVASC) 10 MG tablet TAKE 1 TABLET (10 MG TOTAL) BY MOUTH DAILY.  Marland Kitchen Blood Glucose Monitoring Suppl (FREESTYLE LITE) DEVI Dispense 1 glucose monitoring device.  Use  as directed by MD  . Steva Colder (BOSWELLIA PO) Take 1 tablet by mouth 2 (two) times daily.  . budesonide (ENTOCORT EC) 3 MG 24 hr capsule TAKE 3 CAPSULES BY MOUTH DAILY  . Cholecalciferol (VITAMIN D3 PO) Take by mouth. 2500 iu daily  . COVID-19 mRNA vaccine, Moderna, 100 MCG/0.5ML injection Inject into the muscle.  . dapagliflozin  propanediol (FARXIGA) 10 MG TABS tablet TAKE 1 TABLET BY MOUTH ONCE DAILY  . diclofenac sodium (VOLTAREN) 1 % GEL Apply 2 g topically 4 (four) times daily. Apply to hand joints as needed. Max 16 gm per joint and 32 grams total daily  . Evolocumab 140 MG/ML SOAJ INJECT 140 MG INTO THE SKIN EVERY 14 DAYS  . glucose blood (FREESTYLE LITE) test strip Use to test glucose 1-2x daily  . Insulin Glargine (BASAGLAR KWIKPEN) 100 UNIT/ML INJECT 0.3 MLS (30 UNITS TOTAL) INTO THE SKIN DAILY.  Marland Kitchen Insulin Pen Needle 32G X 6 MM MISC USE ONCE DAILY  . ipratropium (ATROVENT) 0.03 % nasal spray Place 2 sprays into both nostrils 3 (three) times daily. Use as needed for nasal drainage  . mesalamine (LIALDA) 1.2 g EC tablet Take 2 tablets (2.4 g total) by mouth daily with breakfast.  . metFORMIN (GLUCOPHAGE-XR) 500 MG 24 hr tablet TAKE 1 TABLET (500 MG TOTAL) BY MOUTH DAILY WITH BREAKFAST.  . Multiple Vitamin (MULTIVITAMIN) tablet Take 1 tablet by mouth daily.  . Omega-3 Fatty Acids (FISH OIL PO) Take 4 g by mouth daily.  . pioglitazone (ACTOS) 45 MG tablet TAKE 1 TABLET BY MOUTH ONCE DAILY  . Probiotic Product (VSL#3 PO) Take by mouth as needed.  . ustekinumab (STELARA) 90 MG/ML SOSY injection INJECT 1 ML (90 MG TOTAL) INTO THE SKIN EVERY 8 (EIGHT) WEEKS.  . valsartan-hydrochlorothiazide (DIOVAN-HCT) 320-25 MG tablet TAKE 1 TABLET BY MOUTH DAILY.  Marland Kitchen venlafaxine XR (EFFEXOR-XR) 37.5 MG 24 hr capsule TAKE 1 CAPSULE (37.5 MG TOTAL) BY MOUTH DAILY WITH BREAKFAST.   Past Medical History:  Diagnosis Date  . Arthritis 2020   left hand  . Crohn's disease without complication ([place]) 7/93/9030  . Diabetes mellitus without complication ([place])   . Dyslipidemia   . History of basal cell carcinoma   . History of melanoma   . History of mood disorder   . Hyperlipidemia   . Hypertension    Past Surgical History:  Procedure Laterality Date  . COLONOSCOPY    . KNEE ARTHROSCOPY Right 1999  . MELANOMA EXCISION  1999   and  squamous and basal cell excisions  . TONSILLECTOMY  62   Social History   Social History Narrative   Married one daughter born 1996 she lives in [place] working in Education administrator, second career, 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   Review of Systems As above  Objective:   Physical Exam BP 122/80   Pulse 83   Ht 5' 11"  (1.803 m)   Wt 185 lb (83.9 kg)   SpO2 96%   BMI 25.80 kg/m  Abdomen is soft NT

## 2021-04-29 NOTE — Progress Notes (Signed)
Patient to change from Stelara to [facility] because of failure with Stelara.  Has active disease despite that and has failed infliximab, 6-MP and Humira.

## 2021-05-02 ENCOUNTER — Telehealth: Payer: Self-pay | Admitting: Pharmacy Technician

## 2021-05-02 NOTE — Telephone Encounter (Signed)
Auth Submission: PENDING/PRIOR AUTH REQUIRED Payer: [employer] Medication & CPT/J Code(s) submitted: Entyvio (Vedolizumab) [id number] Route of submission (phone, fax, portal): PHONE: [phone] FAX [phone] Buy/Bill Units/visits requested: 9 Reference number: [id number]   Will update once we receive a response.

## 2021-05-04 MED FILL — Venlafaxine HCl Cap ER 24HR 37.5 MG (Base Equivalent): ORAL | 90 days supply | Qty: 90 | Fill #0 | Status: AC

## 2021-05-04 MED FILL — Metformin HCl Tab ER 24HR 500 MG: ORAL | 90 days supply | Qty: 90 | Fill #0 | Status: AC

## 2021-05-04 MED FILL — Dapagliflozin Propanediol Tab 10 MG (Base Equivalent): ORAL | 90 days supply | Qty: 90 | Fill #0 | Status: AC

## 2021-05-05 ENCOUNTER — Other Ambulatory Visit (HOSPITAL_BASED_OUTPATIENT_CLINIC_OR_DEPARTMENT_OTHER): Payer: Self-pay

## 2021-05-05 ENCOUNTER — Encounter: Payer: Self-pay | Admitting: Internal Medicine

## 2021-05-13 ENCOUNTER — Other Ambulatory Visit (HOSPITAL_COMMUNITY): Payer: Self-pay

## 2021-05-16 ENCOUNTER — Other Ambulatory Visit (HOSPITAL_COMMUNITY): Payer: Self-pay

## 2021-05-16 NOTE — Telephone Encounter (Addendum)
Auth Submission: APPROVED Payer: [employer] Medication & CPT/J Code(s) submitted: X2820 - ENTYVIO Route of submission (phone, fax, portal): PHONE/FAX PHONE [phone] FAX [phone] Auth type: BUY/BILL Units/visits requested: 9 Approval date: 05/02/21 - 11/02/21 Reference number: [id number]

## 2021-05-17 ENCOUNTER — Other Ambulatory Visit (HOSPITAL_COMMUNITY): Payer: Self-pay

## 2021-05-19 NOTE — Progress Notes (Addendum)
[facility] [place], [place], [place] [address] [phone]  Date:  05/23/2021   Name:  Thomas Peterson   DOB:  Apr 02, 1961   MRN:  [id number]  PCP:  Darreld Mclean, MD    Chief Complaint: Annual Exam   History of Present Illness:  Thomas Peterson is a 60 y.o. very pleasant male patient who presents with the following:  Here today for a routine physical Most recent visit with myself was in December  History of hypertension, controlled diabetes, Crohn's disease, hyperlipidemia, melanoma His Crohn's disease does sometimes require steroids, which can make diabetes control more challenging  His Crohn's disease is managed by his gastroenterologist, Dr. Carlean Purl- he is not controlled with stellara and they plan to try a new medication for him  He is using his budesinide quite a bit recently- this does work but of course has SE   He has historically not tolerated statins-has tried several different options.  He also had leg cramps with Zetia and vascepa  We got him on Repatha - He has done 6 doses so far and just recently started to get the same leg cramps   He is getting diarrhea with metformin - he is taking ER 500 mg. 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9 (H) 10/15/2020    Patient Active Problem List   Diagnosis Date Noted   Thrombosed external hemorrhoid 01/10/2021   Long-term use of immunosuppressant medication-Stelara 09/17/2018   Low back pain 06/04/2018   Controlled type 2 diabetes mellitus with complication, with long-term current use of insulin ([place]) 01/29/2017   Essential hypertension 01/29/2017   Crohn's ileocolitis ([place]) 01/29/2017   Mixed hyperlipidemia 01/29/2017   Leg cramps 01/29/2017   History of skin cancer 01/29/2017    Past Medical History:  Diagnosis Date   Arthritis 2020   left hand   Crohn's disease without complication ([facility]) 9/77/4142   Diabetes mellitus without complication ([facility])    Dyslipidemia    History of basal cell carcinoma    History of melanoma    History of mood disorder    Hyperlipidemia    Hypertension     Past Surgical History:  Procedure Laterality Date   COLONOSCOPY     KNEE ARTHROSCOPY Right [place]   and squamous and basal cell excisions   TONSILLECTOMY  1965    Social History   Tobacco Use   Smoking status: Former    Pack years: 0.00    Types: Cigarettes    Quit date:  1995    Years since quitting: 27.4   Smokeless tobacco: Never  Vaping Use   Vaping Use: Never used  Substance Use Topics   Alcohol use: Yes    Comment: occasional-1-2 per week   Drug use: Never    Family History  Problem Relation Age of Onset   Breast cancer Mother    Heart attack Mother    Cancer Brother        type unknown, mets   Diabetes Brother    Crohn's disease Paternal Uncle    Rheum arthritis Paternal Aunt    Stomach cancer Neg Hx    Colon cancer Neg Hx    Esophageal cancer Neg Hx     Allergies  Allergen Reactions   Sulfa Antibiotics Anaphylaxis    "I got these purple spots everywhere"    Ciprofloxacin    Flagyl [Metronidazole]     GI effects    Metformin And Related      Worsens his crohn disease sx, diarrhea    Statins Other (See Comments)    Leg cramps   Mesalamine Other (See Comments)    Gastrointestinal distress    Medication list has been reviewed and updated.  Current Outpatient Medications on File Prior to Visit  Medication Sig Dispense Refill   amLODipine (NORVASC) 10 MG tablet TAKE 1 TABLET (10 MG TOTAL) BY MOUTH DAILY. 90 tablet 1   Blood Glucose Monitoring Suppl (FREESTYLE LITE) DEVI Dispense 1 glucose monitoring device.  Use as directed by MD 1 each 0   Boswellia Serrata (BOSWELLIA PO) Take 1 tablet by mouth 2 (two) times daily.     budesonide (ENTOCORT EC) 3 MG 24 hr capsule TAKE 3 CAPSULES BY MOUTH DAILY 90 capsule 1   Cholecalciferol (VITAMIN D3 PO) Take by mouth. 2500 iu daily     dapagliflozin propanediol (FARXIGA) 10 MG TABS tablet TAKE 1 TABLET BY MOUTH ONCE DAILY 90 tablet 3   diclofenac sodium (VOLTAREN) 1 % GEL Apply 2 g topically 4 (four) times daily. Apply to hand joints as needed. Max 16 gm per joint and 32 grams total daily 100 g 2   Evolocumab 140 MG/ML SOAJ INJECT 140 MG INTO THE SKIN EVERY 14 DAYS 2 mL 6   glucose blood (FREESTYLE LITE) test strip Use to test glucose 1-2x daily 100 each 12   Insulin Glargine (BASAGLAR KWIKPEN) 100 UNIT/ML INJECT 0.3 MLS (30 UNITS TOTAL) INTO THE SKIN DAILY. 27 mL 3   Multiple Vitamin (MULTIVITAMIN) tablet Take 1 tablet by mouth daily.     Omega-3 Fatty Acids (FISH OIL PO) Take 4 g by mouth daily.     pioglitazone (ACTOS) 45 MG tablet TAKE 1 TABLET BY MOUTH ONCE DAILY 90 tablet 1   Probiotic Product (VSL#3 PO) Take by mouth as needed.     ustekinumab (STELARA) 90 MG/ML SOSY injection INJECT 1 ML (90 MG TOTAL) INTO THE SKIN EVERY 8 (EIGHT) WEEKS. 1 mL 1   valsartan-hydrochlorothiazide (DIOVAN-HCT) 320-25 MG tablet TAKE 1 TABLET BY MOUTH DAILY. 90 tablet 1   venlafaxine XR (EFFEXOR-XR) 37.5 MG 24 hr capsule TAKE 1 CAPSULE (37.5 MG TOTAL) BY MOUTH DAILY WITH BREAKFAST. 90 capsule 1   No current  facility-administered medications on file prior to visit.    Review of Systems:  As per HPI- otherwise negative.   Physical Examination: Vitals:   05/23/21 0941  BP: 122/72  Pulse: 84  Resp: 16  SpO2: 98%   Vitals:   05/23/21 0941  Weight:  186 lb (84.4 kg)  Height: 5' 11"  (1.803 m)   Body mass index is 25.94 kg/m. Ideal Body Weight: Weight in (lb) to have BMI = 25: 178.9  GEN: no acute distress.  Normal weight, looks well  HEENT: Atraumatic, Normocephalic.      Ears and Nose: No external deformity. CV: RRR, No M/G/R. No JVD. No thrill. No extra heart sounds. PULM: CTA B, no wheezes, crackles, rhonchi. No retractions. No resp. distress. No accessory muscle use. ABD: S, NT, ND, +BS. No rebound. No HSM. EXTR: No c/c/e PSYCH: Normally interactive. Conversant.  Foot exam- missed a couple filament locations left   Assessment and Plan: Physical exam  Controlled type 2 diabetes mellitus with complication, with long-term current use of insulin ([place]) - Plan: Comprehensive metabolic panel, Hemoglobin A1c, Microalbumin / creatinine urine ratio  Mixed hyperlipidemia - Plan: Lipid panel  Essential hypertension - Plan: CBC  Screening for prostate cancer - Plan: PSA  Crohn's disease of both small and large intestine with other complication (HCC)  History of skin cancer  PND (post-nasal drip) - Plan: ipratropium (ATROVENT) 0.03 % nasal spray  Leg cramps - Plan: Magnesium  Immunization due - Plan: Pneumococcal conjugate vaccine 20-valent (Prevnar 20)  Here today for physical exam.  Encouraged healthy diet and exercise routine Will plan further follow- up pending labs. Gave pneumovax Refilled atrovent nasal This visit occurred during the SARS-CoV-2 public health emergency.  Safety protocols were in place, including screening questions prior to the visit, additional usage of staff PPE, and extensive cleaning of exam room while observing appropriate contact time as indicated  for disinfecting solutions.   Signed Lamar Blinks, MD A1c is stable to slightly high  Lipids are better  Results for orders placed or performed in visit on 05/23/21  CBC  Result Value Ref Range   WBC 6.3 4.0 - 10.5 K/uL   RBC 5.13 4.22 - 5.81 Mil/uL   Platelets 326.0 150.0 - 400.0 K/uL   Hemoglobin 16.1 13.0 - 17.0 g/dL   HCT 46.4 39.0 - 52.0 %   MCV 90.4 78.0 - 100.0 fl   MCHC 34.6 30.0 - 36.0 g/dL   RDW 14.9 11.5 - 15.5 %  Comprehensive metabolic panel  Result Value Ref Range   Sodium 143 135 - 145 mEq/L   Potassium 4.3 3.5 - 5.1 mEq/L   Chloride 103 96 - 112 mEq/L   CO2 31 19 - 32 mEq/L   Glucose, Bld 111 (H) 70 - 99 mg/dL   BUN 27 (H) 6 - 23 mg/dL   Creatinine, Ser 1.16 0.40 - 1.50 mg/dL   Total Bilirubin 0.6 0.2 - 1.2 mg/dL   Alkaline Phosphatase 40 39 - 117 U/L   AST 16 0 - 37 U/L   ALT 12 0 - 53 U/L   Total Protein 6.8 6.0 - 8.3 g/dL   Albumin 4.2 3.5 - 5.2 g/dL   GFR 67.63 >60.00 mL/min   Calcium 9.2 8.4 - 10.5 mg/dL  Hemoglobin A1c  Result Value Ref Range   Hgb A1c MFr Bld 7.4 (H) 4.6 - 6.5 %  Lipid panel  Result Value Ref Range   Cholesterol 189 0 - 200 mg/dL   Triglycerides 171.0 (H) 0.0 - 149.0 mg/dL   HDL 45.60 >39.00 mg/dL   VLDL 34.2 0.0 - 40.0 mg/dL   LDL Cholesterol 109 (H) 0 - 99 mg/dL   Total CHOL/HDL Ratio 4    NonHDL 143.32   PSA  Result Value Ref Range  PSA 1.06 0.10 - 4.00 ng/mL  Microalbumin / creatinine urine ratio  Result Value Ref Range   Microalb, Ur 5.0 (H) 0.0 - 1.9 mg/dL   Creatinine,U 164.3 mg/dL   Microalb Creat Ratio 3.1 0.0 - 30.0 mg/g  Magnesium  Result Value Ref Range   Magnesium 2.1 1.5 - 2.5 mg/dL

## 2021-05-19 NOTE — Patient Instructions (Signed)
It was great to see you again today, as always!  I will be in touch with your labs soon as possible Go ahead and stop metformin We will check on your lipids today- perhaps we will space out your repatha shots a bit more (every 3-4 weeks?)   You might try zyrtec again for your allergy symptoms  Assuming all is well, we can plan to visit in about 6 months  Health Maintenance, Male Adopting a healthy lifestyle and getting preventive care are important in promoting health and wellness. Ask your health care provider about: The right schedule for you to have regular tests and exams. Things you can do on your own to prevent diseases and keep yourself healthy. What should I know about diet, weight, and exercise? Eat a healthy diet Eat a diet that includes plenty of vegetables, fruits, low-fat dairy products, and lean protein. Do not eat a lot of foods that are high in solid fats, added sugars, or sodium.   Maintain a healthy weight Body mass index (BMI) is a measurement that can be used to identify possible weight problems. It estimates body fat based on height and weight. Your health care provider can help determine your BMI and help you achieve or maintain a healthy weight. Get regular exercise Get regular exercise. This is one of the most important things you can do for your health. Most adults should: Exercise for at least 150 minutes each week. The exercise should increase your heart rate and make you sweat (moderate-intensity exercise). Do strengthening exercises at least twice a week. This is in addition to the moderate-intensity exercise. Spend less time sitting. Even light physical activity can be beneficial. Watch cholesterol and blood lipids Have your blood tested for lipids and cholesterol at 62 years of age, then have this test every 5 years. You may need to have your cholesterol levels checked more often if: Your lipid or cholesterol levels are high. You are older than 62 years of  age. You are at high risk for heart disease. What should I know about cancer screening? Many types of cancers can be detected early and may often be prevented. Depending on your health history and family history, you may need to have cancer screening at various ages. This may include screening for: Colorectal cancer. Prostate cancer. Skin cancer. Lung cancer. What should I know about heart disease, diabetes, and high blood pressure? Blood pressure and heart disease High blood pressure causes heart disease and increases the risk of stroke. This is more likely to develop in people who have high blood pressure readings, are of African descent, or are overweight. Talk with your health care provider about your target blood pressure readings. Have your blood pressure checked: Every 3-5 years if you are 67-43 years of age. Every year if you are 68 years old or older. If you are between the ages of 82 and 26 and are a current or former smoker, ask your health care provider if you should have a one-time screening for abdominal aortic aneurysm (AAA). Diabetes Have regular diabetes screenings. This checks your fasting blood sugar level. Have the screening done: Once every three years after age 69 if you are at a normal weight and have a low risk for diabetes. More often and at a younger age if you are overweight or have a high risk for diabetes. What should I know about preventing infection? Hepatitis B If you have a higher risk for hepatitis B, you should be screened for this  virus. Talk with your health care provider to find out if you are at risk for hepatitis B infection. Hepatitis C Blood testing is recommended for: Everyone born from [birth year] through 1965. Anyone with known risk factors for hepatitis C. Sexually transmitted infections (STIs) You should be screened each year for STIs, including gonorrhea and chlamydia, if: You are sexually active and are younger than 62 years of age. You are  older than 62 years of age and your health care provider tells you that you are at risk for this type of infection. Your sexual activity has changed since you were last screened, and you are at increased risk for chlamydia or gonorrhea. Ask your health care provider if you are at risk. Ask your health care provider about whether you are at high risk for HIV. Your health care provider may recommend a prescription medicine to help prevent HIV infection. If you choose to take medicine to prevent HIV, you should first get tested for HIV. You should then be tested every 3 months for as long as you are taking the medicine. Follow these instructions at home: Lifestyle Do not use any products that contain nicotine or tobacco, such as cigarettes, e-cigarettes, and chewing tobacco. If you need help quitting, ask your health care provider. Do not use street drugs. Do not share needles. Ask your health care provider for help if you need support or information about quitting drugs. Alcohol use Do not drink alcohol if your health care provider tells you not to drink. If you drink alcohol: Limit how much you have to 0-2 drinks a day. Be aware of how much alcohol is in your drink. In the [place] one drink equals one 12 oz bottle of beer (355 mL), one 5 oz glass of wine (148 mL), or one 1 oz glass of hard liquor (44 mL). General instructions Schedule regular health, dental, and eye exams. Stay current with your vaccines. Tell your health care provider if: You often feel depressed. You have ever been abused or do not feel safe at home. Summary Adopting a healthy lifestyle and getting preventive care are important in promoting health and wellness. Follow your health care provider's instructions about healthy diet, exercising, and getting tested or screened for diseases. Follow your health care provider's instructions on monitoring your cholesterol and blood pressure. This information is not intended to replace  advice given to you by your health care provider. Make sure you discuss any questions you have with your health care provider. Document Revised: 11/20/2018 Document Reviewed: 11/20/2018 Elsevier Patient Education  2021 [employer].

## 2021-05-23 ENCOUNTER — Ambulatory Visit (INDEPENDENT_AMBULATORY_CARE_PROVIDER_SITE_OTHER): Payer: 59 | Admitting: Family Medicine

## 2021-05-23 ENCOUNTER — Other Ambulatory Visit (HOSPITAL_BASED_OUTPATIENT_CLINIC_OR_DEPARTMENT_OTHER): Payer: Self-pay

## 2021-05-23 ENCOUNTER — Encounter: Payer: Self-pay | Admitting: Family Medicine

## 2021-05-23 ENCOUNTER — Other Ambulatory Visit: Payer: Self-pay

## 2021-05-23 VITALS — BP 122/72 | HR 84 | Resp 16 | Ht 71.0 in | Wt 186.0 lb

## 2021-05-23 DIAGNOSIS — R252 Cramp and spasm: Secondary | ICD-10-CM | POA: Diagnosis not present

## 2021-05-23 DIAGNOSIS — R0982 Postnasal drip: Secondary | ICD-10-CM | POA: Diagnosis not present

## 2021-05-23 DIAGNOSIS — Z794 Long term (current) use of insulin: Secondary | ICD-10-CM | POA: Diagnosis not present

## 2021-05-23 DIAGNOSIS — I10 Essential (primary) hypertension: Secondary | ICD-10-CM

## 2021-05-23 DIAGNOSIS — Z125 Encounter for screening for malignant neoplasm of prostate: Secondary | ICD-10-CM | POA: Diagnosis not present

## 2021-05-23 DIAGNOSIS — K50818 Crohn's disease of both small and large intestine with other complication: Secondary | ICD-10-CM | POA: Diagnosis not present

## 2021-05-23 DIAGNOSIS — Z85828 Personal history of other malignant neoplasm of skin: Secondary | ICD-10-CM

## 2021-05-23 DIAGNOSIS — Z0000 Encounter for general adult medical examination without abnormal findings: Secondary | ICD-10-CM

## 2021-05-23 DIAGNOSIS — E782 Mixed hyperlipidemia: Secondary | ICD-10-CM | POA: Diagnosis not present

## 2021-05-23 DIAGNOSIS — E118 Type 2 diabetes mellitus with unspecified complications: Secondary | ICD-10-CM

## 2021-05-23 DIAGNOSIS — Z23 Encounter for immunization: Secondary | ICD-10-CM | POA: Diagnosis not present

## 2021-05-23 LAB — COMPREHENSIVE METABOLIC PANEL
ALT: 12 U/L (ref 0–53)
AST: 16 U/L (ref 0–37)
Albumin: 4.2 g/dL (ref 3.5–5.2)
Alkaline Phosphatase: 40 U/L (ref 39–117)
BUN: 27 mg/dL — ABNORMAL HIGH (ref 6–23)
CO2: 31 mEq/L (ref 19–32)
Calcium: 9.2 mg/dL (ref 8.4–10.5)
Chloride: 103 mEq/L (ref 96–112)
Creatinine, Ser: 1.16 mg/dL (ref 0.40–1.50)
GFR: 67.63 mL/min (ref >60.00)
Glucose, Bld: 111 mg/dL — ABNORMAL HIGH (ref 70–99)
Potassium: 4.3 mEq/L (ref 3.5–5.1)
Sodium: 143 mEq/L (ref 135–145)
Total Bilirubin: 0.6 mg/dL (ref 0.2–1.2)
Total Protein: 6.8 g/dL (ref 6.0–8.3)

## 2021-05-23 LAB — CBC
HCT: 46.4 % (ref 39.0–52.0)
Hemoglobin: 16.1 g/dL (ref 13.0–17.0)
MCHC: 34.6 g/dL (ref 30.0–36.0)
MCV: 90.4 fl (ref 78.0–100.0)
Platelets: 326 10*3/uL (ref 150.0–400.0)
RBC: 5.13 Mil/uL (ref 4.22–5.81)
RDW: 14.9 % (ref 11.5–15.5)
WBC: 6.3 10*3/uL (ref 4.0–10.5)

## 2021-05-23 LAB — LIPID PANEL
Cholesterol: 189 mg/dL (ref 0–200)
HDL: 45.6 mg/dL (ref >39.00)
LDL Cholesterol: 109 mg/dL — ABNORMAL HIGH (ref 0–99)
NonHDL: 143.32
Total CHOL/HDL Ratio: 4
Triglycerides: 171 mg/dL — ABNORMAL HIGH (ref 0.0–149.0)
VLDL: 34.2 mg/dL (ref 0.0–40.0)

## 2021-05-23 LAB — PSA: PSA: 1.06 ng/mL (ref 0.10–4.00)

## 2021-05-23 LAB — HEMOGLOBIN A1C: Hgb A1c MFr Bld: 7.4 % — ABNORMAL HIGH (ref 4.6–6.5)

## 2021-05-23 LAB — MICROALBUMIN / CREATININE URINE RATIO
Creatinine,U: 164.3 mg/dL
Microalb Creat Ratio: 3.1 mg/g (ref 0.0–30.0)
Microalb, Ur: 5 mg/dL — ABNORMAL HIGH (ref 0.0–1.9)

## 2021-05-23 LAB — MAGNESIUM: Magnesium: 2.1 mg/dL (ref 1.5–2.5)

## 2021-05-23 MED ORDER — IPRATROPIUM BROMIDE 0.03 % NA SOLN
2.0000 | Freq: Three times a day (TID) | NASAL | 12 refills | Status: DC
Start: 2021-05-23 — End: 2022-06-28
  Filled 2021-05-23: qty 30, 28d supply, fill #0

## 2021-05-30 MED FILL — Amlodipine Besylate Tab 10 MG (Base Equivalent): ORAL | 90 days supply | Qty: 90 | Fill #0 | Status: AC

## 2021-05-30 MED FILL — Valsartan-Hydrochlorothiazide Tab 320-25 MG: ORAL | 90 days supply | Qty: 90 | Fill #0 | Status: AC

## 2021-05-31 ENCOUNTER — Other Ambulatory Visit (HOSPITAL_BASED_OUTPATIENT_CLINIC_OR_DEPARTMENT_OTHER): Payer: Self-pay

## 2021-05-31 MED FILL — Evolocumab Subcutaneous Soln Auto-Injector 140 MG/ML: SUBCUTANEOUS | 28 days supply | Qty: 2 | Fill #1 | Status: AC

## 2021-06-06 ENCOUNTER — Other Ambulatory Visit (HOSPITAL_COMMUNITY): Payer: Self-pay

## 2021-06-07 ENCOUNTER — Other Ambulatory Visit: Payer: Self-pay

## 2021-06-07 ENCOUNTER — Ambulatory Visit (INDEPENDENT_AMBULATORY_CARE_PROVIDER_SITE_OTHER): Payer: 59

## 2021-06-07 VITALS — BP 144/83 | HR 74 | Temp 98.0°F | Resp 17

## 2021-06-07 DIAGNOSIS — K50818 Crohn's disease of both small and large intestine with other complication: Secondary | ICD-10-CM | POA: Diagnosis not present

## 2021-06-07 MED ORDER — ALBUTEROL SULFATE HFA 108 (90 BASE) MCG/ACT IN AERS: 2.0000 | INHALATION_SPRAY | Freq: Once | RESPIRATORY_TRACT | Status: DC | PRN

## 2021-06-07 MED ORDER — VEDOLIZUMAB 300 MG IV SOLR
300.0000 mg | Freq: Once | INTRAVENOUS | Status: AC
  Administered 2021-06-07: 300 mg via INTRAVENOUS
  Filled 2021-06-07: qty 5

## 2021-06-07 MED ORDER — EPINEPHRINE 0.3 MG/0.3ML IJ SOAJ: 0.3000 mg | Freq: Once | INTRAMUSCULAR | Status: DC | PRN

## 2021-06-07 MED ORDER — SODIUM CHLORIDE 0.9 % IV SOLN: Freq: Once | INTRAVENOUS | Status: DC | PRN

## 2021-06-07 MED ORDER — FAMOTIDINE IN NACL 20-0.9 MG/50ML-% IV SOLN: 20.0000 mg | Freq: Once | INTRAVENOUS | Status: DC | PRN

## 2021-06-07 MED ORDER — METHYLPREDNISOLONE SODIUM SUCC 125 MG IJ SOLR: 125.0000 mg | Freq: Once | INTRAMUSCULAR | Status: DC | PRN

## 2021-06-07 MED ORDER — DIPHENHYDRAMINE HCL 50 MG/ML IJ SOLN: 50.0000 mg | Freq: Once | INTRAMUSCULAR | Status: DC | PRN

## 2021-06-07 NOTE — Progress Notes (Signed)
Diagnosis: Crohn's Disease  Provider:  Marshell Garfinkel, MD  Procedure: Infusion  IV Type: Peripheral, IV Location: L Forearm  Entyvio (Vedolizumab), Dose: 300 mg  Infusion Start Time: 0263  Infusion Stop Time: 7858  Post Infusion IV Care: Observation period completed and Peripheral IV Discontinued  Discharge: Condition: Good, Destination: Home . AVS provided to patient.   Performed by:  Janine Ores, RN

## 2021-06-08 ENCOUNTER — Other Ambulatory Visit: Payer: Self-pay

## 2021-06-08 ENCOUNTER — Other Ambulatory Visit: Payer: Self-pay | Admitting: Internal Medicine

## 2021-06-08 ENCOUNTER — Other Ambulatory Visit (HOSPITAL_BASED_OUTPATIENT_CLINIC_OR_DEPARTMENT_OTHER): Payer: Self-pay

## 2021-06-08 MED ORDER — BUDESONIDE 3 MG PO CPEP
3.0000 mg | ORAL_CAPSULE | Freq: Every day | ORAL | 1 refills | Status: DC
  Filled 2021-06-08: qty 90, 90d supply, fill #0
  Filled 2021-09-07: qty 90, 90d supply, fill #1

## 2021-06-08 MED ORDER — BUDESONIDE 3 MG PO CPEP
ORAL_CAPSULE | Freq: Every day | ORAL | 1 refills | Status: DC
  Filled 2021-06-08: qty 90, fill #0

## 2021-06-08 NOTE — Telephone Encounter (Signed)
Pharmacy requested budesonide refill and according to last office note he was to continue, so refill sent in.

## 2021-06-15 ENCOUNTER — Other Ambulatory Visit: Payer: Self-pay | Admitting: Family Medicine

## 2021-06-15 MED FILL — Insulin Glargine Soln Pen-Injector 100 Unit/ML: SUBCUTANEOUS | 90 days supply | Qty: 27 | Fill #0 | Status: AC

## 2021-06-16 ENCOUNTER — Other Ambulatory Visit (HOSPITAL_BASED_OUTPATIENT_CLINIC_OR_DEPARTMENT_OTHER): Payer: Self-pay

## 2021-06-16 ENCOUNTER — Encounter: Payer: Self-pay | Admitting: Internal Medicine

## 2021-06-16 MED ORDER — TECHLITE PEN NEEDLES 32G X 6 MM MISC
1 refills | Status: DC
  Filled 2021-06-16: qty 100, 90d supply, fill #0
  Filled 2021-09-21: qty 100, 90d supply, fill #1

## 2021-06-17 ENCOUNTER — Other Ambulatory Visit (HOSPITAL_BASED_OUTPATIENT_CLINIC_OR_DEPARTMENT_OTHER): Payer: Self-pay

## 2021-06-17 ENCOUNTER — Other Ambulatory Visit: Payer: Self-pay | Admitting: Family Medicine

## 2021-06-17 ENCOUNTER — Other Ambulatory Visit (HOSPITAL_COMMUNITY): Payer: Self-pay

## 2021-06-17 ENCOUNTER — Encounter: Payer: Self-pay | Admitting: Internal Medicine

## 2021-06-17 DIAGNOSIS — Z794 Long term (current) use of insulin: Secondary | ICD-10-CM

## 2021-06-17 DIAGNOSIS — E118 Type 2 diabetes mellitus with unspecified complications: Secondary | ICD-10-CM

## 2021-06-17 MED ORDER — BASAGLAR KWIKPEN 100 UNIT/ML ~~LOC~~ SOPN
PEN_INJECTOR | SUBCUTANEOUS | 3 refills | Status: DC
  Filled 2021-06-17: qty 27, fill #0
  Filled 2021-09-21: qty 27, 90d supply, fill #0
  Filled 2021-12-23: qty 27, 90d supply, fill #1
  Filled 2022-03-26: qty 27, 90d supply, fill #2

## 2021-06-20 ENCOUNTER — Encounter: Payer: Self-pay | Admitting: Family Medicine

## 2021-06-20 ENCOUNTER — Telehealth: Payer: Self-pay | Admitting: *Deleted

## 2021-06-20 NOTE — Telephone Encounter (Signed)
[employer] faxed over stating Thomas Peterson is a non-preferred requiring member to pay a higher copay.  To help lower the members copay we request you convert this patient to one of the preferred medications below.  Semglee (YFGN) pen [name] flextouch U-100 Levemir flextouch

## 2021-06-21 ENCOUNTER — Ambulatory Visit (INDEPENDENT_AMBULATORY_CARE_PROVIDER_SITE_OTHER): Payer: 59

## 2021-06-21 VITALS — BP 134/83 | HR 79 | Temp 98.0°F | Resp 16

## 2021-06-21 DIAGNOSIS — K50818 Crohn's disease of both small and large intestine with other complication: Secondary | ICD-10-CM

## 2021-06-21 MED ORDER — HEPARIN SOD (PORK) LOCK FLUSH 100 UNIT/ML IV SOLN: 500.0000 [IU] | Freq: Once | INTRAVENOUS | Status: DC | PRN

## 2021-06-21 MED ORDER — ALBUTEROL SULFATE HFA 108 (90 BASE) MCG/ACT IN AERS: 2.0000 | INHALATION_SPRAY | Freq: Once | RESPIRATORY_TRACT | Status: DC | PRN

## 2021-06-21 MED ORDER — ANTICOAGULANT SODIUM CITRATE 4% (200MG/5ML) IV SOLN
5.0000 mL | Freq: Once | Status: DC | PRN
  Filled 2021-06-21: qty 5

## 2021-06-21 MED ORDER — ALTEPLASE 2 MG IJ SOLR: 2.0000 mg | Freq: Once | INTRAMUSCULAR | Status: DC | PRN

## 2021-06-21 MED ORDER — HEPARIN SOD (PORK) LOCK FLUSH 100 UNIT/ML IV SOLN: 250.0000 [IU] | Freq: Once | INTRAVENOUS | Status: DC | PRN

## 2021-06-21 MED ORDER — DIPHENHYDRAMINE HCL 50 MG/ML IJ SOLN: 50.0000 mg | Freq: Once | INTRAMUSCULAR | Status: DC | PRN

## 2021-06-21 MED ORDER — SODIUM CHLORIDE 0.9% FLUSH: 3.0000 mL | Freq: Once | INTRAVENOUS | Status: DC | PRN

## 2021-06-21 MED ORDER — FAMOTIDINE IN NACL 20-0.9 MG/50ML-% IV SOLN: 20.0000 mg | Freq: Once | INTRAVENOUS | Status: DC | PRN

## 2021-06-21 MED ORDER — METHYLPREDNISOLONE SODIUM SUCC 125 MG IJ SOLR: 125.0000 mg | Freq: Once | INTRAMUSCULAR | Status: DC | PRN

## 2021-06-21 MED ORDER — SODIUM CHLORIDE 0.9 % IV SOLN: Freq: Once | INTRAVENOUS | Status: DC | PRN

## 2021-06-21 MED ORDER — SODIUM CHLORIDE 0.9% FLUSH: 10.0000 mL | Freq: Once | INTRAVENOUS | Status: DC | PRN

## 2021-06-21 MED ORDER — VEDOLIZUMAB 300 MG IV SOLR
300.0000 mg | Freq: Once | INTRAVENOUS | Status: AC
  Administered 2021-06-21: 300 mg via INTRAVENOUS
  Filled 2021-06-21: qty 5

## 2021-06-21 MED ORDER — EPINEPHRINE 0.3 MG/0.3ML IJ SOAJ: 0.3000 mg | Freq: Once | INTRAMUSCULAR | Status: DC | PRN

## 2021-06-21 NOTE — Progress Notes (Signed)
Diagnosis: Crohn's Disease  Provider:  Marshell Garfinkel, MD  Procedure: Infusion  IV Type: Peripheral, IV Location: L Antecubital  Entyvio (Vedolizumab), Dose: 300 mg  Infusion Start Time: 0930  Infusion Stop Time: 1030  Post Infusion IV Care: Peripheral IV Discontinued  Discharge: Condition: Good, Destination: Home . AVS provided to patient.   Performed by:  Koren Shiver, RN

## 2021-07-05 ENCOUNTER — Other Ambulatory Visit (HOSPITAL_COMMUNITY): Payer: Self-pay

## 2021-07-12 ENCOUNTER — Other Ambulatory Visit (HOSPITAL_BASED_OUTPATIENT_CLINIC_OR_DEPARTMENT_OTHER): Payer: Self-pay

## 2021-07-19 DIAGNOSIS — Z85820 Personal history of malignant melanoma of skin: Secondary | ICD-10-CM | POA: Diagnosis not present

## 2021-07-19 DIAGNOSIS — D225 Melanocytic nevi of trunk: Secondary | ICD-10-CM | POA: Diagnosis not present

## 2021-07-19 DIAGNOSIS — L821 Other seborrheic keratosis: Secondary | ICD-10-CM | POA: Diagnosis not present

## 2021-07-19 DIAGNOSIS — L570 Actinic keratosis: Secondary | ICD-10-CM | POA: Diagnosis not present

## 2021-07-19 DIAGNOSIS — D1801 Hemangioma of skin and subcutaneous tissue: Secondary | ICD-10-CM | POA: Diagnosis not present

## 2021-07-19 DIAGNOSIS — Z85828 Personal history of other malignant neoplasm of skin: Secondary | ICD-10-CM | POA: Diagnosis not present

## 2021-07-19 DIAGNOSIS — L738 Other specified follicular disorders: Secondary | ICD-10-CM | POA: Diagnosis not present

## 2021-07-19 DIAGNOSIS — L814 Other melanin hyperpigmentation: Secondary | ICD-10-CM | POA: Diagnosis not present

## 2021-07-21 ENCOUNTER — Ambulatory Visit (INDEPENDENT_AMBULATORY_CARE_PROVIDER_SITE_OTHER): Payer: 59

## 2021-07-21 ENCOUNTER — Other Ambulatory Visit: Payer: Self-pay

## 2021-07-21 VITALS — BP 126/76 | HR 73 | Temp 98.1°F | Resp 16

## 2021-07-21 DIAGNOSIS — K50818 Crohn's disease of both small and large intestine with other complication: Secondary | ICD-10-CM

## 2021-07-21 MED ORDER — METHYLPREDNISOLONE SODIUM SUCC 125 MG IJ SOLR: 125.0000 mg | Freq: Once | INTRAMUSCULAR | Status: DC | PRN

## 2021-07-21 MED ORDER — DIPHENHYDRAMINE HCL 50 MG/ML IJ SOLN: 50.0000 mg | Freq: Once | INTRAMUSCULAR | Status: DC | PRN

## 2021-07-21 MED ORDER — SODIUM CHLORIDE 0.9% FLUSH: 10.0000 mL | Freq: Once | INTRAVENOUS | Status: DC | PRN

## 2021-07-21 MED ORDER — HEPARIN SOD (PORK) LOCK FLUSH 100 UNIT/ML IV SOLN: 500.0000 [IU] | Freq: Once | INTRAVENOUS | Status: DC | PRN

## 2021-07-21 MED ORDER — SODIUM CHLORIDE 0.9 % IV SOLN: Freq: Once | INTRAVENOUS | Status: DC | PRN

## 2021-07-21 MED ORDER — SODIUM CHLORIDE 0.9% FLUSH: 3.0000 mL | Freq: Once | INTRAVENOUS | Status: DC | PRN

## 2021-07-21 MED ORDER — ANTICOAGULANT SODIUM CITRATE 4% (200MG/5ML) IV SOLN: 5.0000 mL | Freq: Once | Status: DC | PRN

## 2021-07-21 MED ORDER — VEDOLIZUMAB 300 MG IV SOLR
300.0000 mg | Freq: Once | INTRAVENOUS | Status: AC
  Administered 2021-07-21: 300 mg via INTRAVENOUS
  Filled 2021-07-21: qty 5

## 2021-07-21 MED ORDER — FAMOTIDINE IN NACL 20-0.9 MG/50ML-% IV SOLN: 20.0000 mg | Freq: Once | INTRAVENOUS | Status: DC | PRN

## 2021-07-21 MED ORDER — HEPARIN SOD (PORK) LOCK FLUSH 100 UNIT/ML IV SOLN: 250.0000 [IU] | Freq: Once | INTRAVENOUS | Status: DC | PRN

## 2021-07-21 MED ORDER — ALBUTEROL SULFATE HFA 108 (90 BASE) MCG/ACT IN AERS: 2.0000 | INHALATION_SPRAY | Freq: Once | RESPIRATORY_TRACT | Status: DC | PRN

## 2021-07-21 MED ORDER — EPINEPHRINE 0.3 MG/0.3ML IJ SOAJ: 0.3000 mg | Freq: Once | INTRAMUSCULAR | Status: DC | PRN

## 2021-07-21 MED ORDER — ALTEPLASE 2 MG IJ SOLR: 2.0000 mg | Freq: Once | INTRAMUSCULAR | Status: DC | PRN

## 2021-07-21 NOTE — Progress Notes (Signed)
Diagnosis: Crohn's Disease  Provider:  Marshell Garfinkel, MD  Procedure: Infusion  IV Type: Peripheral, IV Location: L Forearm  Entyvio (Vedolizumab), Dose: 300 mg  Infusion Start Time: 0910  Infusion Stop Time: 0950  Post Infusion IV Care: Observation period completed  Discharge: Condition: Good, Destination: Home . AVS provided to patient.   Performed by:  Paul Dykes, RN

## 2021-07-22 ENCOUNTER — Other Ambulatory Visit (HOSPITAL_BASED_OUTPATIENT_CLINIC_OR_DEPARTMENT_OTHER): Payer: Self-pay

## 2021-07-22 ENCOUNTER — Other Ambulatory Visit: Payer: Self-pay | Admitting: Family Medicine

## 2021-07-22 DIAGNOSIS — Z794 Long term (current) use of insulin: Secondary | ICD-10-CM

## 2021-07-22 DIAGNOSIS — E118 Type 2 diabetes mellitus with unspecified complications: Secondary | ICD-10-CM

## 2021-07-22 MED ORDER — DAPAGLIFLOZIN PROPANEDIOL 10 MG PO TABS
ORAL_TABLET | Freq: Every day | ORAL | 3 refills | Status: DC
  Filled 2021-07-22: qty 90, 90d supply, fill #0
  Filled 2021-08-15 – 2021-11-19 (×2): qty 90, 90d supply, fill #1
  Filled 2022-02-08: qty 90, 90d supply, fill #2
  Filled 2022-06-18: qty 90, 90d supply, fill #3

## 2021-07-22 MED ORDER — PIOGLITAZONE HCL 45 MG PO TABS
ORAL_TABLET | Freq: Every day | ORAL | 1 refills | Status: DC
  Filled 2021-07-22: qty 90, 90d supply, fill #0
  Filled 2021-10-25: qty 90, 90d supply, fill #1

## 2021-07-22 MED FILL — Evolocumab Subcutaneous Soln Auto-Injector 140 MG/ML: SUBCUTANEOUS | 28 days supply | Qty: 2 | Fill #2 | Status: AC

## 2021-08-08 NOTE — Progress Notes (Signed)
Diagnosis: Crohn's Disease  Provider:  Marshell Garfinkel, MD  Procedure: Infusion  IV Type: Peripheral, IV Location: L Antecubital  Entyvio (Vedolizumab), Dose: 300 mg  Infusion Start Time: 0930  Infusion Stop Time: 1030  Post Infusion IV Care: Peripheral IV Discontinued  Discharge: Condition: Good, Destination: Home . AVS provided to patient.   Performed by:  Koren Shiver, RN

## 2021-08-16 ENCOUNTER — Other Ambulatory Visit (HOSPITAL_BASED_OUTPATIENT_CLINIC_OR_DEPARTMENT_OTHER): Payer: Self-pay

## 2021-08-17 ENCOUNTER — Other Ambulatory Visit: Payer: Self-pay | Admitting: Internal Medicine

## 2021-08-19 ENCOUNTER — Other Ambulatory Visit (HOSPITAL_BASED_OUTPATIENT_CLINIC_OR_DEPARTMENT_OTHER): Payer: Self-pay

## 2021-09-02 ENCOUNTER — Ambulatory Visit: Payer: 59 | Attending: Internal Medicine

## 2021-09-02 DIAGNOSIS — Z23 Encounter for immunization: Secondary | ICD-10-CM

## 2021-09-02 NOTE — Progress Notes (Signed)
   Covid-19 Vaccination Clinic  Name:  Jordi Lacko    MRN: [id number] DOB: January 04, 1961  09/02/2021  Mr. Knappenberger was observed post Covid-19 immunization for 15 minutes without incident. He was provided with Vaccine Information Sheet and instruction to access the V-Safe system.   Mr. Kicklighter was instructed to call 911 with any severe reactions post vaccine: Difficulty breathing  Swelling of face and throat  A fast heartbeat  A bad rash all over body  Dizziness and weakness

## 2021-09-07 ENCOUNTER — Other Ambulatory Visit: Payer: Self-pay | Admitting: Family Medicine

## 2021-09-07 ENCOUNTER — Other Ambulatory Visit (HOSPITAL_BASED_OUTPATIENT_CLINIC_OR_DEPARTMENT_OTHER): Payer: Self-pay

## 2021-09-07 DIAGNOSIS — I10 Essential (primary) hypertension: Secondary | ICD-10-CM

## 2021-09-07 MED ORDER — VALSARTAN-HYDROCHLOROTHIAZIDE 320-25 MG PO TABS
1.0000 | ORAL_TABLET | Freq: Every day | ORAL | 0 refills | Status: DC
  Filled 2021-09-07: qty 90, 90d supply, fill #0

## 2021-09-07 MED ORDER — VENLAFAXINE HCL ER 37.5 MG PO CP24
ORAL_CAPSULE | Freq: Every day | ORAL | 0 refills | Status: DC
  Filled 2021-09-07: qty 90, 90d supply, fill #0

## 2021-09-07 MED ORDER — AMLODIPINE BESYLATE 10 MG PO TABS
ORAL_TABLET | Freq: Every day | ORAL | 0 refills | Status: DC
  Filled 2021-09-07: qty 90, 90d supply, fill #0

## 2021-09-09 ENCOUNTER — Other Ambulatory Visit (HOSPITAL_BASED_OUTPATIENT_CLINIC_OR_DEPARTMENT_OTHER): Payer: Self-pay

## 2021-09-09 MED ORDER — COVID-19MRNA BIVAL VACC PFIZER 30 MCG/0.3ML IM SUSP
INTRAMUSCULAR | 0 refills | Status: DC
  Filled 2021-09-09: qty 0.3, 1d supply, fill #0

## 2021-09-15 ENCOUNTER — Other Ambulatory Visit: Payer: Self-pay

## 2021-09-15 ENCOUNTER — Ambulatory Visit (INDEPENDENT_AMBULATORY_CARE_PROVIDER_SITE_OTHER): Payer: 59 | Admitting: *Deleted

## 2021-09-15 VITALS — BP 132/78 | HR 74 | Temp 98.1°F | Resp 20 | Ht 71.0 in | Wt 188.4 lb

## 2021-09-15 DIAGNOSIS — K50818 Crohn's disease of both small and large intestine with other complication: Secondary | ICD-10-CM | POA: Diagnosis not present

## 2021-09-15 MED ORDER — ANTICOAGULANT SODIUM CITRATE 4% (200MG/5ML) IV SOLN
5.0000 mL | Freq: Once | Status: DC | PRN
  Filled 2021-09-15: qty 5

## 2021-09-15 MED ORDER — HEPARIN SOD (PORK) LOCK FLUSH 100 UNIT/ML IV SOLN
500.0000 [IU] | Freq: Once | INTRAVENOUS | Status: DC | PRN
Start: 2021-09-15 — End: 2021-09-15

## 2021-09-15 MED ORDER — ALBUTEROL SULFATE HFA 108 (90 BASE) MCG/ACT IN AERS: 2.0000 | INHALATION_SPRAY | Freq: Once | RESPIRATORY_TRACT | Status: DC | PRN

## 2021-09-15 MED ORDER — DIPHENHYDRAMINE HCL 50 MG/ML IJ SOLN: 50.0000 mg | Freq: Once | INTRAMUSCULAR | Status: DC | PRN

## 2021-09-15 MED ORDER — EPINEPHRINE 0.3 MG/0.3ML IJ SOAJ: 0.3000 mg | Freq: Once | INTRAMUSCULAR | Status: DC | PRN

## 2021-09-15 MED ORDER — HEPARIN SOD (PORK) LOCK FLUSH 100 UNIT/ML IV SOLN
250.0000 [IU] | Freq: Once | INTRAVENOUS | Status: DC | PRN
Start: 2021-09-15 — End: 2021-09-15

## 2021-09-15 MED ORDER — METHYLPREDNISOLONE SODIUM SUCC 125 MG IJ SOLR: 125.0000 mg | Freq: Once | INTRAMUSCULAR | Status: DC | PRN

## 2021-09-15 MED ORDER — SODIUM CHLORIDE 0.9% FLUSH: 10.0000 mL | Freq: Once | INTRAVENOUS | Status: DC | PRN

## 2021-09-15 MED ORDER — SODIUM CHLORIDE 0.9 % IV SOLN: Freq: Once | INTRAVENOUS | Status: DC | PRN

## 2021-09-15 MED ORDER — VEDOLIZUMAB 300 MG IV SOLR
300.0000 mg | Freq: Once | INTRAVENOUS | Status: AC
  Administered 2021-09-15: 300 mg via INTRAVENOUS
  Filled 2021-09-15: qty 5

## 2021-09-15 MED ORDER — FAMOTIDINE IN NACL 20-0.9 MG/50ML-% IV SOLN: 20.0000 mg | Freq: Once | INTRAVENOUS | Status: DC | PRN

## 2021-09-15 MED ORDER — ALTEPLASE 2 MG IJ SOLR: 2.0000 mg | Freq: Once | INTRAMUSCULAR | Status: DC | PRN

## 2021-09-15 MED ORDER — SODIUM CHLORIDE 0.9% FLUSH: 3.0000 mL | Freq: Once | INTRAVENOUS | Status: DC | PRN

## 2021-09-15 NOTE — Progress Notes (Signed)
Diagnosis: Crohn's Disease  Provider:  Marshell Garfinkel, MD  Procedure: Infusion  IV Type: Peripheral, IV Location: R Forearm  Entyvio (Vedolizumab), Dose: 300 mg  Infusion Start Time: 0915 am  Infusion Stop Time: 0946 am  Post Infusion IV Care: Observation period completed and Peripheral IV Discontinued  Discharge: Condition: Good, Destination: Home . AVS provided to patient.   Performed by:  Oren Beckmann, RN

## 2021-09-21 ENCOUNTER — Other Ambulatory Visit (HOSPITAL_BASED_OUTPATIENT_CLINIC_OR_DEPARTMENT_OTHER): Payer: Self-pay

## 2021-10-11 ENCOUNTER — Telehealth: Payer: Self-pay | Admitting: Pharmacy Technician

## 2021-10-11 NOTE — Telephone Encounter (Addendum)
Auth Submission: PENDING -  CONTINUATION OF SERVICES - EXTENDED PA -  ENTYVIO  Payer: [employer] Medication & CPT/J Code(s) submitted: Entyvio (Vedolizumab) [id number] Route of submission (phone, fax, portal): OTLXB:[phone] FAX: [phone] Auth type: Buy/Bill Units/visits requested:  (Q8WK DOSING) Reference number: [id number]   Will update once we receive a response.

## 2021-10-14 NOTE — Telephone Encounter (Signed)
Auth Submission: APPROVED  Payer: [employer] Medication & CPT/J Code(s) submitted: Entyvio (Vedolizumab) [id number] Route of submission (phone, fax, portal): FAX: [phone] PHONE: [phone] Auth type: Buy/Bill Units/visits requested: Q8WKS DOSING Reference number: [id number] Approval from: 11/03/21 to 11/03/22

## 2021-10-18 ENCOUNTER — Other Ambulatory Visit: Payer: Self-pay | Admitting: Internal Medicine

## 2021-10-18 DIAGNOSIS — Z7960 Long term (current) use of unspecified immunomodulators and immunosuppressants: Secondary | ICD-10-CM

## 2021-10-18 DIAGNOSIS — K50818 Crohn's disease of both small and large intestine with other complication: Secondary | ICD-10-CM

## 2021-10-25 ENCOUNTER — Other Ambulatory Visit (HOSPITAL_BASED_OUTPATIENT_CLINIC_OR_DEPARTMENT_OTHER): Payer: Self-pay

## 2021-10-27 ENCOUNTER — Other Ambulatory Visit: Payer: Self-pay | Admitting: Pharmacy Technician

## 2021-10-28 ENCOUNTER — Other Ambulatory Visit: Payer: Self-pay | Admitting: Internal Medicine

## 2021-10-28 ENCOUNTER — Encounter: Payer: Self-pay | Admitting: Internal Medicine

## 2021-10-31 ENCOUNTER — Other Ambulatory Visit (HOSPITAL_BASED_OUTPATIENT_CLINIC_OR_DEPARTMENT_OTHER): Payer: Self-pay

## 2021-10-31 ENCOUNTER — Other Ambulatory Visit: Payer: Self-pay | Admitting: Internal Medicine

## 2021-10-31 MED ORDER — CELECOXIB 200 MG PO CAPS
200.0000 mg | ORAL_CAPSULE | Freq: Two times a day (BID) | ORAL | 1 refills | Status: DC
  Filled 2021-10-31: qty 60, 30d supply, fill #0
  Filled 2021-12-15: qty 60, 30d supply, fill #1

## 2021-11-01 ENCOUNTER — Other Ambulatory Visit (HOSPITAL_BASED_OUTPATIENT_CLINIC_OR_DEPARTMENT_OTHER): Payer: Self-pay

## 2021-11-01 MED FILL — Evolocumab Subcutaneous Soln Auto-Injector 140 MG/ML: SUBCUTANEOUS | 28 days supply | Qty: 2 | Fill #3 | Status: AC

## 2021-11-11 ENCOUNTER — Ambulatory Visit (INDEPENDENT_AMBULATORY_CARE_PROVIDER_SITE_OTHER): Payer: 59

## 2021-11-11 ENCOUNTER — Other Ambulatory Visit: Payer: Self-pay

## 2021-11-11 ENCOUNTER — Other Ambulatory Visit: Payer: 59

## 2021-11-11 VITALS — BP 119/73 | HR 80 | Temp 98.2°F | Resp 18

## 2021-11-11 DIAGNOSIS — Z7960 Long term (current) use of unspecified immunomodulators and immunosuppressants: Secondary | ICD-10-CM | POA: Diagnosis not present

## 2021-11-11 DIAGNOSIS — K50818 Crohn's disease of both small and large intestine with other complication: Secondary | ICD-10-CM | POA: Diagnosis not present

## 2021-11-11 MED ORDER — SODIUM CHLORIDE 0.9 % IV SOLN: Freq: Once | INTRAVENOUS | Status: DC | PRN

## 2021-11-11 MED ORDER — VEDOLIZUMAB 300 MG IV SOLR
300.0000 mg | Freq: Once | INTRAVENOUS | Status: AC
  Administered 2021-11-11: 300 mg via INTRAVENOUS
  Filled 2021-11-11: qty 5

## 2021-11-11 MED ORDER — DIPHENHYDRAMINE HCL 50 MG/ML IJ SOLN: 50.0000 mg | Freq: Once | INTRAMUSCULAR | Status: DC | PRN

## 2021-11-11 MED ORDER — METHYLPREDNISOLONE SODIUM SUCC 125 MG IJ SOLR: 125.0000 mg | Freq: Once | INTRAMUSCULAR | Status: DC | PRN

## 2021-11-11 MED ORDER — FAMOTIDINE IN NACL 20-0.9 MG/50ML-% IV SOLN: 20.0000 mg | Freq: Once | INTRAVENOUS | Status: DC | PRN

## 2021-11-11 MED ORDER — ALBUTEROL SULFATE HFA 108 (90 BASE) MCG/ACT IN AERS: 2.0000 | INHALATION_SPRAY | Freq: Once | RESPIRATORY_TRACT | Status: DC | PRN

## 2021-11-11 MED ORDER — EPINEPHRINE 0.3 MG/0.3ML IJ SOAJ: 0.3000 mg | Freq: Once | INTRAMUSCULAR | Status: DC | PRN

## 2021-11-11 NOTE — Progress Notes (Signed)
Diagnosis: Crohn's Disease  Provider:  Marshell Garfinkel, MD  Procedure: Infusion  IV Type: Peripheral, IV Location: L Hand  Entyvio (Vedolizumab), Dose: 300 mg  Infusion Start Time: 9574  Infusion Stop Time: 7340  Post Infusion IV Care: Peripheral IV Discontinued  Discharge: Condition: Good, Destination: Home . AVS provided to patient.   Performed by:  Koren Shiver, RN

## 2021-11-13 LAB — QUANTIFERON-TB GOLD PLUS
Mitogen-NIL: >10 IU/mL
NIL: 0.03 IU/mL
QuantiFERON-TB Gold Plus: NEGATIVE
TB1-NIL: 0 IU/mL
TB2-NIL: 0.01 IU/mL

## 2021-11-20 ENCOUNTER — Other Ambulatory Visit: Payer: Self-pay | Admitting: Internal Medicine

## 2021-11-20 LAB — SERIAL MONITORING

## 2021-11-21 ENCOUNTER — Other Ambulatory Visit (HOSPITAL_BASED_OUTPATIENT_CLINIC_OR_DEPARTMENT_OTHER): Payer: Self-pay

## 2021-11-21 LAB — VEDOLIZUMAB AND ANTI-VEDO AB
Anti-Vedolizumab Antibody: <25 ng/mL
Vedolizumab: 23 ug/mL

## 2021-12-08 ENCOUNTER — Encounter: Payer: Self-pay | Admitting: Family Medicine

## 2021-12-08 DIAGNOSIS — E782 Mixed hyperlipidemia: Secondary | ICD-10-CM

## 2021-12-08 DIAGNOSIS — I10 Essential (primary) hypertension: Secondary | ICD-10-CM

## 2021-12-08 DIAGNOSIS — Z794 Long term (current) use of insulin: Secondary | ICD-10-CM

## 2021-12-15 ENCOUNTER — Other Ambulatory Visit (HOSPITAL_BASED_OUTPATIENT_CLINIC_OR_DEPARTMENT_OTHER): Payer: Self-pay

## 2021-12-15 ENCOUNTER — Other Ambulatory Visit: Payer: Self-pay | Admitting: Family Medicine

## 2021-12-15 ENCOUNTER — Other Ambulatory Visit (INDEPENDENT_AMBULATORY_CARE_PROVIDER_SITE_OTHER): Payer: 59

## 2021-12-15 ENCOUNTER — Telehealth: Payer: Self-pay

## 2021-12-15 DIAGNOSIS — I10 Essential (primary) hypertension: Secondary | ICD-10-CM

## 2021-12-15 DIAGNOSIS — E782 Mixed hyperlipidemia: Secondary | ICD-10-CM

## 2021-12-15 DIAGNOSIS — Z794 Long term (current) use of insulin: Secondary | ICD-10-CM

## 2021-12-15 DIAGNOSIS — E118 Type 2 diabetes mellitus with unspecified complications: Secondary | ICD-10-CM | POA: Diagnosis not present

## 2021-12-15 LAB — LDL CHOLESTEROL, DIRECT: Direct LDL: 110 mg/dL

## 2021-12-15 LAB — LIPID PANEL
Cholesterol: 229 mg/dL — ABNORMAL HIGH (ref 0–200)
HDL: 42.1 mg/dL (ref >39.00)
NonHDL: 186.82
Total CHOL/HDL Ratio: 5
Triglycerides: 283 mg/dL — ABNORMAL HIGH (ref 0.0–149.0)
VLDL: 56.6 mg/dL — ABNORMAL HIGH (ref 0.0–40.0)

## 2021-12-15 LAB — CBC
HCT: 48 % (ref 39.0–52.0)
Hemoglobin: 15.8 g/dL (ref 13.0–17.0)
MCHC: 33 g/dL (ref 30.0–36.0)
MCV: 91.8 fl (ref 78.0–100.0)
Platelets: 350 10*3/uL (ref 150.0–400.0)
RBC: 5.23 Mil/uL (ref 4.22–5.81)
RDW: 14.3 % (ref 11.5–15.5)
WBC: 6.1 10*3/uL (ref 4.0–10.5)

## 2021-12-15 LAB — COMPREHENSIVE METABOLIC PANEL
ALT: 16 U/L (ref 0–53)
AST: 21 U/L (ref 0–37)
Albumin: 4.2 g/dL (ref 3.5–5.2)
Alkaline Phosphatase: 41 U/L (ref 39–117)
BUN: 28 mg/dL — ABNORMAL HIGH (ref 6–23)
CO2: 35 mEq/L — ABNORMAL HIGH (ref 19–32)
Calcium: 9.2 mg/dL (ref 8.4–10.5)
Chloride: 100 mEq/L (ref 96–112)
Creatinine, Ser: 1.29 mg/dL (ref 0.40–1.50)
GFR: 59.3 mL/min — ABNORMAL LOW (ref >60.00)
Glucose, Bld: 140 mg/dL — ABNORMAL HIGH (ref 70–99)
Potassium: 4.2 mEq/L (ref 3.5–5.1)
Sodium: 142 mEq/L (ref 135–145)
Total Bilirubin: 0.6 mg/dL (ref 0.2–1.2)
Total Protein: 6.7 g/dL (ref 6.0–8.3)

## 2021-12-15 LAB — HEMOGLOBIN A1C: Hgb A1c MFr Bld: 7.3 % — ABNORMAL HIGH (ref 4.6–6.5)

## 2021-12-15 MED ORDER — AMLODIPINE BESYLATE 10 MG PO TABS
ORAL_TABLET | Freq: Every day | ORAL | 0 refills | Status: DC
  Filled 2021-12-15: qty 90, 90d supply, fill #0

## 2021-12-15 MED ORDER — REPATHA SURECLICK 140 MG/ML ~~LOC~~ SOAJ
SUBCUTANEOUS | 6 refills | Status: DC
  Filled 2021-12-15: qty 2, 28d supply, fill #0
  Filled 2022-02-08: qty 2, 28d supply, fill #1
  Filled 2022-04-12: qty 2, 28d supply, fill #2
  Filled 2022-05-25: qty 2, 28d supply, fill #3
  Filled 2022-06-18: qty 2, 28d supply, fill #4
  Filled 2022-08-20: qty 2, 28d supply, fill #5
  Filled 2022-10-12: qty 2, 28d supply, fill #6

## 2021-12-15 MED ORDER — VALSARTAN-HYDROCHLOROTHIAZIDE 320-25 MG PO TABS
1.0000 | ORAL_TABLET | Freq: Every day | ORAL | 0 refills | Status: DC
  Filled 2021-12-15: qty 90, 90d supply, fill #0

## 2021-12-15 MED ORDER — VENLAFAXINE HCL ER 37.5 MG PO CP24
ORAL_CAPSULE | Freq: Every day | ORAL | 0 refills | Status: DC
  Filled 2021-12-15: qty 90, 90d supply, fill #0

## 2021-12-15 NOTE — Telephone Encounter (Signed)
Message from Plan Member has an active PA on file which is expiring on 12/19/2021 and has 5 no. of fills remaining.

## 2021-12-16 ENCOUNTER — Encounter: Payer: Self-pay | Admitting: Family Medicine

## 2021-12-16 ENCOUNTER — Other Ambulatory Visit (HOSPITAL_BASED_OUTPATIENT_CLINIC_OR_DEPARTMENT_OTHER): Payer: Self-pay

## 2021-12-16 MED ORDER — CARESTART COVID-19 HOME TEST VI KIT
PACK | 0 refills | Status: DC
  Filled 2021-12-16: qty 4, 4d supply, fill #0

## 2021-12-23 ENCOUNTER — Other Ambulatory Visit (HOSPITAL_BASED_OUTPATIENT_CLINIC_OR_DEPARTMENT_OTHER): Payer: Self-pay

## 2021-12-23 NOTE — Progress Notes (Signed)
[place] [facility] [address] [place], [place] [address] [phone]  Date:  12/26/2021   Name:  Thomas Peterson   DOB:  1961/02/08   MRN:  [id number]  PCP:  Darreld Mclean, MD    Chief Complaint: Annual Exam (Pt says his CPE is not due until July- so this needs to be a F/u. /Concerns/ questions: 2. Increasing Arthritis. 3. Right IT band Issues. 4. Pinched nerve in back./Flu shot: received already/Eye exam: scheduled already)   History of Present Illness:  Thomas Peterson is a 61 y.o. very pleasant male patient who presents with the following:  Patient seen today for periodic follow-up Most recent visit with myself June 2022  He works as a Scientist, research (medical) with [employer]- they are getting ready for a new rad onc center near [place], he is assisting in the plan progress He is trying to cut down to working 4 days a week-admits his schedule is pretty tiring!  His family is well- his daughter is engaged, they have not yet set a date.  She and her fianc met in [place] where they are both living and working for now   History of hypertension, controlled diabetes, Crohn's disease, hyperlipidemia, melanoma His Crohn's disease does sometimes require steroids, which can make diabetes control more challenging He also does not tolerate statins  Eye exam- scheduled  Flu shot- done approx September 25 Other immunizations are up-to-date Most recent colonoscopy in March 2022 His Crohn's disease is managed by his gastroenterologist, Dr. Bunnie Pion is now doing IV infusion with Entyvio.  Every other month, working better now and is he is not having to use steroids as much   He is using Repatha- he is taking every 3 weeks right now due to side effects.  However, he feels like he is tolerating a bit better and might try going to standard dose schedule and see how he tolerates/if we can further improve his cholesterol  He likes to exercise at  [employer] and by doing some walking.  No issues with chest pain or shortness of breath with exercise  He was using celebrex around the the time of his last labs - this might have altered his renal function He is using 32 units of insulin right now  Actos 30 Farxiga 10  He is seeing derm on a regular basis   Lab Results  Component Value Date   HGBA1C 7.3 (H) 12/15/2021     He had blood work done earlier this [employer] notes as follows:  Are you taking Repatha?  I believe you had started to get some side effects from this med as well back in June.  Your lipids are not quite as good as at last check   A1c is stable to slightly improved   Your renal function is overall ok, but your BUN is creeping up.  This could indicate an issue with PRE-renal perfusion, such as hypertrophy or plaque build up of the renal arteries.  We might consider getting an ultrasound to check on your renal arteries.  Would you like me to order this for you?   Blood counts are ok        Results for orders placed or performed in visit on 12/15/21  Lipid panel  Result Value Ref Range    Cholesterol 229 (H) 0 - 200 mg/dL    Triglycerides 283.0 (H) 0.0 - 149.0 mg/dL    HDL 42.10 >39.00  mg/dL    VLDL 56.6 (H) 0.0 - 40.0 mg/dL    Total CHOL/HDL Ratio 5      NonHDL 186.82    Hemoglobin A1c  Result Value Ref Range    Hgb A1c MFr Bld 7.3 (H) 4.6 - 6.5 %  Comprehensive metabolic panel  Result Value Ref Range    Sodium 142 135 - 145 mEq/L    Potassium 4.2 3.5 - 5.1 mEq/L    Chloride 100 96 - 112 mEq/L    CO2 35 (H) 19 - 32 mEq/L    Glucose, Bld 140 (H) 70 - 99 mg/dL    BUN 28 (H) 6 - 23 mg/dL    Creatinine, Ser 1.29 0.40 - 1.50 mg/dL    Total Bilirubin 0.6 0.2 - 1.2 mg/dL    Alkaline Phosphatase 41 39 - 117 U/L    AST 21 0 - 37 U/L    ALT 16 0 - 53 U/L    Total Protein 6.7 6.0 - 8.3 g/dL    Albumin 4.2 3.5 - 5.2 g/dL    GFR 59.30 (L) >60.00 mL/min    Calcium 9.2 8.4 - 10.5 mg/dL  CBC  Result Value  Ref Range    WBC 6.1 4.0 - 10.5 K/uL    RBC 5.23 4.22 - 5.81 Mil/uL    Platelets 350.0 150.0 - 400.0 K/uL    Hemoglobin 15.8 13.0 - 17.0 g/dL    HCT 48.0 39.0 - 52.0 %    MCV 91.8 78.0 - 100.0 fl    MCHC 33.0 30.0 - 36.0 g/dL    RDW 14.3 11.5 - 15.5 %  LDL cholesterol, direct  Result Value Ref Range    Direct LDL 110.0 mg/dL    Amlodipine 10 Entocort Celebrex as needed Farxiga 10 Insulin glargine/Basaglar KwikPen Actos 45 Valsartan/HCTZ Venlafaxine Patient Active Problem List   Diagnosis Date Noted   Long-term use of immunosuppressant medication- Entyvio 09/17/2018   Low back pain 06/04/2018   Controlled type 2 diabetes mellitus with complication, with long-term current use of insulin ([place]) 01/29/2017   Essential hypertension 01/29/2017   Crohn's ileocolitis ([place]) 01/29/2017   Mixed hyperlipidemia 01/29/2017   Leg cramps 01/29/2017   History of skin cancer 01/29/2017    Past Medical History:  Diagnosis Date   Arthritis 2020   left hand   Crohn's disease without complication ([place]) 7/35/3299   Diabetes mellitus without complication (HCC)    Dyslipidemia    History of basal cell carcinoma    History of melanoma    History of mood disorder    Hyperlipidemia    Hypertension     Past Surgical History:  Procedure Laterality Date   COLONOSCOPY     KNEE ARTHROSCOPY Right 1999   MELANOMA EXCISION  1999   and squamous and basal cell excisions   TONSILLECTOMY  1965    Social History   Tobacco Use   Smoking status: Former    Types: Cigarettes    Quit date: 1995    Years since quitting: 28.0   Smokeless tobacco: Never  Vaping Use   Vaping Use: Never used  Substance Use Topics   Alcohol use: Yes    Comment: occasional-1-2 per week   Drug use: Never    Family History  Problem Relation Age of Onset   Breast cancer Mother    Heart attack Mother    Cancer Brother        type unknown, mets   Diabetes Brother    Crohn's  disease Paternal Uncle    Rheum  arthritis Paternal Aunt    Stomach cancer Neg Hx    Colon cancer Neg Hx    Esophageal cancer Neg Hx     Allergies  Allergen Reactions   Sulfa Antibiotics Anaphylaxis    "I got these purple spots everywhere"    Ciprofloxacin    Flagyl [Metronidazole]     GI effects    Metformin And Related     Worsens his crohn disease sx, diarrhea    Statins Other (See Comments)    Leg cramps   Mesalamine Other (See Comments)    Gastrointestinal distress    Medication list has been reviewed and updated.  Current Outpatient Medications on File Prior to Visit  Medication Sig Dispense Refill   amLODipine (NORVASC) 10 MG tablet TAKE 1 TABLET (10 MG TOTAL) BY MOUTH DAILY. 90 tablet 0   Blood Glucose Monitoring Suppl (FREESTYLE LITE) DEVI Dispense 1 glucose monitoring device.  Use as directed by MD 1 each 0   Boswellia Serrata (BOSWELLIA PO) Take 1 tablet by mouth 2 (two) times daily.     budesonide (ENTOCORT EC) 3 MG 24 hr capsule Take 1 capsule (3 mg total) by mouth daily. 90 capsule 1   celecoxib (CELEBREX) 200 MG capsule Take 1 capsule (200 mg total) by mouth 2 (two) times daily. 60 capsule 1   Cholecalciferol (VITAMIN D3 PO) Take by mouth. 2500 iu daily     dapagliflozin propanediol (FARXIGA) 10 MG TABS tablet TAKE 1 TABLET BY MOUTH ONCE DAILY 90 tablet 3   diclofenac sodium (VOLTAREN) 1 % GEL Apply 2 g topically 4 (four) times daily. Apply to hand joints as needed. Max 16 gm per joint and 32 grams total daily 100 g 2   Evolocumab (REPATHA SURECLICK) 086 MG/ML SOAJ INJECT 140 MG INTO THE SKIN EVERY 14 DAYS 2 mL 6   glucose blood (FREESTYLE LITE) test strip Use to test glucose 1-2x daily 100 each 12   Insulin Glargine (BASAGLAR KWIKPEN) 100 UNIT/ML INJECT 0.3 MLS (30 UNITS TOTAL) INTO THE SKIN DAILY. 27 mL 3   Insulin Pen Needle (TECHLITE PEN NEEDLES) 32G X 6 MM MISC USE ONCE DAILY. 100 each 1   ipratropium (ATROVENT) 0.03 % nasal spray Place 2 sprays into both nostrils 3 (three) times daily as  needed for nasal drainage 30 mL 12   Multiple Vitamin (MULTIVITAMIN) tablet Take 1 tablet by mouth daily.     Omega-3 Fatty Acids (FISH OIL PO) Take 4 g by mouth daily.     pioglitazone (ACTOS) 45 MG tablet TAKE 1 TABLET BY MOUTH ONCE DAILY 90 tablet 1   Probiotic Product (VSL#3 PO) Take by mouth as needed.     sodium chloride 0.9 % SOLN 250 mL with vedolizumab 300 MG SOLR 300 mg Inject 300 mg into the vein every 8 (eight) weeks.     valsartan-hydrochlorothiazide (DIOVAN-HCT) 320-25 MG tablet TAKE 1 TABLET BY MOUTH DAILY. 90 tablet 0   venlafaxine XR (EFFEXOR-XR) 37.5 MG 24 hr capsule TAKE 1 CAPSULE (37.5 MG TOTAL) BY MOUTH DAILY WITH BREAKFAST. 90 capsule 0   No current facility-administered medications on file prior to visit.    Review of Systems:  As per HPI- otherwise negative.   Physical Examination: Vitals:   12/26/21 0845  BP: 112/78  Pulse: 88  Resp: 18  Temp: 97.9 F (36.6 C)  SpO2: 95%   Vitals:   12/26/21 0845  Weight: 193 lb 12.8 oz (87.9 kg)  Height: 5' 11"  (1.803 m)   Body mass index is 27.03 kg/m. Ideal Body Weight: Weight in (lb) to have BMI = 25: 178.9  GEN: no acute distress.  Minimal overweight, looks well HEENT: Atraumatic, Normocephalic.  Bilateral TM wnl, oropharynx normal.  PEERL,EOMI.   Ears and Nose: No external deformity. CV: RRR, No M/G/R. No JVD. No thrill. No extra heart sounds. PULM: CTA B, no wheezes, crackles, rhonchi. No retractions. No resp. distress. No accessory muscle use. ABD: S, NT, ND No rebound. No HSM. EXTR: No c/c/e PSYCH: Normally interactive. Conversant.   EKG: WNL - rate 83 Assessment and Plan: Controlled type 2 diabetes mellitus with complication, with long-term current use of insulin ([place]) - Plan: Basic metabolic panel, Hemoglobin A1c, CT CARDIAC SCORING (SELF PAY ONLY)  Mixed hyperlipidemia - Plan: Lipid panel  Essential hypertension - Plan: Basic metabolic panel, CT CARDIAC SCORING (SELF PAY ONLY), EKG  12-Lead  Screening for prostate cancer - Plan: PSA  History of skin cancer  Patient seen today for follow-up.  Recent A1c was reasonable, 7.3%.  Would like to get him to around 7% if we can.  He plans to work on exercise Lipids are also a bit higher than ideal, he will try to increase Repatha to every 2 weeks and see if he can tolerate Blood pressure under good control He does have cardiac risk factors including hyperlipidemia, hypertension and diabetes.  Never had any cardiac work-up.  EKG today looks good.  He is interested in doing a coronary calcium score which I have ordered  Plan for follow-up-physical is due in June.  He may come in for labs a little bit earlier, in 3 to 4 months if desired  Signed Lamar Blinks, MD

## 2021-12-23 NOTE — Patient Instructions (Addendum)
It was great to see you again, take care and see me in 6 months assuming all is well I ordered labs if you would like to have them done a bit earlier- say in 4 months EKG is normal We can order a coronary calcium score for you- per my understanding these are only about $100.  If I am wrong about this please let me know!

## 2021-12-26 ENCOUNTER — Ambulatory Visit (INDEPENDENT_AMBULATORY_CARE_PROVIDER_SITE_OTHER): Payer: 59 | Admitting: Family Medicine

## 2021-12-26 VITALS — BP 112/78 | HR 88 | Temp 97.9°F | Resp 18 | Ht 71.0 in | Wt 193.8 lb

## 2021-12-26 DIAGNOSIS — E782 Mixed hyperlipidemia: Secondary | ICD-10-CM | POA: Diagnosis not present

## 2021-12-26 DIAGNOSIS — Z794 Long term (current) use of insulin: Secondary | ICD-10-CM | POA: Diagnosis not present

## 2021-12-26 DIAGNOSIS — E118 Type 2 diabetes mellitus with unspecified complications: Secondary | ICD-10-CM | POA: Diagnosis not present

## 2021-12-26 DIAGNOSIS — Z125 Encounter for screening for malignant neoplasm of prostate: Secondary | ICD-10-CM

## 2021-12-26 DIAGNOSIS — Z85828 Personal history of other malignant neoplasm of skin: Secondary | ICD-10-CM | POA: Diagnosis not present

## 2021-12-26 DIAGNOSIS — I10 Essential (primary) hypertension: Secondary | ICD-10-CM | POA: Diagnosis not present

## 2022-01-02 ENCOUNTER — Other Ambulatory Visit: Payer: Self-pay

## 2022-01-02 ENCOUNTER — Encounter: Payer: Self-pay | Admitting: Family Medicine

## 2022-01-02 ENCOUNTER — Ambulatory Visit (HOSPITAL_BASED_OUTPATIENT_CLINIC_OR_DEPARTMENT_OTHER)
Admission: RE | Admit: 2022-01-02 | Discharge: 2022-01-02 | Disposition: A | Discharge status: Home / Self Care | Payer: 59 | Source: Ambulatory Visit | Attending: Family Medicine | Admitting: Family Medicine

## 2022-01-02 DIAGNOSIS — E118 Type 2 diabetes mellitus with unspecified complications: Secondary | ICD-10-CM | POA: Insufficient documentation

## 2022-01-02 DIAGNOSIS — I10 Essential (primary) hypertension: Secondary | ICD-10-CM | POA: Diagnosis not present

## 2022-01-02 DIAGNOSIS — Z794 Long term (current) use of insulin: Secondary | ICD-10-CM | POA: Diagnosis not present

## 2022-01-03 ENCOUNTER — Encounter: Payer: Self-pay | Admitting: Family Medicine

## 2022-01-05 ENCOUNTER — Other Ambulatory Visit: Payer: Self-pay

## 2022-01-05 ENCOUNTER — Ambulatory Visit (INDEPENDENT_AMBULATORY_CARE_PROVIDER_SITE_OTHER): Payer: 59

## 2022-01-05 VITALS — BP 124/71 | HR 82 | Temp 97.4°F | Resp 16 | Ht 71.0 in | Wt 199.4 lb

## 2022-01-05 DIAGNOSIS — K50818 Crohn's disease of both small and large intestine with other complication: Secondary | ICD-10-CM | POA: Diagnosis not present

## 2022-01-05 MED ORDER — SODIUM CHLORIDE 0.9 % IV SOLN: Freq: Once | INTRAVENOUS | Status: DC | PRN

## 2022-01-05 MED ORDER — FAMOTIDINE IN NACL 20-0.9 MG/50ML-% IV SOLN: 20.0000 mg | Freq: Once | INTRAVENOUS | Status: DC | PRN

## 2022-01-05 MED ORDER — ALBUTEROL SULFATE HFA 108 (90 BASE) MCG/ACT IN AERS: 2.0000 | INHALATION_SPRAY | Freq: Once | RESPIRATORY_TRACT | Status: DC | PRN

## 2022-01-05 MED ORDER — VEDOLIZUMAB 300 MG IV SOLR
300.0000 mg | Freq: Once | INTRAVENOUS | Status: AC
  Administered 2022-01-05: 300 mg via INTRAVENOUS
  Filled 2022-01-05: qty 5

## 2022-01-05 MED ORDER — DIPHENHYDRAMINE HCL 50 MG/ML IJ SOLN: 50.0000 mg | Freq: Once | INTRAMUSCULAR | Status: DC | PRN

## 2022-01-05 MED ORDER — EPINEPHRINE 0.3 MG/0.3ML IJ SOAJ: 0.3000 mg | Freq: Once | INTRAMUSCULAR | Status: DC | PRN

## 2022-01-05 MED ORDER — METHYLPREDNISOLONE SODIUM SUCC 125 MG IJ SOLR: 125.0000 mg | Freq: Once | INTRAMUSCULAR | Status: DC | PRN

## 2022-01-05 NOTE — Progress Notes (Signed)
Diagnosis: Crohn's Disease  Provider:  Marshell Garfinkel, MD  Procedure: Infusion  IV Type: Peripheral, IV Location: R Hand  Entyvio (Vedolizumab), Dose: 300 mg  Infusion Start Time: 9584  Infusion Stop Time: 4171  Post Infusion IV Care: Peripheral IV Discontinued  Discharge: Condition: Good, Destination: Home . AVS provided to patient.   Performed by:  Paul Dykes, RN

## 2022-01-06 ENCOUNTER — Ambulatory Visit: Payer: 59

## 2022-01-09 ENCOUNTER — Other Ambulatory Visit: Payer: Self-pay | Admitting: Pharmacy Technician

## 2022-01-13 ENCOUNTER — Other Ambulatory Visit: Payer: Self-pay | Admitting: Family Medicine

## 2022-01-16 ENCOUNTER — Other Ambulatory Visit (HOSPITAL_BASED_OUTPATIENT_CLINIC_OR_DEPARTMENT_OTHER): Payer: Self-pay

## 2022-01-16 MED ORDER — TECHLITE PEN NEEDLES 32G X 6 MM MISC
1 refills | Status: DC
  Filled 2022-01-16: qty 100, 90d supply, fill #0
  Filled 2022-04-12: qty 100, 90d supply, fill #1

## 2022-02-08 ENCOUNTER — Other Ambulatory Visit: Payer: Self-pay | Admitting: Family Medicine

## 2022-02-08 ENCOUNTER — Other Ambulatory Visit: Payer: Self-pay | Admitting: Internal Medicine

## 2022-02-08 ENCOUNTER — Other Ambulatory Visit (HOSPITAL_BASED_OUTPATIENT_CLINIC_OR_DEPARTMENT_OTHER): Payer: Self-pay

## 2022-02-08 DIAGNOSIS — Z794 Long term (current) use of insulin: Secondary | ICD-10-CM

## 2022-02-08 DIAGNOSIS — E118 Type 2 diabetes mellitus with unspecified complications: Secondary | ICD-10-CM

## 2022-02-09 ENCOUNTER — Other Ambulatory Visit (HOSPITAL_BASED_OUTPATIENT_CLINIC_OR_DEPARTMENT_OTHER): Payer: Self-pay

## 2022-02-09 MED ORDER — CELECOXIB 200 MG PO CAPS
200.0000 mg | ORAL_CAPSULE | Freq: Two times a day (BID) | ORAL | 1 refills | Status: DC
  Filled 2022-02-09: qty 60, 30d supply, fill #0
  Filled 2022-03-13: qty 60, 30d supply, fill #1

## 2022-02-09 MED ORDER — PIOGLITAZONE HCL 45 MG PO TABS
ORAL_TABLET | Freq: Every day | ORAL | 1 refills | Status: DC
  Filled 2022-02-09: qty 90, 90d supply, fill #0
  Filled 2022-05-10: qty 90, 90d supply, fill #1

## 2022-02-13 ENCOUNTER — Encounter: Payer: Self-pay | Admitting: Family Medicine

## 2022-02-21 ENCOUNTER — Telehealth: Payer: Self-pay | Admitting: Pharmacy Technician

## 2022-02-21 NOTE — Telephone Encounter (Signed)
Entyvio co-pay card ? ?PHONE: [phone] ?ID# [id number] ?GR# [id number] ?PCN# [id number] ?BIN# [id number] ? ?

## 2022-02-22 DIAGNOSIS — H35371 Puckering of macula, right eye: Secondary | ICD-10-CM | POA: Diagnosis not present

## 2022-02-22 DIAGNOSIS — H25043 Posterior subcapsular polar age-related cataract, bilateral: Secondary | ICD-10-CM | POA: Diagnosis not present

## 2022-02-22 DIAGNOSIS — H40053 Ocular hypertension, bilateral: Secondary | ICD-10-CM | POA: Diagnosis not present

## 2022-02-22 DIAGNOSIS — H524 Presbyopia: Secondary | ICD-10-CM | POA: Diagnosis not present

## 2022-02-22 DIAGNOSIS — H33321 Round hole, right eye: Secondary | ICD-10-CM | POA: Diagnosis not present

## 2022-02-22 DIAGNOSIS — H52223 Regular astigmatism, bilateral: Secondary | ICD-10-CM | POA: Diagnosis not present

## 2022-02-22 DIAGNOSIS — H35362 Drusen (degenerative) of macula, left eye: Secondary | ICD-10-CM | POA: Diagnosis not present

## 2022-02-22 DIAGNOSIS — H2513 Age-related nuclear cataract, bilateral: Secondary | ICD-10-CM | POA: Diagnosis not present

## 2022-02-22 DIAGNOSIS — H5213 Myopia, bilateral: Secondary | ICD-10-CM | POA: Diagnosis not present

## 2022-03-03 ENCOUNTER — Other Ambulatory Visit: Payer: Self-pay

## 2022-03-03 ENCOUNTER — Ambulatory Visit (INDEPENDENT_AMBULATORY_CARE_PROVIDER_SITE_OTHER): Payer: 59

## 2022-03-03 VITALS — BP 142/82 | HR 88 | Temp 98.2°F | Resp 18 | Ht 71.0 in | Wt 196.8 lb

## 2022-03-03 DIAGNOSIS — K50818 Crohn's disease of both small and large intestine with other complication: Secondary | ICD-10-CM

## 2022-03-03 MED ORDER — VEDOLIZUMAB 300 MG IV SOLR
300.0000 mg | Freq: Once | INTRAVENOUS | Status: AC
  Administered 2022-03-03: 300 mg via INTRAVENOUS
  Filled 2022-03-03: qty 5

## 2022-03-03 NOTE — Progress Notes (Signed)
Diagnosis: Crohn's Disease ? ?Provider:  Marshell Garfinkel, MD ? ?Procedure: Infusion ? ?IV Type: Peripheral, IV Location: L Antecubital ? ?Entyvio (Vedolizumab), Dose: 300 mg ? ?Infusion Start Time: 14.25 ? ?Infusion Stop Time: 15.00 ? ?Post Infusion IV Care: Peripheral IV Discontinued ? ?Discharge: Condition: Good, Destination: Home . AVS provided to patient.  ? ?Performed by:  Dencil Cayson, RN  ?  ?

## 2022-03-13 ENCOUNTER — Other Ambulatory Visit: Payer: Self-pay | Admitting: Internal Medicine

## 2022-03-14 ENCOUNTER — Other Ambulatory Visit (HOSPITAL_BASED_OUTPATIENT_CLINIC_OR_DEPARTMENT_OTHER): Payer: Self-pay

## 2022-03-14 MED ORDER — BUDESONIDE 3 MG PO CPEP
3.0000 mg | ORAL_CAPSULE | Freq: Every day | ORAL | 0 refills | Status: DC
  Filled 2022-03-14: qty 90, 90d supply, fill #0

## 2022-03-22 ENCOUNTER — Encounter: Payer: Self-pay | Admitting: Internal Medicine

## 2022-03-23 NOTE — Telephone Encounter (Signed)
Pt was made aware of Dr. Carlean Purl recommendation for an Office Visit: ?Pt was scheduled for an office visit on 04/18/2022 at 3:30 PM with Dr. Carlean Purl: Pt made aware ?Pt verbalized understanding with all questions answered.  ? ?

## 2022-03-26 ENCOUNTER — Other Ambulatory Visit: Payer: Self-pay | Admitting: Family Medicine

## 2022-03-26 DIAGNOSIS — I10 Essential (primary) hypertension: Secondary | ICD-10-CM

## 2022-03-27 ENCOUNTER — Other Ambulatory Visit (HOSPITAL_BASED_OUTPATIENT_CLINIC_OR_DEPARTMENT_OTHER): Payer: Self-pay

## 2022-03-27 MED ORDER — AMLODIPINE BESYLATE 10 MG PO TABS
ORAL_TABLET | Freq: Every day | ORAL | 0 refills | Status: DC
  Filled 2022-03-27: qty 90, 90d supply, fill #0

## 2022-03-27 MED ORDER — VALSARTAN-HYDROCHLOROTHIAZIDE 320-25 MG PO TABS
1.0000 | ORAL_TABLET | Freq: Every day | ORAL | 0 refills | Status: DC
  Filled 2022-03-27: qty 90, 90d supply, fill #0

## 2022-03-27 MED ORDER — VENLAFAXINE HCL ER 37.5 MG PO CP24
ORAL_CAPSULE | Freq: Every day | ORAL | 0 refills | Status: DC
  Filled 2022-03-27: qty 90, 90d supply, fill #0

## 2022-03-29 ENCOUNTER — Encounter: Payer: Self-pay | Admitting: Family Medicine

## 2022-03-29 ENCOUNTER — Telehealth: Payer: Self-pay

## 2022-03-29 NOTE — Telephone Encounter (Signed)
Fax received that [facility] is non-preferred. Preferred agents are: Jiles Garter, and [place]. Pts pharmacy is [facility]. ? ?Please advise.  ?

## 2022-04-13 ENCOUNTER — Other Ambulatory Visit (HOSPITAL_BASED_OUTPATIENT_CLINIC_OR_DEPARTMENT_OTHER): Payer: Self-pay

## 2022-04-18 ENCOUNTER — Other Ambulatory Visit (HOSPITAL_BASED_OUTPATIENT_CLINIC_OR_DEPARTMENT_OTHER): Payer: Self-pay

## 2022-04-18 ENCOUNTER — Encounter: Payer: Self-pay | Admitting: Internal Medicine

## 2022-04-18 ENCOUNTER — Ambulatory Visit: Payer: 59 | Admitting: Internal Medicine

## 2022-04-18 VITALS — BP 122/60 | HR 86 | Ht 71.65 in | Wt 193.0 lb

## 2022-04-18 DIAGNOSIS — M158 Other polyosteoarthritis: Secondary | ICD-10-CM | POA: Diagnosis not present

## 2022-04-18 DIAGNOSIS — Z7960 Long term (current) use of unspecified immunomodulators and immunosuppressants: Secondary | ICD-10-CM

## 2022-04-18 DIAGNOSIS — K50818 Crohn's disease of both small and large intestine with other complication: Secondary | ICD-10-CM | POA: Diagnosis not present

## 2022-04-18 MED ORDER — PEG-KCL-NACL-NASULF-NA ASC-C 100 G PO SOLR
1.0000 | Freq: Once | ORAL | 0 refills | Status: AC
Start: 2022-04-18 — End: 2022-05-26
  Filled 2022-04-18 – 2022-05-25 (×2): qty 1, 1d supply, fill #0

## 2022-04-18 NOTE — Progress Notes (Addendum)
? ?Thomas Peterson 61 y.o. March 02, 1961 [id number] ? ?Assessment & Plan:  ? ?Encounter Diagnoses  ?Name Primary?  ? Crohn's disease of both small and large intestine with other complication ([place]) Yes  ? Long-term use of immunosuppressant medication- Entyvio   ? Other osteoarthritis involving multiple joints   ? ?Intermittent symptoms despite good trough levels of Entyvio.  Currently well after a brief flare treated with budesonide. ? ?Reassess with colonoscopy later this year.  It will be 1 year treatment with Entyvio in May. ? ? ?Celebrex may be taken on a more regular basis if desired for his arthritis symptoms. ? ?We talked about the role of diet and health and disease and inflammation and how I think lower carbohydrate eating is beneficial in many ways.  I have given him the websites of a couple of GI dietitians that are available online and have also encouraged him to look at [facility] on line as a possible way to change his diet which I think could help his diabetes and his Crohn's disease.  They have a program of lower carbohydrate eating and it has been proven in randomized controlled trials to help people reduce medications and diabetes and even "reverse" diabetes that is to say come off of medications completely. ? ? ? ?Subjective:  ? ?Chief Complaint: Follow-up of Crohn's disease. ? ?HPI ?61 year old man with a history of Crohn's disease of the small and large intestine, currently on Entyvio treatment coming up on a 1 year anniversary later this month.  He has generally done better but occasionally has flares of abdominal pain and bowel habit changes and will take budesonide intermittently.  Last year I prescribed Celebrex for arthritis.  He finds that helpful and is wondering about taking it on a more regular basis because it has been bothering him a fair amount. ? ?He recently started a supplement called IrriTab which is an IBS OTC supplement with psyllium and a variety of other substances including  digestive enzymes we reviewed it. ? ?Colonoscopy March 2022 prior to going on Entyvio with 2 diminutive polyps-normal-looking ileum and patchy erythema in the entire colon. ? ?Diagnosis ?1. Surgical [P], small bowel, terminal ileum ?- ILEAL MUCOSA WITH NO SIGNIFICANT PATHOLOGIC FINDINGS. ?- NEGATIVE FOR ACTIVE INFLAMMATION, GRANULOMATA AND DYSPLASIA. ?2. Surgical [P], colon, ascending and cecum ?- MILDLY ACTIVE CHRONIC COLITIS WITH SCATTERED POORLY FORMED NONNECROTIZING MICROGRANULOMATA, ?CONSISTENT WITH CROHN'S DISEASE. ?- NEGATIVE FOR DYSPLASIA. ?3. Surgical [P], colon, tranverse ?- MILDLY ACTIVE CHRONIC COLITIS WITH SCATTERED POORLY FORMED NONNECROTIZING MICROGRANULOMATA, ?CONSISTENT WITH CROHN'S DISEASE. ?- NEGATIVE FOR DYSPLASIA. ?4. Surgical [P], colon, transverse, polyp (2) ?- INFLAMMATORY POLYP (X MULTIPLE). ?- NEGATIVE FOR DYSPLASIA. ?5. Surgical [P], colon, descending and proximal sigmoid ?- INACTIVE CHRONIC COLITIS WITH ISOLATED POORLY FORMED MICROGRANULOMA, CONSISTENT WITH CROHN'S ?DISEASE. ?- NEGATIVE FOR DYSPLASIA. ?6. Surgical [P], colon, distal sigmoid and rectum ?- COLONIC MUCOSA WITH NO SIGNIFICANT PATHOLOGIC FINDINGS. ?- NEGATIVE FOR ACTIVE INFLAMMATION, GRANULOMATA AND DYSPLASIA. ?Allergies  ?Allergen Reactions  ? Sulfa Antibiotics Anaphylaxis  ?  "I got these purple spots everywhere"   ? Ciprofloxacin   ? Flagyl [Metronidazole]   ?  GI effects   ? Metformin And Related   ?  Worsens his crohn disease sx, diarrhea   ? Statins Other (See Comments)  ?  Leg cramps  ? Mesalamine Other (See Comments)  ?  Gastrointestinal distress  ? ?Current Meds  ?Medication Sig  ? amLODipine (NORVASC) 10 MG tablet TAKE 1 TABLET (10 MG TOTAL) BY MOUTH DAILY.  ?  Blood Glucose Monitoring Suppl (FREESTYLE LITE) DEVI Dispense 1 glucose monitoring device.  Use as directed by MD  ? Boswellia Serrata (BOSWELLIA PO) Take 1 tablet by mouth 2 (two) times daily.  ? budesonide (ENTOCORT EC) 3 MG 24 hr capsule Take 1 capsule (3 mg  total) by mouth daily.  ? celecoxib (CELEBREX) 200 MG capsule Take 1 capsule (200 mg total) by mouth 2 (two) times daily.  ? Cholecalciferol (VITAMIN D3 PO) Take by mouth. 2500 iu daily  ? dapagliflozin propanediol (FARXIGA) 10 MG TABS tablet TAKE 1 TABLET BY MOUTH ONCE DAILY  ? diclofenac sodium (VOLTAREN) 1 % GEL Apply 2 g topically 4 (four) times daily. Apply to hand joints as needed. Max 16 gm per joint and 32 grams total daily  ? Evolocumab (REPATHA SURECLICK) 093 MG/ML SOAJ INJECT 140 MG INTO THE SKIN EVERY 14 DAYS  ? glucose blood (FREESTYLE LITE) test strip Use to test glucose 1-2x daily  ? Insulin Glargine (BASAGLAR KWIKPEN) 100 UNIT/ML INJECT 0.3 MLS (30 UNITS TOTAL) INTO THE SKIN DAILY.  ? Insulin Pen Needle (TECHLITE PEN NEEDLES) 32G X 6 MM MISC USE ONCE DAILY.  ? ipratropium (ATROVENT) 0.03 % nasal spray Place 2 sprays into both nostrils 3 (three) times daily as needed for nasal drainage  ? Multiple Vitamin (MULTIVITAMIN) tablet Take 1 tablet by mouth daily.  ? Omega-3 Fatty Acids (FISH OIL PO) Take 4 g by mouth daily.  ? pioglitazone (ACTOS) 45 MG tablet TAKE 1 TABLET BY MOUTH ONCE DAILY  ? Probiotic Product (VSL#3 PO) Take by mouth as needed.  ? sodium chloride 0.9 % SOLN 250 mL with vedolizumab 300 MG SOLR 300 mg Inject 300 mg into the vein every 8 (eight) weeks.  ? valsartan-hydrochlorothiazide (DIOVAN-HCT) 320-25 MG tablet TAKE 1 TABLET BY MOUTH DAILY.  ? venlafaxine XR (EFFEXOR-XR) 37.5 MG 24 hr capsule TAKE 1 CAPSULE (37.5 MG TOTAL) BY MOUTH DAILY WITH BREAKFAST.  ? ?Past Medical History:  ?Diagnosis Date  ? Arthritis 2020  ? left hand  ? Crohn's disease without complication ([place]) 2/67/1245  ? Diabetes mellitus without complication ([place])   ? Dyslipidemia   ? History of basal cell carcinoma   ? History of melanoma   ? History of mood disorder   ? Hyperlipidemia   ? Hypertension   ? ?Past Surgical History:  ?Procedure Laterality Date  ? COLONOSCOPY    ? KNEE ARTHROSCOPY Right 1999  ? MELANOMA  EXCISION  1999  ? and squamous and basal cell excisions  ? TONSILLECTOMY  1965  ? ?Social History  ? ?Social History Narrative  ? 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 Radiation physicist, second career, [employer] radiation therapy, employed by [employer] health  ? Prior smoker occasional alcohol 3 coffees a day no drugs no tobacco now  ? 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 ?family history includes Breast cancer in his mother; Cancer in his brother; Crohn's disease in his paternal uncle; Diabetes in his brother; Heart attack in his mother; Rheum arthritis in his paternal aunt. ? ? ?Review of Systems ?As per HPI ? ?Objective:  ? Physical Exam ?BP 122/60   Pulse 86   Ht 5' 11.65" (1.82 m)   Wt 193 lb (87.5 kg)   BMI 26.43 kg/m?  ?Abdomen is soft nontender bowel sounds present no mass ? ?

## 2022-04-18 NOTE — Patient Instructions (Addendum)
These two online dietitians may help - check them out. ? ?SkinPromotion.no ?  ?http://www.anderson.info/ ?  ? ?[employer] is the service that uses low-carb diet to reduce insulin and other diabetes medication use and even "reverse" diabetes in some patients. Check it out, too. ? ?You have been scheduled for a colonoscopy. Please follow written instructions given to you at your visit today.  ?Please pick up your prep supplies at the pharmacy within the next 1-3 days. ?If you use inhalers (even only as needed), please bring them with you on the day of your procedure. ? ?I appreciate the opportunity to care for you. ?Silvano Rusk, MD, [facility] ? ? ? ?

## 2022-04-19 ENCOUNTER — Other Ambulatory Visit (HOSPITAL_BASED_OUTPATIENT_CLINIC_OR_DEPARTMENT_OTHER): Payer: Self-pay

## 2022-04-28 ENCOUNTER — Other Ambulatory Visit (HOSPITAL_BASED_OUTPATIENT_CLINIC_OR_DEPARTMENT_OTHER): Payer: Self-pay

## 2022-04-28 ENCOUNTER — Other Ambulatory Visit (INDEPENDENT_AMBULATORY_CARE_PROVIDER_SITE_OTHER): Payer: 59

## 2022-04-28 ENCOUNTER — Ambulatory Visit (INDEPENDENT_AMBULATORY_CARE_PROVIDER_SITE_OTHER): Payer: 59

## 2022-04-28 ENCOUNTER — Encounter: Payer: Self-pay | Admitting: Family Medicine

## 2022-04-28 VITALS — BP 124/80 | HR 77 | Temp 98.0°F | Resp 16 | Ht 71.0 in | Wt 192.0 lb

## 2022-04-28 DIAGNOSIS — E782 Mixed hyperlipidemia: Secondary | ICD-10-CM | POA: Diagnosis not present

## 2022-04-28 DIAGNOSIS — Z125 Encounter for screening for malignant neoplasm of prostate: Secondary | ICD-10-CM | POA: Diagnosis not present

## 2022-04-28 DIAGNOSIS — K50818 Crohn's disease of both small and large intestine with other complication: Secondary | ICD-10-CM

## 2022-04-28 DIAGNOSIS — E118 Type 2 diabetes mellitus with unspecified complications: Secondary | ICD-10-CM

## 2022-04-28 DIAGNOSIS — Z794 Long term (current) use of insulin: Secondary | ICD-10-CM | POA: Diagnosis not present

## 2022-04-28 DIAGNOSIS — I10 Essential (primary) hypertension: Secondary | ICD-10-CM | POA: Diagnosis not present

## 2022-04-28 LAB — BASIC METABOLIC PANEL
BUN: 23 mg/dL (ref 6–23)
CO2: 32 mEq/L (ref 19–32)
Calcium: 9.1 mg/dL (ref 8.4–10.5)
Chloride: 100 mEq/L (ref 96–112)
Creatinine, Ser: 1.28 mg/dL (ref 0.40–1.50)
GFR: 59.71 mL/min — ABNORMAL LOW (ref >60.00)
Glucose, Bld: 118 mg/dL — ABNORMAL HIGH (ref 70–99)
Potassium: 3.4 mEq/L — ABNORMAL LOW (ref 3.5–5.1)
Sodium: 141 mEq/L (ref 135–145)

## 2022-04-28 LAB — HEMOGLOBIN A1C: Hgb A1c MFr Bld: 7.5 % — ABNORMAL HIGH (ref 4.6–6.5)

## 2022-04-28 LAB — LIPID PANEL
Cholesterol: 193 mg/dL (ref 0–200)
HDL: 49.9 mg/dL (ref >39.00)
LDL Cholesterol: 116 mg/dL — ABNORMAL HIGH (ref 0–99)
NonHDL: 143.22
Total CHOL/HDL Ratio: 4
Triglycerides: 135 mg/dL (ref 0.0–149.0)
VLDL: 27 mg/dL (ref 0.0–40.0)

## 2022-04-28 LAB — PSA: PSA: 1.3 ng/mL (ref 0.10–4.00)

## 2022-04-28 MED ORDER — VEDOLIZUMAB 300 MG IV SOLR
300.0000 mg | Freq: Once | INTRAVENOUS | Status: AC
  Administered 2022-04-28: 300 mg via INTRAVENOUS
  Filled 2022-04-28: qty 5

## 2022-04-28 NOTE — Progress Notes (Signed)
Diagnosis: Crohn's Disease  Provider:  Marshell Garfinkel, MD  Procedure: Infusion  IV Type: Peripheral, IV Location: L Antecubital  Entyvio (Vedolizumab), Dose: 300 mg  Infusion Start Time: 1502  Infusion Stop Time: 2263  Post Infusion IV Care: Peripheral IV Discontinued  Discharge: Condition: Good, Destination: Home . AVS provided to patient.   Performed by:  Adelina Mings, LPN

## 2022-05-11 ENCOUNTER — Other Ambulatory Visit (HOSPITAL_BASED_OUTPATIENT_CLINIC_OR_DEPARTMENT_OTHER): Payer: Self-pay

## 2022-05-25 ENCOUNTER — Other Ambulatory Visit (HOSPITAL_BASED_OUTPATIENT_CLINIC_OR_DEPARTMENT_OTHER): Payer: Self-pay

## 2022-05-25 DIAGNOSIS — H40053 Ocular hypertension, bilateral: Secondary | ICD-10-CM | POA: Diagnosis not present

## 2022-05-25 DIAGNOSIS — H25043 Posterior subcapsular polar age-related cataract, bilateral: Secondary | ICD-10-CM | POA: Diagnosis not present

## 2022-05-25 DIAGNOSIS — H2513 Age-related nuclear cataract, bilateral: Secondary | ICD-10-CM | POA: Diagnosis not present

## 2022-05-25 MED ORDER — TIMOLOL MALEATE 0.5 % OP SOLN
OPHTHALMIC | 11 refills | Status: DC
  Filled 2022-05-25: qty 10, 25d supply, fill #0
  Filled 2022-06-18: qty 10, 25d supply, fill #1

## 2022-06-15 ENCOUNTER — Encounter: Payer: Self-pay | Admitting: Internal Medicine

## 2022-06-18 ENCOUNTER — Other Ambulatory Visit: Payer: Self-pay | Admitting: Internal Medicine

## 2022-06-19 ENCOUNTER — Other Ambulatory Visit (HOSPITAL_BASED_OUTPATIENT_CLINIC_OR_DEPARTMENT_OTHER): Payer: Self-pay

## 2022-06-19 MED ORDER — CELECOXIB 200 MG PO CAPS
200.0000 mg | ORAL_CAPSULE | Freq: Two times a day (BID) | ORAL | 1 refills | Status: DC
  Filled 2022-06-19: qty 60, 30d supply, fill #0
  Filled 2022-07-16: qty 60, 30d supply, fill #1

## 2022-06-19 MED ORDER — BUDESONIDE 3 MG PO CPEP
3.0000 mg | ORAL_CAPSULE | Freq: Every day | ORAL | 0 refills | Status: DC
  Filled 2022-06-19: qty 90, 90d supply, fill #0

## 2022-06-20 ENCOUNTER — Other Ambulatory Visit (HOSPITAL_BASED_OUTPATIENT_CLINIC_OR_DEPARTMENT_OTHER): Payer: Self-pay

## 2022-06-20 ENCOUNTER — Other Ambulatory Visit: Payer: Self-pay | Admitting: Family Medicine

## 2022-06-20 DIAGNOSIS — Z794 Long term (current) use of insulin: Secondary | ICD-10-CM

## 2022-06-20 MED ORDER — FREESTYLE LITE TEST VI STRP
ORAL_STRIP | 12 refills | Status: DC
  Filled 2022-06-20: qty 100, 50d supply, fill #0

## 2022-06-22 ENCOUNTER — Ambulatory Visit (AMBULATORY_SURGERY_CENTER): Payer: 59 | Admitting: Internal Medicine

## 2022-06-22 ENCOUNTER — Encounter: Payer: Self-pay | Admitting: Internal Medicine

## 2022-06-22 VITALS — BP 114/70 | HR 74 | Temp 98.0°F | Resp 12 | Ht 71.0 in | Wt 193.0 lb

## 2022-06-22 DIAGNOSIS — K529 Noninfective gastroenteritis and colitis, unspecified: Secondary | ICD-10-CM | POA: Diagnosis not present

## 2022-06-22 DIAGNOSIS — D124 Benign neoplasm of descending colon: Secondary | ICD-10-CM | POA: Diagnosis not present

## 2022-06-22 DIAGNOSIS — D123 Benign neoplasm of transverse colon: Secondary | ICD-10-CM

## 2022-06-22 DIAGNOSIS — K50818 Crohn's disease of both small and large intestine with other complication: Secondary | ICD-10-CM

## 2022-06-22 DIAGNOSIS — E119 Type 2 diabetes mellitus without complications: Secondary | ICD-10-CM | POA: Diagnosis not present

## 2022-06-22 DIAGNOSIS — E785 Hyperlipidemia, unspecified: Secondary | ICD-10-CM | POA: Diagnosis not present

## 2022-06-22 DIAGNOSIS — K635 Polyp of colon: Secondary | ICD-10-CM | POA: Diagnosis not present

## 2022-06-22 DIAGNOSIS — I10 Essential (primary) hypertension: Secondary | ICD-10-CM | POA: Diagnosis not present

## 2022-06-22 MED ORDER — SODIUM CHLORIDE 0.9 % IV SOLN: 500.0000 mL | Freq: Once | INTRAVENOUS | Status: DC

## 2022-06-22 NOTE — Progress Notes (Signed)
Pt non-responsive, VVS, Report to RN  °

## 2022-06-22 NOTE — Op Note (Signed)
[place] Patient Name: Thomas Peterson Procedure Date: 06/22/2022 3:33 PM MRN: [id number] Endoscopist: Gatha Mayer , MD Age: 61 Referring MD:  Date of Birth: [birth year] Gender: Male Account #: [id number] Procedure:                Colonoscopy Indications:              Crohn's disease of the small bowel and colon,                            Follow-up of Crohn's disease of the small bowel and                            colon, Disease activity assessment of Crohn's                            disease of the small bowel and colon, Assess                            therapeutic response to therapy of Crohn's disease                            of the small bowel and colon Medicines:                Monitored Anesthesia Care Procedure:                Pre-Anesthesia Assessment:                           - Prior to the procedure, a History and Physical                            was performed, and patient medications and                            allergies were reviewed. The patient's tolerance of                            previous anesthesia was also reviewed. The risks                            and benefits of the procedure and the sedation                            options and risks were discussed with the patient.                            All questions were answered, and informed consent                            was obtained. Prior Anticoagulants: The patient has                            taken no previous anticoagulant or antiplatelet  agents. ASA Grade Assessment: II - A patient with                            mild systemic disease. After reviewing the risks                            and benefits, the patient was deemed in                            satisfactory condition to undergo the procedure.                           After obtaining informed consent, the colonoscope                            was passed under direct vision. Throughout the                             procedure, the patient's blood pressure, pulse, and                            oxygen saturations were monitored continuously. The                            Olympus CF-HQ190L [id number]) Colonoscope was                            introduced through the anus and advanced to the the                            terminal ileum, with identification of the                            appendiceal orifice and IC valve. The colonoscopy                            was performed without difficulty. The patient                            tolerated the procedure well. The quality of the                            bowel preparation was good. The terminal ileum,                            ileocecal valve, appendiceal orifice, and rectum                            were photographed. Scope In: 3:53:39 PM Scope Out: 4:14:05 PM Scope Withdrawal Time: 0 hours 17 minutes 5 seconds  Total Procedure Duration: 0 hours 20 minutes 26 seconds  Findings:                 The perianal and digital rectal examinations were  normal.                           The terminal ileum appeared normal. Biopsies were                            taken with a cold forceps for histology.                            Verification of patient identification for the                            specimen was done. Estimated blood loss was minimal.                           Three sessile polyps were found in the descending                            colon and transverse colon. The polyps were 5 to 8                            mm in size. These polyps were removed with a cold                            snare. Resection and retrieval were complete.                            Verification of patient identification for the                            specimen was done. Estimated blood loss was minimal.                           External hemorrhoids were found.                           The exam was  otherwise without abnormality on                            direct and retroflexion views.                           Two biopsies were taken every 10 cm with a cold                            forceps for Crohn's disease surveillance. These                            biopsy specimens were sent to Pathology.                            Verification of patient identification for the  specimen was done. Estimated blood loss was minimal. Complications:            No immediate complications. Estimated blood loss:                            Minimal. Estimated Blood Loss:     Estimated blood loss was minimal. Impression:               - The examined portion of the ileum was normal.                            Biopsied.                           - Three 5 to 8 mm polyps in the descending colon                            and in the transverse colon, removed with a cold                            snare. Resected and retrieved. There were other                            polyps seen that look like post-inflammatory                            changes and as I saw those believe those removed                            were same - numerous and diminutive - not removed.                           - External hemorrhoids.                           - The examination was otherwise normal on direct                            and retroflexion views. I did not see any signs of                            active inflammation - improved over last year.                           - Biopsies for surveillance were taken.                            Cecum/ascending, transverse, descending/sigmoid,                            sigmoid/rectum. Recommendation:           - Patient has a contact number available for  emergencies. The signs and symptoms of potential                            delayed complications were discussed with the                            patient. Return  to normal activities tomorrow.                            Written discharge instructions were provided to the                            patient.                           - Resume previous diet.                           - Continue present medications.                           - Repeat colonoscopy is recommended. The                            colonoscopy date will be determined after pathology                            results from today's exam become available for                            review. Gatha Mayer, MD 06/22/2022 4:28:24 PM This report has been signed electronically.

## 2022-06-22 NOTE — Progress Notes (Signed)
[facility] History and Physical   Primary Care Physician:  Darreld Mclean, MD   Reason for Procedure:   Crohn's  Plan:    colonoscopy     HPI: Thomas Peterson is a 61 y.o. male here to reassess Crohn's of large and small intestine   Past Medical History:  Diagnosis Date   Arthritis 2020   left hand   Crohn's disease without complication ([place]) 81/84/0375   Diabetes mellitus without complication ([place])    Dyslipidemia    Glaucoma    bilateral   History of basal cell carcinoma    History of melanoma    History of mood disorder    Hyperlipidemia    Hypertension     Past Surgical History:  Procedure Laterality Date   COLONOSCOPY     KNEE ARTHROSCOPY Right [place]   and squamous and basal cell excisions   TONSILLECTOMY  1965    Prior to Admission medications   Medication Sig Start Date End Date Taking? Authorizing Provider  amLODipine (NORVASC) 10 MG tablet TAKE 1 TABLET (10 MG TOTAL) BY MOUTH DAILY. 03/27/22  Yes [name], Gay Filler, MD  Blood Glucose Monitoring Suppl (FREESTYLE LITE) DEVI Dispense 1 glucose monitoring device.  Use as directed by MD 10/08/18  Yes [name], Gay Filler, MD  celecoxib (CELEBREX) 200 MG capsule Take 1 capsule (200 mg total) by mouth 2 (two) times daily. 06/19/22  Yes Gatha Mayer, MD  Cholecalciferol (VITAMIN D3 PO) Take by mouth. 2500 iu daily   Yes [provider]  dapagliflozin propanediol (FARXIGA) 10 MG TABS tablet TAKE 1 TABLET BY MOUTH ONCE DAILY 07/22/21 09/18/22 Yes [name], Gay Filler, MD  glucose blood (FREESTYLE LITE) test strip Use to test glucose 1-2 times a day 06/20/22  Yes [name], Gay Filler, MD  Insulin Glargine (BASAGLAR KWIKPEN) 100 UNIT/ML INJECT 0.3 MLS (30 UNITS TOTAL) INTO THE SKIN DAILY. 06/17/21 06/25/22 Yes [name], Gay Filler, MD  Insulin Pen Needle (TECHLITE PEN NEEDLES) 32G X 6 MM MISC USE ONCE DAILY. 01/16/22  Yes Ann Held, DO  Multiple Vitamin (MULTIVITAMIN) tablet  Take 1 tablet by mouth daily.   Yes [provider]  Omega-3 Fatty Acids (FISH OIL PO) Take 4 g by mouth daily.   Yes [provider]  pioglitazone (ACTOS) 45 MG tablet TAKE 1 TABLET BY MOUTH ONCE DAILY 02/09/22  Yes [name], Gay Filler, MD  Probiotic Product (VSL#3 PO) Take by mouth as needed.   Yes [provider]  sodium chloride 0.9 % SOLN 250 mL with vedolizumab 300 MG SOLR 300 mg Inject 300 mg into the vein every 8 (eight) weeks. 11/20/21  Yes Gatha Mayer, MD  timolol (TIMOPTIC) 0.5 % ophthalmic solution Place 1 drop into both eyes 2 times daily. 05/25/22  Yes   valsartan-hydrochlorothiazide (DIOVAN-HCT) 320-25 MG tablet TAKE 1 TABLET BY MOUTH DAILY. 03/27/22  Yes [name], Gay Filler, MD  venlafaxine XR (EFFEXOR-XR) 37.5 MG 24 hr capsule TAKE 1 CAPSULE (37.5 MG TOTAL) BY MOUTH DAILY WITH BREAKFAST. 03/27/22  Yes [name], Gay Filler, MD  Boswellia Serrata (BOSWELLIA PO) Take 1 tablet by mouth 2 (two) times daily.    [provider]  budesonide (ENTOCORT EC) 3 MG 24 hr capsule Take 1 capsule (3 mg total) by mouth daily. 06/19/22 06/19/23  Gatha Mayer, MD  diclofenac sodium (VOLTAREN) 1 % GEL Apply 2 g topically 4 (four) times daily. Apply to hand joints as needed. Max 16  gm per joint and 32 grams total daily 05/19/19   [name], Gay Filler, MD  Evolocumab (REPATHA SURECLICK) 826 MG/ML SOAJ INJECT 140 MG INTO THE SKIN EVERY 14 DAYS 12/15/21 12/15/22  [name], Gay Filler, MD  ipratropium (ATROVENT) 0.03 % nasal spray Place 2 sprays into both nostrils 3 (three) times daily as needed for nasal drainage 05/23/21   [name], Gay Filler, MD    Current Outpatient Medications  Medication Sig Dispense Refill   amLODipine (NORVASC) 10 MG tablet TAKE 1 TABLET (10 MG TOTAL) BY MOUTH DAILY. 90 tablet 0   Blood Glucose Monitoring Suppl (FREESTYLE LITE) DEVI Dispense 1 glucose monitoring device.  Use as directed by MD 1 each 0   celecoxib (CELEBREX) 200 MG capsule Take 1 capsule (200 mg  total) by mouth 2 (two) times daily. 60 capsule 1   Cholecalciferol (VITAMIN D3 PO) Take by mouth. 2500 iu daily     dapagliflozin propanediol (FARXIGA) 10 MG TABS tablet TAKE 1 TABLET BY MOUTH ONCE DAILY 90 tablet 3   glucose blood (FREESTYLE LITE) test strip Use to test glucose 1-2 times a day 100 each 12   Insulin Glargine (BASAGLAR KWIKPEN) 100 UNIT/ML INJECT 0.3 MLS (30 UNITS TOTAL) INTO THE SKIN DAILY. 27 mL 3   Insulin Pen Needle (TECHLITE PEN NEEDLES) 32G X 6 MM MISC USE ONCE DAILY. 100 each 1   Multiple Vitamin (MULTIVITAMIN) tablet Take 1 tablet by mouth daily.     Omega-3 Fatty Acids (FISH OIL PO) Take 4 g by mouth daily.     pioglitazone (ACTOS) 45 MG tablet TAKE 1 TABLET BY MOUTH ONCE DAILY 90 tablet 1   Probiotic Product (VSL#3 PO) Take by mouth as needed.     sodium chloride 0.9 % SOLN 250 mL with vedolizumab 300 MG SOLR 300 mg Inject 300 mg into the vein every 8 (eight) weeks.     timolol (TIMOPTIC) 0.5 % ophthalmic solution Place 1 drop into both eyes 2 times daily. 10 mL 11   valsartan-hydrochlorothiazide (DIOVAN-HCT) 320-25 MG tablet TAKE 1 TABLET BY MOUTH DAILY. 90 tablet 0   venlafaxine XR (EFFEXOR-XR) 37.5 MG 24 hr capsule TAKE 1 CAPSULE (37.5 MG TOTAL) BY MOUTH DAILY WITH BREAKFAST. 90 capsule 0   Boswellia Serrata (BOSWELLIA PO) Take 1 tablet by mouth 2 (two) times daily.     budesonide (ENTOCORT EC) 3 MG 24 hr capsule Take 1 capsule (3 mg total) by mouth daily. 90 capsule 0   diclofenac sodium (VOLTAREN) 1 % GEL Apply 2 g topically 4 (four) times daily. Apply to hand joints as needed. Max 16 gm per joint and 32 grams total daily 100 g 2   Evolocumab (REPATHA SURECLICK) 415 MG/ML SOAJ INJECT 140 MG INTO THE SKIN EVERY 14 DAYS 2 mL 6   ipratropium (ATROVENT) 0.03 % nasal spray Place 2 sprays into both nostrils 3 (three) times daily as needed for nasal drainage 30 mL 12   Current Facility-Administered Medications  Medication Dose Route Frequency Provider Last Rate Last  Admin   0.9 %  sodium chloride infusion  500 mL Intravenous Once Gatha Mayer, MD        Allergies as of 06/22/2022 - Review Complete 06/22/2022  Allergen Reaction Noted   Sulfa antibiotics Anaphylaxis 01/29/2017   Ciprofloxacin  08/16/2017   Flagyl [metronidazole]  01/29/2017   Metformin and related  03/06/2019   Statins Other (See Comments) 01/29/2017   Mesalamine Other (See Comments) 04/14/2021    Family History  Problem  Relation Age of Onset   Breast cancer Mother    Heart attack Mother    Cancer Brother        type unknown, mets   Diabetes Brother    Crohn's disease Paternal Uncle    Rheum arthritis Paternal Aunt    Stomach cancer Neg Hx    Colon cancer Neg Hx    Esophageal cancer Neg Hx     Social History   Socioeconomic History   Marital status: Married    Spouse name: Not on file   Number of children: 1   Years of education: Not on file   Highest education level: Not on file  Occupational History   Occupation: Agricultural engineer: [employer]  Tobacco Use   Smoking status: Former    Types: Cigarettes    Quit date: 1995    Years since quitting: 28.5   Smokeless tobacco: Never  Vaping Use   Vaping Use: Never used  Substance and Sexual Activity   Alcohol use: Yes    Comment: occasional-1-2 per week   Drug use: Never   Sexual activity: Not on file  Other Topics Concern   Not on file  Social History Narrative   Married one daughter born 7 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 [place] and engaged   Scientist, research (medical), second career, [employer] radiation therapy, employed by [employer] health   Prior smoker occasional alcohol 3 coffees a day no drugs no tobacco now   Worked for [employer] prior to going 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  Physical Exam: Vital signs BP 110/71   Pulse 73   Temp 98 F (36.7 C) (Temporal)   Ht 5' 11"  (1.803 m)   Wt 193 lb (87.5 kg)   SpO2 95%   BMI 26.92 kg/m   General:   Alert,  Well-developed, well-nourished, pleasant and cooperative in NAD Lungs:  Clear throughout to auscultation.   Heart:  Regular rate and rhythm; no murmurs, clicks, rubs,  or gallops. Abdomen:  Soft, nontender and nondistended. Normal bowel sounds.   Neuro/Psych:  Alert and cooperative. Normal mood and affect. A and O x 3   @Blakley Michna  Simonne Maffucci, MD, [facility] [phone] (pager) 06/22/2022 3:45 PM@

## 2022-06-22 NOTE — Patient Instructions (Addendum)
Things look good - I did not see any signs of inflammation. Biopsies taken. There were some polyps removed - I suspect these are benign and post-inflammatory and there were some others seen also. Small external hemorrhoids as well.  I will let you know results and plans.  I appreciate the opportunity to care for you. Gatha Mayer, MD, FACG   YOU HAD AN ENDOSCOPIC PROCEDURE TODAY AT [facility]:   Refer to the procedure report that was given to you for any specific questions about what was found during the examination.  If the procedure report does not answer your questions, please call your gastroenterologist to clarify.  If you requested that your care partner not be given the details of your procedure findings, then the procedure report has been included in a sealed envelope for you to review at your convenience later.  YOU SHOULD EXPECT: Some feelings of bloating in the abdomen. Passage of more gas than usual.  Walking can help get rid of the air that was put into your GI tract during the procedure and reduce the bloating. If you had a lower endoscopy (such as a colonoscopy or flexible sigmoidoscopy) you may notice spotting of blood in your stool or on the toilet paper. If you underwent a bowel prep for your procedure, you may not have a normal bowel movement for a few days.  Please Note:  You might notice some irritation and congestion in your nose or some drainage.  This is from the oxygen used during your procedure.  There is no need for concern and it should clear up in a day or so.  SYMPTOMS TO REPORT IMMEDIATELY:  Following lower endoscopy (colonoscopy or flexible sigmoidoscopy):  Excessive amounts of blood in the stool  Significant tenderness or worsening of abdominal pains  Swelling of the abdomen that is new, acute  Fever of 100F or higher  For urgent or emergent issues, a gastroenterologist can be reached at any hour by calling [phone]. Do not use  MyChart messaging for urgent concerns.    DIET:  We do recommend a small meal at first, but then you may proceed to your regular diet.  Drink plenty of fluids but you should avoid alcoholic beverages for 24 hours.  ACTIVITY:  You should plan to take it easy for the rest of today and you should NOT DRIVE or use heavy machinery until tomorrow (because of the sedation medicines used during the test).    FOLLOW UP: Our staff will call the number listed on your records the next business day following your procedure.  We will call around 7:15- 8:00 am to check on you and address any questions or concerns that you may have regarding the information given to you following your procedure. If we do not reach you, we will leave a message.  If you develop any symptoms (ie: fever, flu-like symptoms, shortness of breath, cough etc.) before then, please call [phone].  If you test positive for Covid 19 in the 2 weeks post procedure, please call and report this information to [place].    If any biopsies were taken you will be contacted by phone or by letter within the next 1-3 weeks.  Please call us at [phone] if you have not heard about the biopsies in 3 weeks.    SIGNATURES/CONFIDENTIALITY: You and/or your care partner have signed paperwork which will be entered into your electronic medical record.  These signatures attest to the fact that that  the information above on your After Visit Summary has been reviewed and is understood.  Full responsibility of the confidentiality of this discharge information lies with you and/or your care-partner.

## 2022-06-22 NOTE — Progress Notes (Signed)
Called to room to assist during endoscopic procedure.  Patient ID and intended procedure confirmed with present staff. Received instructions for my participation in the procedure from the performing physician.  

## 2022-06-23 ENCOUNTER — Telehealth: Payer: Self-pay

## 2022-06-23 ENCOUNTER — Other Ambulatory Visit (HOSPITAL_BASED_OUTPATIENT_CLINIC_OR_DEPARTMENT_OTHER): Payer: Self-pay

## 2022-06-23 ENCOUNTER — Other Ambulatory Visit: Payer: Self-pay | Admitting: Family Medicine

## 2022-06-23 DIAGNOSIS — Z794 Long term (current) use of insulin: Secondary | ICD-10-CM

## 2022-06-23 MED ORDER — BASAGLAR KWIKPEN 100 UNIT/ML ~~LOC~~ SOPN
PEN_INJECTOR | SUBCUTANEOUS | 3 refills | Status: DC
  Filled 2022-06-23: qty 27, 90d supply, fill #0
  Filled 2022-09-20: qty 27, 90d supply, fill #1
  Filled 2022-12-19: qty 27, 90d supply, fill #2

## 2022-06-23 NOTE — Telephone Encounter (Signed)
Follow up call placed, VM obtained and message left. ?SChaplin, RN,BSN ? ?

## 2022-06-26 ENCOUNTER — Ambulatory Visit (INDEPENDENT_AMBULATORY_CARE_PROVIDER_SITE_OTHER): Payer: 59

## 2022-06-26 VITALS — BP 118/75 | HR 77 | Temp 98.3°F | Resp 18 | Ht 71.0 in | Wt 192.4 lb

## 2022-06-26 DIAGNOSIS — K50818 Crohn's disease of both small and large intestine with other complication: Secondary | ICD-10-CM

## 2022-06-26 MED ORDER — VEDOLIZUMAB 300 MG IV SOLR
300.0000 mg | Freq: Once | INTRAVENOUS | Status: AC
  Administered 2022-06-26: 300 mg via INTRAVENOUS
  Filled 2022-06-26: qty 5

## 2022-06-26 NOTE — Progress Notes (Signed)
Diagnosis: Crohn's Disease  Provider:  Marshell Garfinkel, MD  Procedure: Infusion  IV Type: Peripheral, IV Location: L Antecubital  Entyvio (Vedolizumab), Dose: 300 mg  Infusion Start Time: 6886  Infusion Stop Time: 4847  Post Infusion IV Care: Peripheral IV Discontinued  Discharge: Condition: Good, Destination: Home . AVS Declined  Performed by:  Koren Shiver, RN

## 2022-06-27 NOTE — Progress Notes (Unsigned)
[place] [facility] [address], [place], [place] [address] [phone]  Date:  06/28/2022   Name:  Thomas Peterson   DOB:  [birth year]   MRN:  [id number]  PCP:  Thomas Mclean, MD    Chief Complaint: No chief complaint on file.   History of Present Illness:  Thomas Peterson is a 61 y.o. very pleasant male patient who presents with the following:  Patient seen today for physical exam Last visit with myself was in January History of Crohn's disease, diabetes, hyperlipidemia, hypertension, skin cancer-melanoma  From our last visit:  He works as a Scientist, research (medical) with [employer]- they are getting ready for a new rad onc center near [place], he is assisting in the plan progress He is trying to cut down to working 4 days a week-admits his schedule is pretty tiring! His family is well- his daughter is engaged, they have not yet set a date.  She and her fianc met in [place] where they are both living and working for now    Crohn's disease is managed by Dr. 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 We got some lab work for him in May-A1c mildly elevated, discussed possibly starting a GLP-1 but he want to give things a bit more time Would like to recheck PSA in 6 months Lab Results  Component Value Date   HGBA1C 7.5 (H) 04/28/2022    Patient Active Problem List   Diagnosis Date Noted   Long-term use of immunosuppressant medication- Entyvio 09/17/2018   Low back pain 06/04/2018   Controlled type 2 diabetes mellitus with complication,  with long-term current use of insulin ([place]) 01/29/2017   Essential hypertension 01/29/2017   Crohn's ileocolitis ([place]) 01/29/2017   Mixed hyperlipidemia 01/29/2017   Leg cramps 01/29/2017   History of skin cancer 01/29/2017    Past Medical History:  Diagnosis Date   Arthritis 2020   left hand   Crohn's disease without complication ([place]) 89/37/3428   Diabetes mellitus without complication (HCC)    Dyslipidemia    Glaucoma    bilateral   History of basal cell carcinoma    History of melanoma    History of mood disorder    Hyperlipidemia    Hypertension     Past Surgical History:  Procedure Laterality Date   COLONOSCOPY     KNEE ARTHROSCOPY Right [place]   and squamous and basal cell excisions   TONSILLECTOMY  1965    Social History   Tobacco Use   Smoking status: Former    Types: Cigarettes    Quit date: 1995    Years since quitting: 28.5   Smokeless tobacco: Never  Vaping Use   Vaping Use: Never used  Substance Use Topics   Alcohol use: Yes    Comment: occasional-1-2 per week   Drug use: Never    Family History  Problem Relation Age of Onset   Breast cancer Mother    Heart attack Mother    Cancer Brother  type unknown, mets   Diabetes Brother    Crohn's disease Paternal Uncle    Rheum arthritis Paternal Aunt    Stomach cancer Neg Hx    Colon cancer Neg Hx    Esophageal cancer Neg Hx     Allergies  Allergen Reactions   Sulfa Antibiotics Anaphylaxis    "I got these purple spots everywhere"    Ciprofloxacin    Flagyl [Metronidazole]     GI effects    Metformin And Related     Worsens his crohn disease sx, diarrhea    Statins Other (See Comments)    Leg cramps   Mesalamine Other (See Comments)    Gastrointestinal distress    Medication list has been reviewed and updated.  Current Outpatient Medications on File Prior to Visit  Medication Sig Dispense Refill   amLODipine (NORVASC) 10 MG tablet TAKE 1 TABLET (10 MG  TOTAL) BY MOUTH DAILY. 90 tablet 0   Blood Glucose Monitoring Suppl (FREESTYLE LITE) DEVI Dispense 1 glucose monitoring device.  Use as directed by MD 1 each 0   Boswellia Serrata (BOSWELLIA PO) Take 1 tablet by mouth 2 (two) times daily.     budesonide (ENTOCORT EC) 3 MG 24 hr capsule Take 1 capsule (3 mg total) by mouth daily. 90 capsule 0   celecoxib (CELEBREX) 200 MG capsule Take 1 capsule (200 mg total) by mouth 2 (two) times daily. 60 capsule 1   Cholecalciferol (VITAMIN D3 PO) Take by mouth. 2500 iu daily     dapagliflozin propanediol (FARXIGA) 10 MG TABS tablet TAKE 1 TABLET BY MOUTH ONCE DAILY 90 tablet 3   diclofenac sodium (VOLTAREN) 1 % GEL Apply 2 g topically 4 (four) times daily. Apply to hand joints as needed. Max 16 gm per joint and 32 grams total daily 100 g 2   Evolocumab (REPATHA SURECLICK) 423 MG/ML SOAJ INJECT 140 MG INTO THE SKIN EVERY 14 DAYS 2 mL 6   glucose blood (FREESTYLE LITE) test strip Use to test glucose 1-2 times a day 100 each 12   Insulin Glargine (BASAGLAR KWIKPEN) 100 UNIT/ML INJECT 0.3 MLS (30 UNITS TOTAL) INTO THE SKIN DAILY. 27 mL 3   Insulin Pen Needle (TECHLITE PEN NEEDLES) 32G X 6 MM MISC USE ONCE DAILY. 100 each 1   ipratropium (ATROVENT) 0.03 % nasal spray Place 2 sprays into both nostrils 3 (three) times daily as needed for nasal drainage 30 mL 12   Multiple Vitamin (MULTIVITAMIN) tablet Take 1 tablet by mouth daily.     Omega-3 Fatty Acids (FISH OIL PO) Take 4 g by mouth daily.     pioglitazone (ACTOS) 45 MG tablet TAKE 1 TABLET BY MOUTH ONCE DAILY 90 tablet 1   Probiotic Product (VSL#3 PO) Take by mouth as needed.     sodium chloride 0.9 % SOLN 250 mL with vedolizumab 300 MG SOLR 300 mg Inject 300 mg into the vein every 8 (eight) weeks.     timolol (TIMOPTIC) 0.5 % ophthalmic solution Place 1 drop into both eyes 2 times daily. 10 mL 11   valsartan-hydrochlorothiazide (DIOVAN-HCT) 320-25 MG tablet TAKE 1 TABLET BY MOUTH DAILY. 90 tablet 0    venlafaxine XR (EFFEXOR-XR) 37.5 MG 24 hr capsule TAKE 1 CAPSULE (37.5 MG TOTAL) BY MOUTH DAILY WITH BREAKFAST. 90 capsule 0   No current facility-administered medications on file prior to visit.    Review of Systems:  As per HPI- otherwise negative.   Physical Examination: There were no vitals filed  for this visit. There were no vitals filed for this visit. There is no height or weight on file to calculate BMI. Ideal Body Weight:    GEN: no acute distress. HEENT: Atraumatic, Normocephalic.  Ears and Nose: No external deformity. CV: RRR, No M/G/R. No JVD. No thrill. No extra heart sounds. PULM: CTA B, no wheezes, crackles, rhonchi. No retractions. No resp. distress. No accessory muscle use. ABD: S, NT, ND, +BS. No rebound. No HSM. EXTR: No c/c/e PSYCH: Normally interactive. Conversant.  Foot exam  Assessment and Plan: ***  Signed Lamar Blinks, MD

## 2022-06-27 NOTE — Patient Instructions (Incomplete)
It was great to see you again today I would like to repeat your PSA this fall

## 2022-06-28 ENCOUNTER — Other Ambulatory Visit (HOSPITAL_BASED_OUTPATIENT_CLINIC_OR_DEPARTMENT_OTHER): Payer: Self-pay

## 2022-06-28 ENCOUNTER — Ambulatory Visit (INDEPENDENT_AMBULATORY_CARE_PROVIDER_SITE_OTHER): Payer: 59 | Admitting: Family Medicine

## 2022-06-28 VITALS — BP 118/60 | HR 66 | Temp 97.6°F | Resp 18 | Ht 71.0 in | Wt 194.0 lb

## 2022-06-28 DIAGNOSIS — E118 Type 2 diabetes mellitus with unspecified complications: Secondary | ICD-10-CM

## 2022-06-28 DIAGNOSIS — K50818 Crohn's disease of both small and large intestine with other complication: Secondary | ICD-10-CM

## 2022-06-28 DIAGNOSIS — Z0000 Encounter for general adult medical examination without abnormal findings: Secondary | ICD-10-CM | POA: Diagnosis not present

## 2022-06-28 DIAGNOSIS — Z125 Encounter for screening for malignant neoplasm of prostate: Secondary | ICD-10-CM

## 2022-06-28 DIAGNOSIS — Z85828 Personal history of other malignant neoplasm of skin: Secondary | ICD-10-CM | POA: Diagnosis not present

## 2022-06-28 DIAGNOSIS — T7840XA Allergy, unspecified, initial encounter: Secondary | ICD-10-CM

## 2022-06-28 DIAGNOSIS — Z794 Long term (current) use of insulin: Secondary | ICD-10-CM

## 2022-06-28 DIAGNOSIS — I10 Essential (primary) hypertension: Secondary | ICD-10-CM | POA: Diagnosis not present

## 2022-06-28 DIAGNOSIS — E782 Mixed hyperlipidemia: Secondary | ICD-10-CM | POA: Diagnosis not present

## 2022-06-28 DIAGNOSIS — R0982 Postnasal drip: Secondary | ICD-10-CM

## 2022-06-28 MED ORDER — AMLODIPINE BESYLATE 10 MG PO TABS
ORAL_TABLET | Freq: Every day | ORAL | 3 refills | Status: DC
  Filled 2022-06-28: qty 90, 90d supply, fill #0
  Filled 2022-10-12: qty 90, 90d supply, fill #1
  Filled 2023-01-10: qty 90, 90d supply, fill #2
  Filled 2023-04-09: qty 90, 90d supply, fill #3

## 2022-06-28 MED ORDER — IPRATROPIUM BROMIDE 0.03 % NA SOLN
2.0000 | Freq: Three times a day (TID) | NASAL | 12 refills | Status: DC
  Filled 2022-06-28 – 2022-12-19 (×2): qty 30, 50d supply, fill #0

## 2022-06-28 MED ORDER — DAPAGLIFLOZIN PROPANEDIOL 10 MG PO TABS
10.0000 mg | ORAL_TABLET | Freq: Every day | ORAL | 3 refills | Status: DC
  Filled 2022-06-28: qty 90, fill #0
  Filled 2022-09-20: qty 90, 90d supply, fill #0
  Filled 2022-12-19: qty 90, 90d supply, fill #1
  Filled 2023-03-28: qty 90, 90d supply, fill #2

## 2022-06-28 MED ORDER — VALSARTAN-HYDROCHLOROTHIAZIDE 320-25 MG PO TABS
1.0000 | ORAL_TABLET | Freq: Every day | ORAL | 3 refills | Status: DC
  Filled 2022-06-28: qty 90, 90d supply, fill #0
  Filled 2022-10-12: qty 90, 90d supply, fill #1
  Filled 2023-01-10: qty 90, 90d supply, fill #2
  Filled 2023-04-09: qty 90, 90d supply, fill #3

## 2022-06-29 ENCOUNTER — Other Ambulatory Visit (HOSPITAL_COMMUNITY): Payer: Self-pay

## 2022-06-30 ENCOUNTER — Other Ambulatory Visit (HOSPITAL_BASED_OUTPATIENT_CLINIC_OR_DEPARTMENT_OTHER): Payer: Self-pay

## 2022-07-06 ENCOUNTER — Other Ambulatory Visit (HOSPITAL_BASED_OUTPATIENT_CLINIC_OR_DEPARTMENT_OTHER): Payer: Self-pay

## 2022-07-10 ENCOUNTER — Encounter: Payer: Self-pay | Admitting: Internal Medicine

## 2022-07-11 NOTE — Progress Notes (Signed)
Letter printed and sent to pt via mail:

## 2022-07-16 ENCOUNTER — Other Ambulatory Visit: Payer: Self-pay | Admitting: Family Medicine

## 2022-07-17 ENCOUNTER — Other Ambulatory Visit (HOSPITAL_BASED_OUTPATIENT_CLINIC_OR_DEPARTMENT_OTHER): Payer: Self-pay

## 2022-07-17 MED ORDER — VENLAFAXINE HCL ER 37.5 MG PO CP24
ORAL_CAPSULE | Freq: Every day | ORAL | 0 refills | Status: DC
  Filled 2022-07-17: qty 90, 90d supply, fill #0

## 2022-07-25 ENCOUNTER — Encounter: Payer: Self-pay | Admitting: Internal Medicine

## 2022-07-25 ENCOUNTER — Other Ambulatory Visit: Payer: Self-pay | Admitting: Internal Medicine

## 2022-07-25 DIAGNOSIS — R197 Diarrhea, unspecified: Secondary | ICD-10-CM

## 2022-07-27 ENCOUNTER — Other Ambulatory Visit: Payer: 59

## 2022-07-28 ENCOUNTER — Other Ambulatory Visit: Payer: 59

## 2022-07-28 DIAGNOSIS — R197 Diarrhea, unspecified: Secondary | ICD-10-CM | POA: Diagnosis not present

## 2022-07-30 LAB — GI PROFILE, STOOL, PCR

## 2022-07-30 LAB — CLOSTRIDIUM DIFFICILE BY PCR: Toxigenic C. Difficile by PCR: NEGATIVE

## 2022-07-31 ENCOUNTER — Other Ambulatory Visit: Payer: Self-pay | Admitting: Internal Medicine

## 2022-07-31 ENCOUNTER — Other Ambulatory Visit (HOSPITAL_BASED_OUTPATIENT_CLINIC_OR_DEPARTMENT_OTHER): Payer: Self-pay

## 2022-07-31 MED ORDER — AZITHROMYCIN 500 MG PO TABS
500.0000 mg | ORAL_TABLET | Freq: Every day | ORAL | 0 refills | Status: DC
  Filled 2022-07-31: qty 3, 3d supply, fill #0

## 2022-08-01 LAB — CALPROTECTIN, FECAL: Calprotectin, Fecal: 75 ug/g (ref 0–120)

## 2022-08-08 ENCOUNTER — Ambulatory Visit: Payer: 59 | Admitting: Internal Medicine

## 2022-08-08 ENCOUNTER — Encounter: Payer: Self-pay | Admitting: Internal Medicine

## 2022-08-08 ENCOUNTER — Other Ambulatory Visit (HOSPITAL_BASED_OUTPATIENT_CLINIC_OR_DEPARTMENT_OTHER): Payer: Self-pay

## 2022-08-08 VITALS — BP 110/64 | HR 72 | Temp 98.1°F | Resp 12 | Ht 72.0 in | Wt 189.0 lb

## 2022-08-08 DIAGNOSIS — J300 Vasomotor rhinitis: Secondary | ICD-10-CM

## 2022-08-08 DIAGNOSIS — K9089 Other intestinal malabsorption: Secondary | ICD-10-CM

## 2022-08-08 DIAGNOSIS — K5000 Crohn's disease of small intestine without complications: Secondary | ICD-10-CM

## 2022-08-08 MED ORDER — FAMOTIDINE 20 MG PO TABS
20.0000 mg | ORAL_TABLET | Freq: Two times a day (BID) | ORAL | 3 refills | Status: DC
Start: 2022-08-08 — End: 2023-04-16
  Filled 2022-08-08: qty 60, 30d supply, fill #0
  Filled 2022-09-04: qty 60, 30d supply, fill #1
  Filled 2022-10-12: qty 60, 30d supply, fill #2
  Filled 2022-11-27: qty 60, 30d supply, fill #3

## 2022-08-08 MED ORDER — FLUTICASONE PROPIONATE 50 MCG/ACT NA SUSP
1.0000 | Freq: Every day | NASAL | 5 refills | Status: DC
  Filled 2022-08-08: qty 16, 30d supply, fill #0
  Filled 2022-10-30: qty 16, 30d supply, fill #1
  Filled 2022-11-27: qty 16, 30d supply, fill #2

## 2022-08-08 NOTE — Patient Instructions (Signed)
Nonallergic rhinitis: Well controlled  - Testing today showed skin prick and intradermal testing was negative for all intradermal's -Reflux induced sinus disease may be a culprit and we will go ahead and treat with famotidine 20 mg twice daily for 4 weeks - Start taking: Flonase (fluticasone) one spray per nostril daily for nasal congestion  - You can use an extra dose of the antihistamine, if needed, for breakthrough symptoms.  - Consider nasal saline rinses 1-2 times daily to remove allergens from the nasal cavities as well as help with mucous clearance (this is especially helpful to do before the nasal sprays are given) - We will also refer you to ENT for evaluation of anatomical issues   Food allergy:  - today's skin testing was borderline positive to oat  - please strictly avoid oat for 4 weeks and see if abdominal symptoms improve, if not then reintroduce  - okay to resume eating wheat and other grains   Follow up: 4 weeks   Thank you so much for letting me partake in your care today.  Don't hesitate to reach out if you have any additional concerns!  Roney Marion, MD  [facility] [place] [place]

## 2022-08-08 NOTE — Progress Notes (Signed)
New Patient Note  RE: Thomas Peterson MRN: [id number] DOB: Jun 15, 1961 Date of Office Visit: 08/08/2022  Consult requested by: Darreld Mclean, MD Primary care provider: Darreld Mclean, MD  Chief Complaint: Allergic Reaction and Sinusitis (Continuous. Coughing to point of vomiting and hoarseness. )  History of Present Illness: I had the pleasure of seeing Thomas Peterson for initial evaluation at the [facility] on 08/08/2022. He is a 61 y.o. male, who is referred here by [name], Gay Filler, MD for the evaluation of chronic rhinitis and concern for food allerryg.  History obtained from patient  and  chart review  .  He reports chronic sinus congestion, nasal congestion, coughing, post nasal drip which may worsen in the spring.  Denies any prior sinus surgery or sinus CT. He was on prednisone for Chrohns disease  Mild hyposmia.  Denies history of reflux.   Symptoms will worsen within 5-10 minutes after eating certain foods.  He is unsure which specific foods are the cause, but does think gluten may be a triggers   He has a history of Chrohns disease  He does have persistent lower abdominal pain and fatigue.  He cannot relate symptoms temporarily with eating, but sometimes relieved by a bowel movement.    Assessment and Plan: Thomas Peterson is a 61 y.o. male with: Other specified intestinal malabsorption - Plan: Allergy Test, Celiac Disease Ab Screen w/Rfx  Vasomotor rhinitis - Plan: Allergy Test  Crohn's disease of small intestine without complication ([place]) Plan: Patient Instructions  Nonallergic rhinitis: Well controlled  - Testing today showed skin prick and intradermal testing was negative for all intradermal's -Reflux induced sinus disease may be a culprit and we will go ahead and treat with famotidine 20 mg twice daily for 4 weeks - Start taking: Flonase (fluticasone) one spray per nostril daily for nasal congestion  - You can use an extra dose of the antihistamine, if  needed, for breakthrough symptoms.  - Consider nasal saline rinses 1-2 times daily to remove allergens from the nasal cavities as well as help with mucous clearance (this is especially helpful to do before the nasal sprays are given) - We will also refer you to ENT for evaluation of anatomical issues   Food allergy:  - today's skin testing was borderline positive to oat  - please strictly avoid oat for 4 weeks and see if abdominal symptoms improve, if not then reintroduce  - okay to resume eating wheat and other grains   Follow up: 4 weeks   Thank you so much for letting me partake in your care today.  Don't hesitate to reach out if you have any additional concerns!  Roney Marion, MD  [facility] [place] [place]    No follow-ups on file.  No orders of the defined types were placed in this encounter.  Lab Orders         Celiac Disease Ab Screen w/Rfx      Other allergy screening: Asthma: no Rhino conjunctivitis: yes Food allergy:  concern  Medication allergy: no Hymenoptera allergy: yes Urticaria: no Eczema:no History of recurrent infections suggestive of immunodeficency: no  Diagnostics: Skin Testing: Environmental allergy panel and select foods. Food testing positive/borderline to oat only Environmental l testing skin prick and intradermal's were all negative Results interpreted by myself and discussed with patient/family.  Airborne Adult Perc - 08/08/22 1620     Time Antigen Placed 1620    Allergen Manufacturer Lavella Hammock  Location Back    Number of Test 59    1. Control-Buffer 50% Glycerol Negative    2. Control-Histamine 1 mg/ml 3+    3. Albumin saline Negative    4. LeChee Negative    5. [place] Negative    6. Johnson Negative    7. [place] Blue Negative    8. Meadow Fescue Negative    9. Perennial Rye Negative    10. Sweet Vernal Negative    11. Timothy Negative    12. Cocklebur Negative    13. Burweed Marshelder Negative    14.  Ragweed, short Negative    15. Ragweed, Giant Negative    16. Plantain,  English Negative    17. Lamb's Quarters Negative    18. Sheep Sorrell Negative    19. Rough Pigweed Negative    20. Marsh Elder, Rough Negative    21. Mugwort, Common Negative    22. Ash mix Negative    23. Birch mix Negative    24. Beech American Negative    25. Box, Elder Negative    26. Cedar, red Negative    27. Cottonwood, [place] Negative    28. Elm mix Negative    29. Hickory Negative    30. Maple mix Negative    31. Oak, [place] mix Negative    32. Pecan Pollen Negative    33. Pine mix Negative    34. Sycamore Eastern Negative    35. Laurel, Black Pollen Negative    36. Alternaria alternata Negative    37. Cladosporium Herbarum Negative    38. Aspergillus mix Negative    39. Penicillium mix Negative    40. Bipolaris sorokiniana (Helminthosporium) Negative    41. Drechslera spicifera (Curvularia) Negative    42. Mucor plumbeus Negative    43. Fusarium moniliforme Negative    44. Aureobasidium pullulans (pullulara) Negative    45. Rhizopus oryzae Negative    46. Botrytis cinera Negative    47. Epicoccum nigrum Negative    48. Phoma betae Negative    49. Candida Albicans Negative    50. Trichophyton mentagrophytes Negative    51. Mite, D Farinae  5,000 AU/ml Negative    52. Mite, D Pteronyssinus  5,000 AU/ml Negative    53. Cat Hair 10,000 BAU/ml Negative    54.  Dog Epithelia Negative    55. Mixed Feathers Negative    56. Horse Epithelia Negative    57. Cockroach, German Negative    58. Mouse Negative    59. Tobacco Leaf Negative             Food Perc - 08/08/22 1620       Test Information   Time Antigen Placed 1620    Allergen Manufacturer Lavella Hammock    Location Back    Number of allergen test 10      Food   1. Peanut Negative    2. Soybean food Negative    3. Wheat, whole Negative    4. Sesame Negative    5. Milk, cow Negative    6. Egg White, chicken Negative    7. Casein  Negative    8. Shellfish mix Negative    9. Fish mix Negative    10. Cashew Negative             Intradermal - 08/08/22 1622     Time Antigen Placed 1622    Allergen Manufacturer Other    Location Arm    Number of Test  15    Control Negative    Guatemala Negative    Johnson Negative    7 Grass Negative    Ragweed mix Negative    Weed mix Negative    Tree mix Negative    Mold 1 Negative    Mold 2 Negative    Mold 3 Negative    Mold 4 Negative    Cat Negative    Dog Negative    Cockroach Negative    Mite mix Negative             Food Adult Perc - 08/08/22 1500     Time Antigen Placed 1518    Allergen Manufacturer Lavella Hammock    Location Back    Number of allergen test 5    30. Barley Negative    31. Oat  2+    32. Rye  Negative    33. Hops Negative    34. Rice Negative             Past Medical History: Patient Active Problem List   Diagnosis Date Noted   Long-term use of immunosuppressant medication- Entyvio 09/17/2018   Low back pain 06/04/2018   Controlled type 2 diabetes mellitus with complication, with long-term current use of insulin ([place]) 01/29/2017   Essential hypertension 01/29/2017   Crohn's ileocolitis ([place]) 01/29/2017   Mixed hyperlipidemia 01/29/2017   Leg cramps 01/29/2017   History of skin cancer 01/29/2017   Past Medical History:  Diagnosis Date   Arthritis 2020   left hand   Crohn's disease without complication ([place]) 54/27/0623   Diabetes mellitus type 2 with complications (HCC)    Diabetes mellitus without complication ([place])    Dyslipidemia    Glaucoma    bilateral   History of basal cell carcinoma    History of melanoma    History of mood disorder    Hyperlipidemia    Hypertension    Lower back pain    Past Surgical History: Past Surgical History:  Procedure Laterality Date   COLONOSCOPY     KNEE ARTHROSCOPY Right 1999   MELANOMA EXCISION  1999   and squamous and basal cell excisions   TONSILLECTOMY  1965    Medication List:  Current Outpatient Medications  Medication Sig Dispense Refill   amLODipine (NORVASC) 10 MG tablet TAKE 1 TABLET (10 MG TOTAL) BY MOUTH DAILY. 90 tablet 3   Boswellia Serrata (BOSWELLIA PO) Take 1 tablet by mouth 2 (two) times daily.     budesonide (ENTOCORT EC) 3 MG 24 hr capsule Take 1 capsule (3 mg total) by mouth daily. 90 capsule 0   celecoxib (CELEBREX) 200 MG capsule Take 1 capsule (200 mg total) by mouth 2 (two) times daily. 60 capsule 1   Cholecalciferol (VITAMIN D3 PO) Take by mouth. 2500 iu daily     dapagliflozin propanediol (FARXIGA) 10 MG TABS tablet TAKE 1 TABLET BY MOUTH ONCE DAILY 90 tablet 3   diclofenac sodium (VOLTAREN) 1 % GEL Apply 2 g topically 4 (four) times daily. Apply to hand joints as needed. Max 16 gm per joint and 32 grams total daily 100 g 2   Evolocumab (REPATHA SURECLICK) 762 MG/ML SOAJ INJECT 140 MG INTO THE SKIN EVERY 14 DAYS 2 mL 6   Insulin Glargine (BASAGLAR KWIKPEN) 100 UNIT/ML INJECT 0.3 MLS (30 UNITS TOTAL) INTO THE SKIN DAILY. 27 mL 3   ipratropium (ATROVENT) 0.03 % nasal spray Place 2 sprays into both nostrils 3 (three) times daily as needed for  nasal drainage 30 mL 12   Multiple Vitamin (MULTIVITAMIN) tablet Take 1 tablet by mouth daily.     Omega-3 Fatty Acids (FISH OIL PO) Take 4 g by mouth daily.     pioglitazone (ACTOS) 45 MG tablet TAKE 1 TABLET BY MOUTH ONCE DAILY 90 tablet 1   Probiotic Product (VSL#3 PO) Take by mouth as needed.     sodium chloride 0.9 % SOLN 250 mL with vedolizumab 300 MG SOLR 300 mg Inject 300 mg into the vein every 8 (eight) weeks.     valsartan-hydrochlorothiazide (DIOVAN-HCT) 320-25 MG tablet TAKE 1 TABLET BY MOUTH DAILY. 90 tablet 3   venlafaxine XR (EFFEXOR-XR) 37.5 MG 24 hr capsule TAKE 1 CAPSULE (37.5 MG TOTAL) BY MOUTH DAILY WITH BREAKFAST. 90 capsule 0   No current facility-administered medications for this visit.   Allergies: Allergies  Allergen Reactions   Sulfa Antibiotics Anaphylaxis     "I got these purple spots everywhere"    Ciprofloxacin    Flagyl [Metronidazole]     GI effects    Metformin And Related     Worsens his crohn disease sx, diarrhea    Statins Other (See Comments)    Leg cramps   Timolol     "Burning of the eyes"   Mesalamine Other (See Comments)    Gastrointestinal distress   Social History: Social History   Socioeconomic History   Marital status: Married    Spouse name: Not on file   Number of children: 1   Years of education: Not on file   Highest education level: Not on file  Occupational History   Occupation: Agricultural engineer: [employer]  Tobacco Use   Smoking status: Former    Years: 12.00    Types: Cigarettes    Quit date: 1995    Years since quitting: 28.6    Passive exposure: Never   Smokeless tobacco: Never   Tobacco comments:    Off and on smoker   Vaping Use   Vaping Use: Never used  Substance and Sexual Activity   Alcohol use: Yes    Comment: occasional-1-2 per week   Drug use: Never   Sexual activity: Not on file  Other Topics Concern   Not on file  Social History Narrative   Married one daughter born 19 she lives in [place] working in advertising-now living in [place] and engaged   Scientist, research (medical), second career, [employer] radiation therapy, employed by [employer] health   Prior smoker occasional alcohol 3 coffees a day no drugs no tobacco now   Worked for [employer] prior to going back to school to become a Marketing executive   Social Determinants of Radio broadcast assistant Strain: Not on file  Food Insecurity: Not on file  Transportation Needs: Not on file  Physical Activity: Not on file  Stress: Not on file  Social Connections: Not on file   Lives in a single-family home that is 63 years old.  There are no roaches in the house and bed is 2 feet off the floor.  There are no dust mite precautions on better pillows.  He is not exposed to fumes, chemicals or dust.  There is  a HEPA filter in the home and home is not located near industrial area or interstate. Smoking: Prior smoker, 8-pack-year history quit in 1990s Occupation: Systems analyst works at [employer] History: Water Damage/mildew in the house: no Carpet in the family room:  no Carpet in the bedroom: no Heating: electric Cooling: central Pet: yes cat, dog with access to bedroom  Family History: Family History  Problem Relation Age of Onset   Breast cancer Mother    Heart attack Mother    Eczema Brother    Cancer Brother        type unknown, mets   Diabetes Brother    Rheum arthritis Paternal Aunt    Crohn's disease Paternal Uncle    Food Allergy Daughter    Stomach cancer Neg Hx    Colon cancer Neg Hx    Esophageal cancer Neg Hx    Asthma Neg Hx    Immunodeficiency Neg Hx    Angioedema Neg Hx    Atopy Neg Hx      ROS: All others negative except as noted per HPI.   Objective: BP 110/64   Pulse 72   Temp 98.1 F (36.7 C) (Temporal)   Resp 12   Ht 6' (1.829 m)   Wt 189 lb (85.7 kg)   SpO2 98%   BMI 25.63 kg/m  Body mass index is 25.63 kg/m.  General Appearance:  Alert, cooperative, no distress, appears stated age  Head:  Normocephalic, without obvious abnormality, atraumatic  Eyes:  Conjunctiva clear, EOM's intact  Nose: Nares normal, hypertrophic turbinates, no visible anterior polyps, and septum midline  Throat: Lips, tongue normal; teeth and gums normal, no tonsillar exudate and + cobblestoning  Neck: Supple, symmetrical  Lungs:   clear to auscultation bilaterally, Respirations unlabored, no coughing  Heart:  regular rate and rhythm and no murmur, Appears well perfused  Extremities: No edema  Skin: Skin color, texture, turgor normal, no rashes or lesions on visualized portions of skin  Neurologic: No gross deficits   The plan was reviewed with the patient/family, and all questions/concerned were addressed.  It was my pleasure to see Thomas Peterson today and  participate in his care. Please feel free to contact me with any questions or concerns.  Sincerely,  Roney Marion, MD Allergy & Immunology  [facility] office: [phone] [facility] office: [phone]

## 2022-08-08 NOTE — Addendum Note (Signed)
Addended by: Katherina Right D on: 08/08/2022 05:07 PM   Modules accepted: Orders

## 2022-08-09 DIAGNOSIS — K9089 Other intestinal malabsorption: Secondary | ICD-10-CM | POA: Diagnosis not present

## 2022-08-13 LAB — CELIAC DISEASE AB SCREEN W/RFX
Antigliadin Abs, IgA: 6 units (ref 0–19)
IgA/Immunoglobulin A, Serum: 334 mg/dL (ref 61–437)
Transglutaminase IgA: <2 U/mL (ref 0–3)

## 2022-08-15 NOTE — Progress Notes (Signed)
Celiac panel testing was negative for celiac disease.  Can someone let patient know?  Thanks!

## 2022-08-16 ENCOUNTER — Encounter: Payer: Self-pay | Admitting: Internal Medicine

## 2022-08-18 ENCOUNTER — Encounter: Payer: 59 | Admitting: Family Medicine

## 2022-08-18 ENCOUNTER — Telehealth: Payer: Self-pay | Admitting: Family Medicine

## 2022-08-18 ENCOUNTER — Encounter: Payer: Self-pay | Admitting: Family Medicine

## 2022-08-18 ENCOUNTER — Other Ambulatory Visit (HOSPITAL_BASED_OUTPATIENT_CLINIC_OR_DEPARTMENT_OTHER): Payer: Self-pay

## 2022-08-18 DIAGNOSIS — H6504 Acute serous otitis media, recurrent, right ear: Secondary | ICD-10-CM

## 2022-08-18 MED ORDER — AMOXICILLIN 500 MG PO CAPS
1000.0000 mg | ORAL_CAPSULE | Freq: Two times a day (BID) | ORAL | 0 refills | Status: DC
  Filled 2022-08-18: qty 40, 10d supply, fill #0

## 2022-08-18 NOTE — Telephone Encounter (Signed)
Patient called earlier stating he needed medication for his ear, advised patient that an OV would be needed to which he said he could do a virtual. Advised Patient that he might need to be examined to which a virtual might not work, but the patient said that it was an ongoing issue and a virtual would be fine. He was scheduled to see Dr. Gena Fray at [facility] for a virtual. Patient called back after 3pm stating that Dr. Gena Fray refused to give him anything because he would need to be examined, to which the patient was not happy about. Patient stated he wanted a message sent to Dr. Lorelei Pont so antibiotics could be sent. Advised patient to set up appointment but he refused and asked for a message to be sent. Please advise.

## 2022-08-18 NOTE — Telephone Encounter (Signed)
Gave patient a call back.  He notes he has been having intermittent/recurrent issues particularly with his right ear.  He has been to see an allergist for this but she did not find anything that she could treat.  They discussed sending him to see an ENT for presumed recurrent ear infections.  About 2 days ago he noted pain in his right ear again, hearing is a bit muffled.  It does not hurt if he moves the pinna.  No drainage from the ear, no other symptoms such as fever, no chest pain or shortness of breath  We agreed to start him on amoxicillin twice daily, I placed an ENT referral.  He will let me know if not improving over the weekend, we can see him on Monday if need be

## 2022-08-18 NOTE — Progress Notes (Unsigned)
Mr. Kyser had scheduled a video visit for evaluation of an ear infection. I had my medical assistant contact him about coming in for an in-person visit so that I could perform an appropriate examination of his ear. He noted that this was an ongoing issue and that he wanted an antibiotic prescription until he can get in to see an ENT. He also noted he would be unable to come in for an in-person evaluation. I reviewed his record and could find no documentation of any ongoing ear infection issues in the past several years. The MA explained on my behalf that I would be unable to prescribe an antibiotic without an appropriate examination. He decided not to follow through with the video visit and noted he would reach out to his PCP.   Haydee Salter, MD

## 2022-08-18 NOTE — Progress Notes (Deleted)
Thomas Peterson had scheduled a video visit for evaluation of an ear infection. I had my medical assistant contact him about coming in for an in-person visit so that I could perform an appropriate examination of his ear. He noted that this was an ongoing issue and that he wanted an antibiotic prescription until he can get in to see an ENT. He also noted he would be unable to come in for an in-person evaluation. I reviewed his record and could find no documentation of any ongoing ear infection issues in the past several years. The MA explained on my behalf that I would be unable to prescribe an antibiotic without an appropriate examination. He decided not to follow through with the video visit and noted he would reach out to his PCP.  Haydee Salter, MD

## 2022-08-18 NOTE — Telephone Encounter (Signed)
See below

## 2022-08-20 ENCOUNTER — Other Ambulatory Visit: Payer: Self-pay | Admitting: Family Medicine

## 2022-08-20 DIAGNOSIS — Z794 Long term (current) use of insulin: Secondary | ICD-10-CM

## 2022-08-20 DIAGNOSIS — E118 Type 2 diabetes mellitus with unspecified complications: Secondary | ICD-10-CM

## 2022-08-21 ENCOUNTER — Other Ambulatory Visit: Payer: Self-pay | Admitting: Family Medicine

## 2022-08-21 ENCOUNTER — Other Ambulatory Visit (HOSPITAL_BASED_OUTPATIENT_CLINIC_OR_DEPARTMENT_OTHER): Payer: Self-pay

## 2022-08-21 ENCOUNTER — Ambulatory Visit (INDEPENDENT_AMBULATORY_CARE_PROVIDER_SITE_OTHER): Payer: 59

## 2022-08-21 VITALS — BP 118/79 | HR 67 | Temp 98.4°F | Resp 18 | Ht 71.0 in | Wt 188.4 lb

## 2022-08-21 DIAGNOSIS — K50818 Crohn's disease of both small and large intestine with other complication: Secondary | ICD-10-CM

## 2022-08-21 MED ORDER — PIOGLITAZONE HCL 45 MG PO TABS
45.0000 mg | ORAL_TABLET | Freq: Every day | ORAL | 1 refills | Status: DC
  Filled 2022-08-21: qty 90, 90d supply, fill #0
  Filled 2022-11-27: qty 90, 90d supply, fill #1

## 2022-08-21 MED ORDER — VEDOLIZUMAB 300 MG IV SOLR
300.0000 mg | Freq: Once | INTRAVENOUS | Status: AC
  Administered 2022-08-21: 300 mg via INTRAVENOUS
  Filled 2022-08-21: qty 5

## 2022-08-21 MED ORDER — TECHLITE PEN NEEDLES 32G X 6 MM MISC
1 refills | Status: DC
  Filled 2022-08-21: qty 100, 90d supply, fill #0
  Filled 2022-11-27: qty 100, 90d supply, fill #1

## 2022-08-21 NOTE — Progress Notes (Signed)
Diagnosis: Crohn's Disease  Provider:  Marshell Garfinkel MD  Procedure: Infusion  IV Type: Peripheral, IV Location: L Antecubital  Entyvio (Vedolizumab), Dose: 300 mg  Infusion Start Time: 8257  Infusion Stop Time: 4935  Post Infusion IV Care: Peripheral IV Discontinued  Discharge: Condition: Good, Destination: Home . AVS provided to patient.   Performed by:  Cleophus Molt, RN

## 2022-08-21 NOTE — Progress Notes (Signed)
This encounter was created in error - please disregard.

## 2022-08-25 ENCOUNTER — Encounter: Payer: Self-pay | Admitting: Family Medicine

## 2022-08-25 DIAGNOSIS — H33321 Round hole, right eye: Secondary | ICD-10-CM | POA: Diagnosis not present

## 2022-08-25 DIAGNOSIS — H43813 Vitreous degeneration, bilateral: Secondary | ICD-10-CM | POA: Diagnosis not present

## 2022-08-25 DIAGNOSIS — H35362 Drusen (degenerative) of macula, left eye: Secondary | ICD-10-CM | POA: Diagnosis not present

## 2022-08-25 DIAGNOSIS — H25043 Posterior subcapsular polar age-related cataract, bilateral: Secondary | ICD-10-CM | POA: Diagnosis not present

## 2022-08-25 DIAGNOSIS — H40053 Ocular hypertension, bilateral: Secondary | ICD-10-CM | POA: Diagnosis not present

## 2022-08-25 DIAGNOSIS — H2513 Age-related nuclear cataract, bilateral: Secondary | ICD-10-CM | POA: Diagnosis not present

## 2022-08-25 DIAGNOSIS — H52203 Unspecified astigmatism, bilateral: Secondary | ICD-10-CM | POA: Diagnosis not present

## 2022-08-25 DIAGNOSIS — H5213 Myopia, bilateral: Secondary | ICD-10-CM | POA: Diagnosis not present

## 2022-08-25 DIAGNOSIS — Z83518 Family history of other specified eye disorder: Secondary | ICD-10-CM | POA: Diagnosis not present

## 2022-09-04 ENCOUNTER — Other Ambulatory Visit (HOSPITAL_BASED_OUTPATIENT_CLINIC_OR_DEPARTMENT_OTHER): Payer: Self-pay

## 2022-09-04 MED ORDER — TIMOLOL MALEATE 0.5 % OP SOLN
OPHTHALMIC | 11 refills | Status: DC
  Filled 2022-09-04: qty 10, 50d supply, fill #0
  Filled 2022-10-12: qty 10, 50d supply, fill #1
  Filled 2022-12-01: qty 10, 50d supply, fill #2
  Filled 2023-02-06: qty 10, 50d supply, fill #3
  Filled 2023-03-28: qty 10, 50d supply, fill #4
  Filled 2023-05-17: qty 10, 50d supply, fill #5
  Filled 2023-07-12: qty 10, 50d supply, fill #6

## 2022-09-05 ENCOUNTER — Encounter: Payer: Self-pay | Admitting: Internal Medicine

## 2022-09-05 ENCOUNTER — Ambulatory Visit: Payer: 59 | Admitting: Internal Medicine

## 2022-09-05 ENCOUNTER — Ambulatory Visit (INDEPENDENT_AMBULATORY_CARE_PROVIDER_SITE_OTHER): Payer: 59 | Admitting: Internal Medicine

## 2022-09-05 VITALS — BP 120/74 | HR 80 | Temp 98.1°F | Resp 17 | Wt 189.4 lb

## 2022-09-05 DIAGNOSIS — J310 Chronic rhinitis: Secondary | ICD-10-CM | POA: Diagnosis not present

## 2022-09-05 DIAGNOSIS — H6981 Other specified disorders of Eustachian tube, right ear: Secondary | ICD-10-CM | POA: Diagnosis not present

## 2022-09-05 NOTE — Progress Notes (Signed)
Follow Up Note  RE: Riggs Done MRN: [id number] DOB: 1961/11/26 Date of Office Visit: 09/05/2022  Referring provider: Darreld Mclean, MD Primary care provider: Darreld Mclean, MD  Chief Complaint: Follow-up (Pt states he recurrently had an ear infection and was treated with amoxicillin and pseudofed but ear is painful. Appt with ENT is schedule for 09/19/22.)  History of Present Illness: I had the pleasure of seeing Chuckie Tortorelli for a follow up visit at the [facility] on 09/05/2022. He is a 61 y.o. male, who is being followed for nonallergic rhinitis and abdominal pain . His previous allergy office visit was on 08/08/22 with Dr. Edison Pace. Today is a regular follow up visit.  History obtained from patient, chart review.  Since last visit he developed ear fullness in right ear. He was prescribed amoxicillin and pseudophed  by PCP which has helped 90%.  Still feel lke he has some ear fullness in right.  Also reports increased ear wax in left.  Does feel like since he started famotidine he has had decreased cough.  He also saw gastroenterology who did a GI panel is positive for E. coli.  History of azithromycin abdominal discomfort resolved.  Is not fully removed from his diet  Assessment and Plan: Jeriel is a 61 y.o. male with: Nonallergic rhinitis  Eustachian tube dysfunction, right Plan: Patient Instructions  Nonallergic rhinitis:  somewhat improved  - 08/08/22:  skin prick and intradermal testing was negative for all environmentals   - Continue taking: Flonase (fluticasone) one spray per nostril daily for nasal congestion  - Continue famotidine 65m twice a day  - Start mucinex 603mtwice a day  - Okay to take pseudophedrine 2 tabs in morning  - He has an appointment with ENT 09/19/22.     Follow up:  as needed   Thank you so much for letting me partake in your care today.  Don't hesitate to reach out if you have any additional concerns!  EvRoney MarionMD  [facility] [place] [place]   No follow-ups on file.  No orders of the defined types were placed in this encounter.   Lab Orders  No laboratory test(s) ordered today   Diagnostics: None done  Results interpreted by myself during this encounter and discussed with patient/family.   Medication List:  Current Outpatient Medications  Medication Sig Dispense Refill   amLODipine (NORVASC) 10 MG tablet TAKE 1 TABLET (10 MG TOTAL) BY MOUTH DAILY. 90 tablet 3   Boswellia Serrata (BOSWELLIA PO) Take 1 tablet by mouth 2 (two) times daily.     budesonide (ENTOCORT EC) 3 MG 24 hr capsule Take 1 capsule (3 mg total) by mouth daily. 90 capsule 0   celecoxib (CELEBREX) 200 MG capsule Take 1 capsule (200 mg total) by mouth 2 (two) times daily. 60 capsule 1   Cholecalciferol (VITAMIN D3 PO) Take by mouth. 2500 iu daily     dapagliflozin propanediol (FARXIGA) 10 MG TABS tablet TAKE 1 TABLET BY MOUTH ONCE DAILY 90 tablet 3   diclofenac sodium (VOLTAREN) 1 % GEL Apply 2 g topically 4 (four) times daily. Apply to hand joints as needed. Max 16 gm per joint and 32 grams total daily 100 g 2   Evolocumab (REPATHA SURECLICK) 14945G/ML SOAJ INJECT 140 MG INTO THE SKIN EVERY 14 DAYS 2 mL 6   famotidine (PEPCID) 20 MG tablet Take 1 tablet (20 mg total) by mouth 2 (two) times  daily. 60 tablet 3   fluticasone (FLONASE) 50 MCG/ACT nasal spray Place 1 spray into both nostrils daily. 16 g 5   Insulin Glargine (BASAGLAR KWIKPEN) 100 UNIT/ML INJECT 0.3 MLS (30 UNITS TOTAL) INTO THE SKIN DAILY. 27 mL 3   Insulin Pen Needle (TECHLITE PEN NEEDLES) 32G X 6 MM MISC USE ONCE DAILY. 100 each 1   ipratropium (ATROVENT) 0.03 % nasal spray Place 2 sprays into both nostrils 3 (three) times daily as needed for nasal drainage 30 mL 12   Multiple Vitamin (MULTIVITAMIN) tablet Take 1 tablet by mouth daily.     Omega-3 Fatty Acids (FISH OIL PO) Take 4 g by mouth daily.     pioglitazone (ACTOS) 45 MG tablet  Take 1 tablet (45 mg total) by mouth daily. 90 tablet 1   Probiotic Product (VSL#3 PO) Take by mouth as needed.     sodium chloride 0.9 % SOLN 250 mL with vedolizumab 300 MG SOLR 300 mg Inject 300 mg into the vein every 8 (eight) weeks.     timolol (TIMOPTIC) 0.5 % ophthalmic solution Place 1 drop into both eyes 2 times daily. 10 mL 11   valsartan-hydrochlorothiazide (DIOVAN-HCT) 320-25 MG tablet TAKE 1 TABLET BY MOUTH DAILY. 90 tablet 3   venlafaxine XR (EFFEXOR-XR) 37.5 MG 24 hr capsule TAKE 1 CAPSULE (37.5 MG TOTAL) BY MOUTH DAILY WITH BREAKFAST. 90 capsule 0   amoxicillin (AMOXIL) 500 MG capsule Take 2 capsules (1,000 mg total) by mouth 2 (two) times daily. (Patient not taking: Reported on 09/05/2022) 40 capsule 0   No current facility-administered medications for this visit.   Allergies: Allergies  Allergen Reactions   Sulfa Antibiotics Anaphylaxis    "I got these purple spots everywhere"    Ciprofloxacin    Flagyl [Metronidazole]     GI effects    Metformin And Related     Worsens his crohn disease sx, diarrhea    Statins Other (See Comments)    Leg cramps   Timolol     "Burning of the eyes"   Mesalamine Other (See Comments)    Gastrointestinal distress   I reviewed his past medical history, social history, family history, and environmental history and no significant changes have been reported from his previous visit.  ROS: All others negative except as noted per HPI.   Objective: BP 120/74   Pulse 80   Temp 98.1 F (36.7 C) (Temporal)   Resp 17   Wt 189 lb 6.4 oz (85.9 kg)   SpO2 97%   BMI 26.42 kg/m  Body mass index is 26.42 kg/m. General Appearance:  Alert, cooperative, no distress, appears stated age  Head:  Normocephalic, without obvious abnormality, atraumatic  Eyes:  Conjunctiva clear, EOM's intact  Nose: Nares normal, hypertrophic turbinates, no visible anterior polyps, and septum midline  Throat: Lips, tongue normal; teeth and gums normal, normal posterior  oropharynx  Neck: Supple, symmetrical  Lungs:   clear to auscultation bilaterally, Respirations unlabored, no coughing  Heart:  regular rate and rhythm and no murmur, Appears well perfused  Extremities: No edema  Skin: Skin color, texture, turgor normal, no rashes or lesions on visualized portions of skin   Neurologic: No gross deficits   Previous notes and tests were reviewed. The plan was reviewed with the patient/family, and all questions/concerned were addressed.  It was my pleasure to see Winnie today and participate in his care. Please feel free to contact me with any questions or concerns.  Sincerely,  Roney Marion,  MD  Allergy & Immunology  [facility] Office: [phone]

## 2022-09-05 NOTE — Patient Instructions (Addendum)
Nonallergic rhinitis:  somewhat improved  - 08/08/22:  skin prick and intradermal testing was negative for all environmentals   - Continue taking: Flonase (fluticasone) one spray per nostril daily for nasal congestion  - Continue famotidine 67m twice a day  - Start mucinex 6015mtwice a day  - Okay to take pseudophedrine 2 tabs in morning  - He has an appointment with ENT 09/19/22.     Follow up:  as needed   Thank you so much for letting me partake in your care today.  Don't hesitate to reach out if you have any additional concerns!  EvRoney MarionMD  [facility]

## 2022-09-13 DIAGNOSIS — L821 Other seborrheic keratosis: Secondary | ICD-10-CM | POA: Diagnosis not present

## 2022-09-13 DIAGNOSIS — D2272 Melanocytic nevi of left lower limb, including hip: Secondary | ICD-10-CM | POA: Diagnosis not present

## 2022-09-13 DIAGNOSIS — L814 Other melanin hyperpigmentation: Secondary | ICD-10-CM | POA: Diagnosis not present

## 2022-09-13 DIAGNOSIS — D225 Melanocytic nevi of trunk: Secondary | ICD-10-CM | POA: Diagnosis not present

## 2022-09-13 DIAGNOSIS — Z85820 Personal history of malignant melanoma of skin: Secondary | ICD-10-CM | POA: Diagnosis not present

## 2022-09-13 DIAGNOSIS — D1801 Hemangioma of skin and subcutaneous tissue: Secondary | ICD-10-CM | POA: Diagnosis not present

## 2022-09-13 DIAGNOSIS — Z85828 Personal history of other malignant neoplasm of skin: Secondary | ICD-10-CM | POA: Diagnosis not present

## 2022-09-20 ENCOUNTER — Other Ambulatory Visit (INDEPENDENT_AMBULATORY_CARE_PROVIDER_SITE_OTHER): Payer: 59

## 2022-09-20 ENCOUNTER — Encounter: Payer: Self-pay | Admitting: Family Medicine

## 2022-09-20 ENCOUNTER — Other Ambulatory Visit (HOSPITAL_BASED_OUTPATIENT_CLINIC_OR_DEPARTMENT_OTHER): Payer: Self-pay

## 2022-09-20 DIAGNOSIS — E118 Type 2 diabetes mellitus with unspecified complications: Secondary | ICD-10-CM | POA: Diagnosis not present

## 2022-09-20 DIAGNOSIS — Z794 Long term (current) use of insulin: Secondary | ICD-10-CM | POA: Diagnosis not present

## 2022-09-20 DIAGNOSIS — Z125 Encounter for screening for malignant neoplasm of prostate: Secondary | ICD-10-CM | POA: Diagnosis not present

## 2022-09-20 LAB — PSA: PSA: 1.19 ng/mL (ref 0.10–4.00)

## 2022-09-20 LAB — HEMOGLOBIN A1C: Hgb A1c MFr Bld: 7.5 % — ABNORMAL HIGH (ref 4.6–6.5)

## 2022-10-12 ENCOUNTER — Other Ambulatory Visit: Payer: Self-pay | Admitting: Internal Medicine

## 2022-10-12 ENCOUNTER — Encounter: Payer: Self-pay | Admitting: Internal Medicine

## 2022-10-12 ENCOUNTER — Other Ambulatory Visit (HOSPITAL_BASED_OUTPATIENT_CLINIC_OR_DEPARTMENT_OTHER): Payer: Self-pay

## 2022-10-12 ENCOUNTER — Other Ambulatory Visit: Payer: Self-pay | Admitting: Family Medicine

## 2022-10-12 MED ORDER — VENLAFAXINE HCL ER 37.5 MG PO CP24
37.5000 mg | ORAL_CAPSULE | Freq: Every day | ORAL | 3 refills | Status: DC
  Filled 2022-10-12: qty 90, 90d supply, fill #0
  Filled 2023-01-10: qty 90, 90d supply, fill #1
  Filled 2023-04-09: qty 90, 90d supply, fill #2

## 2022-10-12 MED ORDER — BUDESONIDE 3 MG PO CPEP
3.0000 mg | ORAL_CAPSULE | Freq: Every day | ORAL | 1 refills | Status: DC
  Filled 2022-10-12: qty 90, 90d supply, fill #0
  Filled 2023-04-25: qty 90, 90d supply, fill #1

## 2022-10-12 MED ORDER — CELECOXIB 200 MG PO CAPS
200.0000 mg | ORAL_CAPSULE | Freq: Two times a day (BID) | ORAL | 1 refills | Status: DC
  Filled 2022-10-12: qty 60, 30d supply, fill #0
  Filled 2022-12-19: qty 60, 30d supply, fill #1

## 2022-10-13 ENCOUNTER — Other Ambulatory Visit (HOSPITAL_BASED_OUTPATIENT_CLINIC_OR_DEPARTMENT_OTHER): Payer: Self-pay

## 2022-10-16 ENCOUNTER — Ambulatory Visit (INDEPENDENT_AMBULATORY_CARE_PROVIDER_SITE_OTHER): Payer: 59

## 2022-10-16 VITALS — BP 123/76 | HR 77 | Temp 97.6°F | Resp 18 | Ht 66.0 in | Wt 189.8 lb

## 2022-10-16 DIAGNOSIS — K50818 Crohn's disease of both small and large intestine with other complication: Secondary | ICD-10-CM | POA: Diagnosis not present

## 2022-10-16 MED ORDER — VEDOLIZUMAB 300 MG IV SOLR
300.0000 mg | Freq: Once | INTRAVENOUS | Status: AC
  Administered 2022-10-16: 300 mg via INTRAVENOUS
  Filled 2022-10-16: qty 5

## 2022-10-16 NOTE — Progress Notes (Signed)
Diagnosis: Crohn's Disease  Provider:  Marshell Garfinkel MD  Procedure: Infusion  IV Type: Peripheral, IV Location: L Antecubital  Entyvio (Vedolizumab), Dose: 300 mg  Infusion Start Time: 9038  Infusion Stop Time: 3338  Post Infusion IV Care: Peripheral IV Discontinued  Discharge: Condition: Good, Destination: Home . AVS Declined  Performed by:  Arnoldo Morale, RN

## 2022-10-30 ENCOUNTER — Other Ambulatory Visit (HOSPITAL_BASED_OUTPATIENT_CLINIC_OR_DEPARTMENT_OTHER): Payer: Self-pay

## 2022-11-14 ENCOUNTER — Encounter: Payer: Self-pay | Admitting: Family Medicine

## 2022-11-21 DIAGNOSIS — Z85820 Personal history of malignant melanoma of skin: Secondary | ICD-10-CM | POA: Diagnosis not present

## 2022-11-21 DIAGNOSIS — D225 Melanocytic nevi of trunk: Secondary | ICD-10-CM | POA: Diagnosis not present

## 2022-11-21 DIAGNOSIS — Z85828 Personal history of other malignant neoplasm of skin: Secondary | ICD-10-CM | POA: Diagnosis not present

## 2022-11-21 DIAGNOSIS — D1801 Hemangioma of skin and subcutaneous tissue: Secondary | ICD-10-CM | POA: Diagnosis not present

## 2022-11-21 DIAGNOSIS — L821 Other seborrheic keratosis: Secondary | ICD-10-CM | POA: Diagnosis not present

## 2022-11-21 DIAGNOSIS — L820 Inflamed seborrheic keratosis: Secondary | ICD-10-CM | POA: Diagnosis not present

## 2022-11-23 ENCOUNTER — Encounter: Payer: Self-pay | Admitting: Internal Medicine

## 2022-11-25 ENCOUNTER — Encounter: Payer: Self-pay | Admitting: Internal Medicine

## 2022-11-27 ENCOUNTER — Other Ambulatory Visit (HOSPITAL_BASED_OUTPATIENT_CLINIC_OR_DEPARTMENT_OTHER): Payer: Self-pay

## 2022-11-27 ENCOUNTER — Other Ambulatory Visit: Payer: Self-pay | Admitting: Family Medicine

## 2022-11-27 MED ORDER — REPATHA SURECLICK 140 MG/ML ~~LOC~~ SOAJ
140.0000 mg | SUBCUTANEOUS | 6 refills | Status: DC
  Filled 2022-11-27: qty 2, 28d supply, fill #0
  Filled 2023-01-10: qty 2, 28d supply, fill #1
  Filled 2023-02-19: qty 2, 28d supply, fill #2
  Filled 2023-04-09: qty 2, 28d supply, fill #3
  Filled 2023-06-14: qty 2, 28d supply, fill #4
  Filled 2023-07-12: qty 2, 28d supply, fill #5
  Filled 2023-09-12: qty 2, 28d supply, fill #6

## 2022-12-01 ENCOUNTER — Other Ambulatory Visit (HOSPITAL_BASED_OUTPATIENT_CLINIC_OR_DEPARTMENT_OTHER): Payer: Self-pay

## 2022-12-05 ENCOUNTER — Other Ambulatory Visit (HOSPITAL_BASED_OUTPATIENT_CLINIC_OR_DEPARTMENT_OTHER): Payer: Self-pay

## 2022-12-12 ENCOUNTER — Other Ambulatory Visit (HOSPITAL_BASED_OUTPATIENT_CLINIC_OR_DEPARTMENT_OTHER): Payer: Self-pay

## 2022-12-12 ENCOUNTER — Ambulatory Visit (INDEPENDENT_AMBULATORY_CARE_PROVIDER_SITE_OTHER): Payer: Commercial Managed Care - PPO

## 2022-12-12 VITALS — BP 122/81 | HR 65 | Temp 97.8°F | Resp 16 | Wt 193.8 lb

## 2022-12-12 DIAGNOSIS — K50818 Crohn's disease of both small and large intestine with other complication: Secondary | ICD-10-CM

## 2022-12-12 DIAGNOSIS — H9193 Unspecified hearing loss, bilateral: Secondary | ICD-10-CM | POA: Diagnosis not present

## 2022-12-12 DIAGNOSIS — J300 Vasomotor rhinitis: Secondary | ICD-10-CM | POA: Diagnosis not present

## 2022-12-12 DIAGNOSIS — H938X1 Other specified disorders of right ear: Secondary | ICD-10-CM | POA: Diagnosis not present

## 2022-12-12 DIAGNOSIS — H9313 Tinnitus, bilateral: Secondary | ICD-10-CM | POA: Diagnosis not present

## 2022-12-12 DIAGNOSIS — H9201 Otalgia, right ear: Secondary | ICD-10-CM | POA: Diagnosis not present

## 2022-12-12 MED ORDER — VEDOLIZUMAB 300 MG IV SOLR
300.0000 mg | Freq: Once | INTRAVENOUS | Status: AC
  Administered 2022-12-12: 300 mg via INTRAVENOUS
  Filled 2022-12-12: qty 5

## 2022-12-12 MED ORDER — OMEPRAZOLE 40 MG PO CPDR
40.0000 mg | DELAYED_RELEASE_CAPSULE | Freq: Every day | ORAL | 3 refills | Status: DC
  Filled 2022-12-12: qty 30, 30d supply, fill #0
  Filled 2023-01-10: qty 30, 30d supply, fill #1
  Filled 2023-02-06: qty 30, 30d supply, fill #2
  Filled 2023-03-28: qty 30, 30d supply, fill #3

## 2022-12-12 NOTE — Progress Notes (Signed)
Diagnosis: Crohn's Disease  Provider:  Marshell Garfinkel MD  Procedure: Infusion  IV Type: Peripheral, IV Location: L Antecubital  Entyvio (Vedolizumab), Dose: 300 mg  Infusion Start Time: 1980  Infusion Stop Time: 2217  Post Infusion IV Care: Peripheral IV Discontinued  Discharge: Condition: Good, Destination: Home . AVS provided to patient.   Performed by:  Koren Shiver, RN

## 2022-12-13 ENCOUNTER — Encounter: Payer: Self-pay | Admitting: Internal Medicine

## 2022-12-18 ENCOUNTER — Telehealth: Payer: Self-pay | Admitting: Internal Medicine

## 2022-12-18 NOTE — Telephone Encounter (Signed)
Patient messaged me stating that prior authorization for continued Thomas Peterson is needed though he was given his infusion recently at the infusion center apparently going forward he needs a prior Auth.  Just double checking to see if I need to help in any way.  Thank you.

## 2022-12-20 ENCOUNTER — Other Ambulatory Visit (HOSPITAL_BASED_OUTPATIENT_CLINIC_OR_DEPARTMENT_OTHER): Payer: Self-pay

## 2022-12-22 ENCOUNTER — Telehealth: Payer: Self-pay | Admitting: Pharmacy Technician

## 2022-12-22 ENCOUNTER — Other Ambulatory Visit (HOSPITAL_BASED_OUTPATIENT_CLINIC_OR_DEPARTMENT_OTHER): Payer: Self-pay

## 2022-12-22 ENCOUNTER — Other Ambulatory Visit: Payer: Self-pay

## 2022-12-22 ENCOUNTER — Other Ambulatory Visit: Payer: Self-pay | Admitting: Pharmacy Technician

## 2022-12-22 DIAGNOSIS — Z794 Long term (current) use of insulin: Secondary | ICD-10-CM

## 2022-12-22 DIAGNOSIS — E118 Type 2 diabetes mellitus with unspecified complications: Secondary | ICD-10-CM

## 2022-12-22 MED ORDER — BASAGLAR KWIKPEN 100 UNIT/ML ~~LOC~~ SOPN
30.0000 [IU] | PEN_INJECTOR | Freq: Every day | SUBCUTANEOUS | 0 refills | Status: DC
  Filled 2022-12-22 – 2023-04-09 (×2): qty 15, 50d supply, fill #0

## 2022-12-22 NOTE — Telephone Encounter (Addendum)
Auth Submission: APPROVED Payer: [employer] Medication & CPT/J Code(s) submitted: Entyvio (Vedolizumab) [id number] Route of submission (phone, fax, portal):  Phone # [phone] Fax # [phone] Auth type: Buy/Bill Units/visits requested: 6 Reference number: [id number] Approval from: 12/21/22 to 12/21/23   (DOS; 12/12/22 = Thomas Peterson charge - Thomas Peterson/Thomas Peterson  will be notified)

## 2022-12-27 ENCOUNTER — Encounter: Payer: Self-pay | Admitting: Internal Medicine

## 2022-12-27 NOTE — Telephone Encounter (Signed)
Indeed. This has been resolved with the infusion center. Thomas Peterson has submitted the PA and it has been approved as of 1.12.24

## 2023-01-04 ENCOUNTER — Encounter: Payer: Self-pay | Admitting: Internal Medicine

## 2023-01-10 ENCOUNTER — Other Ambulatory Visit (HOSPITAL_BASED_OUTPATIENT_CLINIC_OR_DEPARTMENT_OTHER): Payer: Self-pay

## 2023-01-23 ENCOUNTER — Ambulatory Visit: Payer: Commercial Managed Care - PPO

## 2023-02-06 ENCOUNTER — Ambulatory Visit (INDEPENDENT_AMBULATORY_CARE_PROVIDER_SITE_OTHER): Payer: Commercial Managed Care - PPO

## 2023-02-06 ENCOUNTER — Other Ambulatory Visit (HOSPITAL_BASED_OUTPATIENT_CLINIC_OR_DEPARTMENT_OTHER): Payer: Self-pay

## 2023-02-06 VITALS — BP 118/76 | HR 77 | Temp 97.7°F | Resp 18 | Ht 71.0 in | Wt 190.4 lb

## 2023-02-06 DIAGNOSIS — K5080 Crohn's disease of both small and large intestine without complications: Secondary | ICD-10-CM | POA: Diagnosis not present

## 2023-02-06 DIAGNOSIS — K50818 Crohn's disease of both small and large intestine with other complication: Secondary | ICD-10-CM

## 2023-02-06 DIAGNOSIS — R519 Headache, unspecified: Secondary | ICD-10-CM | POA: Diagnosis not present

## 2023-02-06 DIAGNOSIS — J324 Chronic pansinusitis: Secondary | ICD-10-CM | POA: Diagnosis not present

## 2023-02-06 MED ORDER — VEDOLIZUMAB 300 MG IV SOLR
300.0000 mg | Freq: Once | INTRAVENOUS | Status: AC
  Administered 2023-02-06: 300 mg via INTRAVENOUS
  Filled 2023-02-06: qty 5

## 2023-02-06 NOTE — Progress Notes (Signed)
Diagnosis: Crohn's Disease  Provider:  Marshell Garfinkel MD  Procedure: Infusion  IV Type: Peripheral, IV Location: L Forearm  Entyvio (Vedolizumab), Dose: 300 mg  Infusion Start Time: [id number]  Infusion Stop Time: 1400  Post Infusion IV Care: Peripheral IV Discontinued  Discharge: Condition: Good, Destination: Home . AVS Declined  Performed by:  Arnoldo Morale, RN

## 2023-02-19 ENCOUNTER — Other Ambulatory Visit: Payer: Self-pay | Admitting: Family Medicine

## 2023-02-19 DIAGNOSIS — E118 Type 2 diabetes mellitus with unspecified complications: Secondary | ICD-10-CM

## 2023-02-20 ENCOUNTER — Encounter: Payer: Self-pay | Admitting: Internal Medicine

## 2023-02-20 ENCOUNTER — Other Ambulatory Visit (HOSPITAL_BASED_OUTPATIENT_CLINIC_OR_DEPARTMENT_OTHER): Payer: Self-pay

## 2023-02-20 ENCOUNTER — Other Ambulatory Visit: Payer: Self-pay

## 2023-02-20 MED ORDER — PIOGLITAZONE HCL 45 MG PO TABS
45.0000 mg | ORAL_TABLET | Freq: Every day | ORAL | 0 refills | Status: DC
  Filled 2023-02-20: qty 90, 90d supply, fill #0

## 2023-02-28 DIAGNOSIS — H16223 Keratoconjunctivitis sicca, not specified as Sjogren's, bilateral: Secondary | ICD-10-CM | POA: Diagnosis not present

## 2023-02-28 DIAGNOSIS — G514 Facial myokymia: Secondary | ICD-10-CM | POA: Diagnosis not present

## 2023-02-28 DIAGNOSIS — H40053 Ocular hypertension, bilateral: Secondary | ICD-10-CM | POA: Diagnosis not present

## 2023-03-21 ENCOUNTER — Other Ambulatory Visit: Payer: Self-pay | Admitting: Internal Medicine

## 2023-03-21 ENCOUNTER — Other Ambulatory Visit (HOSPITAL_BASED_OUTPATIENT_CLINIC_OR_DEPARTMENT_OTHER): Payer: Self-pay

## 2023-03-21 ENCOUNTER — Other Ambulatory Visit: Payer: Self-pay | Admitting: Family Medicine

## 2023-03-21 MED ORDER — CELECOXIB 200 MG PO CAPS
200.0000 mg | ORAL_CAPSULE | Freq: Two times a day (BID) | ORAL | 1 refills | Status: DC
  Filled 2023-03-28: qty 60, 30d supply, fill #0
  Filled 2023-04-25: qty 60, 30d supply, fill #1

## 2023-03-21 MED ORDER — TECHLITE PEN NEEDLES 32G X 6 MM MISC
1 refills | Status: DC
  Filled 2023-03-28: qty 100, 90d supply, fill #0
  Filled 2023-07-12: qty 100, 90d supply, fill #1

## 2023-03-28 ENCOUNTER — Other Ambulatory Visit (HOSPITAL_BASED_OUTPATIENT_CLINIC_OR_DEPARTMENT_OTHER): Payer: Self-pay

## 2023-03-28 ENCOUNTER — Encounter: Payer: Self-pay | Admitting: Internal Medicine

## 2023-04-03 ENCOUNTER — Ambulatory Visit: Payer: Commercial Managed Care - PPO

## 2023-04-09 ENCOUNTER — Other Ambulatory Visit (HOSPITAL_BASED_OUTPATIENT_CLINIC_OR_DEPARTMENT_OTHER): Payer: Self-pay

## 2023-04-10 ENCOUNTER — Ambulatory Visit (INDEPENDENT_AMBULATORY_CARE_PROVIDER_SITE_OTHER): Payer: Commercial Managed Care - PPO

## 2023-04-10 VITALS — BP 112/72 | HR 70 | Temp 97.8°F | Resp 18 | Ht 72.0 in | Wt 192.6 lb

## 2023-04-10 DIAGNOSIS — K50818 Crohn's disease of both small and large intestine with other complication: Secondary | ICD-10-CM

## 2023-04-10 MED ORDER — VEDOLIZUMAB 300 MG IV SOLR
300.0000 mg | Freq: Once | INTRAVENOUS | Status: AC
  Administered 2023-04-10: 300 mg via INTRAVENOUS
  Filled 2023-04-10: qty 5

## 2023-04-10 NOTE — Progress Notes (Signed)
Diagnosis: Crohn's Disease  Provider:  Chilton Greathouse MD  Procedure: IV Infusion  IV Type: Peripheral, IV Location: L Forearm  Entyvio (Vedolizumab), Dose: 300 mg  Infusion Start Time: 1502  Infusion Stop Time: 1535  Post Infusion IV Care: Peripheral IV Discontinued  Discharge: Condition: Good, Destination: Home . AVS Provided and AVS Declined  Performed by:  Garnette Czech, RN

## 2023-04-13 ENCOUNTER — Other Ambulatory Visit: Payer: Self-pay

## 2023-04-13 NOTE — Progress Notes (Unsigned)
[facility] [address], [address] [place], [place] [address] [phone]  Date:  04/16/2023   Name:  Thomas Peterson   DOB:  01/28/1961   MRN:  [id number]  PCP:  Pearline Cables, MD    Chief Complaint: No chief complaint on file.   History of Present Illness:  Thomas Peterson is a 62 y.o. very pleasant male patient who presents with the following:  Pt seen today for a CPE Last seen by myself in July  History of Crohn's disease, diabetes, hyperlipidemia, hypertension, skin cancer-melanoma   Crohn's disease is managed by Dr. 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- last seen in March  Seen by derm also in December   Tetanus Eye exam UTD Needs urine micro Covid booster Lab Results  Component Value Date   HGBA1C 7.5 (H) 09/20/2022   Effexor Valsartan/hctz Actos Prilosec  Basaglar 30 u Farxiga Repatha  amlodipine Patient Active Problem List   Diagnosis Date Noted   Long-term use of immunosuppressant medication- Entyvio 09/17/2018   Low back pain 06/04/2018   Controlled type 2 diabetes mellitus with complication, with long-term current use of insulin (HCC) 01/29/2017   Essential hypertension 01/29/2017   Crohn's ileocolitis (HCC) 01/29/2017   Mixed hyperlipidemia 01/29/2017   Leg cramps 01/29/2017   History of skin cancer 01/29/2017    Past Medical History:  Diagnosis Date   Arthritis 2020   left hand   Crohn's disease without complication (HCC) 01/29/2017   Diabetes mellitus type 2 with complications (HCC)    Diabetes mellitus without complication (HCC)    Dyslipidemia    Glaucoma    bilateral   History of basal cell carcinoma    History of melanoma    History of mood disorder    Hyperlipidemia    Hypertension    Lower back pain     Past Surgical History:  Procedure Laterality Date   COLONOSCOPY     KNEE  ARTHROSCOPY Right 1999   MELANOMA EXCISION  1999   and squamous and basal cell excisions   TONSILLECTOMY  1965    Social History   Tobacco Use   Smoking status: Former    Years: 12    Types: Cigarettes    Quit date: 1995    Years since quitting: 29.3    Passive exposure: Never   Smokeless tobacco: Never   Tobacco comments:    Off and on smoker   Vaping Use   Vaping Use: Never used  Substance Use Topics   Alcohol use: Yes    Comment: occasional-1-2 per week   Drug use: Never    Family History  Problem Relation Age of Onset   Breast cancer Mother    Heart attack Mother    Eczema Brother    Cancer Brother        type unknown, mets   Diabetes Brother    Rheum arthritis Paternal Aunt    Crohn's disease Paternal Uncle    Food Allergy Daughter    Stomach cancer Neg Hx    Colon cancer Neg Hx    Esophageal cancer Neg Hx    Asthma Neg Hx    Immunodeficiency Neg Hx    Angioedema Neg Hx    Atopy Neg Hx     Allergies  Allergen Reactions   Sulfa Antibiotics Anaphylaxis    "I  got these purple spots everywhere"    Ciprofloxacin    Flagyl [Metronidazole]     GI effects    Metformin And Related     Worsens his crohn disease sx, diarrhea    Statins Other (See Comments)    Leg cramps   Timolol     "Burning of the eyes"   Mesalamine Other (See Comments)    Gastrointestinal distress    Medication list has been reviewed and updated.  Current Outpatient Medications on File Prior to Visit  Medication Sig Dispense Refill   amLODipine (NORVASC) 10 MG tablet TAKE 1 TABLET (10 MG TOTAL) BY MOUTH DAILY. 90 tablet 3   amoxicillin (AMOXIL) 500 MG capsule Take 2 capsules (1,000 mg total) by mouth 2 (two) times daily. (Patient not taking: Reported on 09/05/2022) 40 capsule 0   Boswellia Serrata (BOSWELLIA PO) Take 1 tablet by mouth 2 (two) times daily.     budesonide (ENTOCORT EC) 3 MG 24 hr capsule Take 1 capsule (3 mg total) by mouth daily. 90 capsule 1   celecoxib (CELEBREX)  200 MG capsule Take 1 capsule (200 mg total) by mouth 2 (two) times daily. 60 capsule 1   Cholecalciferol (VITAMIN D3 PO) Take by mouth. 2500 iu daily     dapagliflozin propanediol (FARXIGA) 10 MG TABS tablet Take 1 tablet (10 mg total) by mouth daily. 90 tablet 3   diclofenac sodium (VOLTAREN) 1 % GEL Apply 2 g topically 4 (four) times daily. Apply to hand joints as needed. Max 16 gm per joint and 32 grams total daily 100 g 2   Evolocumab (REPATHA SURECLICK) 140 MG/ML SOAJ Inject 140 mg into the skin every 14 (fourteen) days. 2 mL 6   famotidine (PEPCID) 20 MG tablet Take 1 tablet (20 mg total) by mouth 2 (two) times daily. 60 tablet 3   fluticasone (FLONASE) 50 MCG/ACT nasal spray Place 1 spray into both nostrils daily. 16 g 5   Insulin Glargine (BASAGLAR KWIKPEN) 100 UNIT/ML Inject 30 Units into the skin daily. 15 mL 0   Insulin Pen Needle (TECHLITE PEN NEEDLES) 32G X 6 MM MISC USE ONCE DAILY. 100 each 1   ipratropium (ATROVENT) 0.03 % nasal spray Place 2 sprays into both nostrils 3 (three) times daily as needed for nasal drainage 30 mL 12   Multiple Vitamin (MULTIVITAMIN) tablet Take 1 tablet by mouth daily.     Omega-3 Fatty Acids (FISH OIL PO) Take 4 g by mouth daily.     omeprazole (PRILOSEC) 40 MG capsule Take 1 capsule (40 mg total) by mouth daily. 30 capsule 3   pioglitazone (ACTOS) 45 MG tablet Take 1 tablet (45 mg total) by mouth daily. 90 tablet 0   Probiotic Product (VSL#3 PO) Take by mouth as needed.     sodium chloride 0.9 % SOLN 250 mL with vedolizumab 300 MG SOLR 300 mg Inject 300 mg into the vein every 8 (eight) weeks.     timolol (TIMOPTIC) 0.5 % ophthalmic solution Place 1 drop into both eyes 2 times daily. 10 mL 11   valsartan-hydrochlorothiazide (DIOVAN-HCT) 320-25 MG tablet TAKE 1 TABLET BY MOUTH DAILY. 90 tablet 3   venlafaxine XR (EFFEXOR-XR) 37.5 MG 24 hr capsule Take 1 capsule (37.5 mg total) by mouth daily with breakfast. 90 capsule 3   No current  facility-administered medications on file prior to visit.    Review of Systems:  As per HPI- otherwise negative.  Physical Examination: There were no vitals filed for this  visit. There were no vitals filed for this visit. There is no height or weight on file to calculate BMI. Ideal Body Weight:   GEN: no acute distress. HEENT: Atraumatic, Normocephalic.  Ears and Nose: No external deformity. CV: RRR, No M/G/R. No JVD. No thrill. No extra heart sounds. PULM: CTA B, no wheezes, crackles, rhonchi. No retractions. No resp. distress. No accessory muscle use. ABD: S, NT, ND, +BS. No rebound. No HSM. EXTR: No c/c/e PSYCH: Normally interactive. Conversant.    Assessment and Plan: ***  Signed Abbe Amsterdam, MD

## 2023-04-13 NOTE — Patient Instructions (Incomplete)
Good to see you again today - I will be in touch with your labs asap Tetanus today Stop by imaging on the ground floor to set up your CT scan Head to [place] [facility] on [address] for films of your shoulder and [facility] joints  Address: [address] [place], [place], [place] [address] Open ? Closes 7?PM Phone: [phone]

## 2023-04-16 ENCOUNTER — Other Ambulatory Visit: Payer: Self-pay

## 2023-04-16 ENCOUNTER — Ambulatory Visit (INDEPENDENT_AMBULATORY_CARE_PROVIDER_SITE_OTHER): Payer: Commercial Managed Care - PPO | Admitting: Family Medicine

## 2023-04-16 ENCOUNTER — Encounter: Payer: Self-pay | Admitting: Family Medicine

## 2023-04-16 ENCOUNTER — Other Ambulatory Visit (HOSPITAL_BASED_OUTPATIENT_CLINIC_OR_DEPARTMENT_OTHER): Payer: Self-pay

## 2023-04-16 ENCOUNTER — Ambulatory Visit
Admission: RE | Admit: 2023-04-16 | Discharge: 2023-04-16 | Disposition: A | Discharge status: Home / Self Care | Payer: Commercial Managed Care - PPO | Source: Ambulatory Visit | Attending: Family Medicine | Admitting: Family Medicine

## 2023-04-16 VITALS — BP 124/70 | HR 66 | Temp 97.9°F | Resp 18 | Ht 71.0 in | Wt 193.2 lb

## 2023-04-16 DIAGNOSIS — Z85828 Personal history of other malignant neoplasm of skin: Secondary | ICD-10-CM

## 2023-04-16 DIAGNOSIS — M25512 Pain in left shoulder: Secondary | ICD-10-CM | POA: Diagnosis not present

## 2023-04-16 DIAGNOSIS — Z23 Encounter for immunization: Secondary | ICD-10-CM | POA: Diagnosis not present

## 2023-04-16 DIAGNOSIS — F411 Generalized anxiety disorder: Secondary | ICD-10-CM

## 2023-04-16 DIAGNOSIS — Z0000 Encounter for general adult medical examination without abnormal findings: Secondary | ICD-10-CM

## 2023-04-16 DIAGNOSIS — I10 Essential (primary) hypertension: Secondary | ICD-10-CM

## 2023-04-16 DIAGNOSIS — R430 Anosmia: Secondary | ICD-10-CM

## 2023-04-16 DIAGNOSIS — E782 Mixed hyperlipidemia: Secondary | ICD-10-CM

## 2023-04-16 DIAGNOSIS — W19XXXA Unspecified fall, initial encounter: Secondary | ICD-10-CM | POA: Diagnosis not present

## 2023-04-16 DIAGNOSIS — E118 Type 2 diabetes mellitus with unspecified complications: Secondary | ICD-10-CM | POA: Diagnosis not present

## 2023-04-16 DIAGNOSIS — Z0001 Encounter for general adult medical examination with abnormal findings: Secondary | ICD-10-CM | POA: Diagnosis not present

## 2023-04-16 DIAGNOSIS — Z794 Long term (current) use of insulin: Secondary | ICD-10-CM

## 2023-04-16 DIAGNOSIS — Z125 Encounter for screening for malignant neoplasm of prostate: Secondary | ICD-10-CM | POA: Diagnosis not present

## 2023-04-16 DIAGNOSIS — K50818 Crohn's disease of both small and large intestine with other complication: Secondary | ICD-10-CM

## 2023-04-16 MED ORDER — VENLAFAXINE HCL ER 37.5 MG PO CP24
37.5000 mg | ORAL_CAPSULE | Freq: Every day | ORAL | 3 refills | Status: DC
Start: 2023-04-16 — End: 2024-05-31
  Filled 2023-04-16 – 2023-08-03 (×2): qty 90, 90d supply, fill #0
  Filled 2023-12-16: qty 90, 90d supply, fill #1

## 2023-04-16 MED ORDER — PIOGLITAZONE HCL 45 MG PO TABS
45.0000 mg | ORAL_TABLET | Freq: Every day | ORAL | 3 refills | Status: DC
Start: 2023-04-16 — End: 2024-06-19
  Filled 2023-04-16 – 2023-06-14 (×2): qty 90, 90d supply, fill #0
  Filled 2023-09-18: qty 90, 90d supply, fill #1
  Filled 2023-12-16: qty 90, 90d supply, fill #2

## 2023-04-16 MED ORDER — VALSARTAN-HYDROCHLOROTHIAZIDE 320-25 MG PO TABS
1.0000 | ORAL_TABLET | Freq: Every day | ORAL | 3 refills | Status: DC
Start: 2023-04-16 — End: 2024-05-31
  Filled 2023-04-16 – 2023-07-12 (×2): qty 90, 90d supply, fill #0
  Filled 2023-10-11: qty 90, 90d supply, fill #1
  Filled 2024-02-12: qty 90, 90d supply, fill #2

## 2023-04-16 MED ORDER — OMEPRAZOLE 40 MG PO CPDR
40.0000 mg | DELAYED_RELEASE_CAPSULE | Freq: Every day | ORAL | 3 refills | Status: DC
Start: 2023-04-16 — End: 2024-05-31
  Filled 2023-04-16 – 2023-04-25 (×2): qty 90, 90d supply, fill #0
  Filled 2023-08-03: qty 90, 90d supply, fill #1
  Filled 2023-11-02: qty 90, 90d supply, fill #2
  Filled 2024-02-12: qty 90, 90d supply, fill #3

## 2023-04-16 MED ORDER — BASAGLAR KWIKPEN 100 UNIT/ML ~~LOC~~ SOPN
30.0000 [IU] | PEN_INJECTOR | Freq: Every day | SUBCUTANEOUS | 3 refills | Status: DC
Start: 2023-04-16 — End: 2024-01-17
  Filled 2023-04-16 – 2023-06-14 (×2): qty 15, 50d supply, fill #0
  Filled 2023-08-03: qty 15, 50d supply, fill #1
  Filled 2023-09-18: qty 15, 50d supply, fill #2
  Filled 2023-11-27: qty 15, 50d supply, fill #3

## 2023-04-16 MED ORDER — AMLODIPINE BESYLATE 10 MG PO TABS
10.0000 mg | ORAL_TABLET | Freq: Every day | ORAL | 3 refills | Status: DC
Start: 2023-04-16 — End: 2024-05-05
  Filled 2023-04-16 – 2023-06-20 (×4): qty 90, 90d supply, fill #0
  Filled 2023-09-18: qty 90, 90d supply, fill #1
  Filled 2023-12-16: qty 90, 90d supply, fill #2

## 2023-04-16 MED ORDER — DAPAGLIFLOZIN PROPANEDIOL 10 MG PO TABS
10.0000 mg | ORAL_TABLET | Freq: Every day | ORAL | 3 refills | Status: DC
Start: 2023-04-16 — End: 2024-05-08
  Filled 2023-04-16 – 2023-07-12 (×2): qty 90, 90d supply, fill #0
  Filled 2023-10-11: qty 90, 90d supply, fill #1
  Filled 2024-01-17: qty 90, 90d supply, fill #2

## 2023-04-17 ENCOUNTER — Encounter: Payer: Self-pay | Admitting: Internal Medicine

## 2023-04-17 ENCOUNTER — Encounter: Payer: Self-pay | Admitting: Family Medicine

## 2023-04-17 LAB — COMPREHENSIVE METABOLIC PANEL
ALT: 16 U/L (ref 0–53)
AST: 21 U/L (ref 0–37)
Albumin: 3.9 g/dL (ref 3.5–5.2)
Alkaline Phosphatase: 45 U/L (ref 39–117)
BUN: 22 mg/dL (ref 6–23)
CO2: 32 mEq/L (ref 19–32)
Calcium: 9 mg/dL (ref 8.4–10.5)
Chloride: 96 mEq/L (ref 96–112)
Creatinine, Ser: 1.24 mg/dL (ref 0.40–1.50)
GFR: 61.6 mL/min (ref >60.00)
Glucose, Bld: 168 mg/dL — ABNORMAL HIGH (ref 70–99)
Potassium: 3.7 mEq/L (ref 3.5–5.1)
Sodium: 137 mEq/L (ref 135–145)
Total Bilirubin: 0.6 mg/dL (ref 0.2–1.2)
Total Protein: 6.5 g/dL (ref 6.0–8.3)

## 2023-04-17 LAB — LIPID PANEL
Cholesterol: 161 mg/dL (ref 0–200)
HDL: 36.8 mg/dL — ABNORMAL LOW (ref >39.00)
NonHDL: 124.19
Total CHOL/HDL Ratio: 4
Triglycerides: 304 mg/dL — ABNORMAL HIGH (ref 0.0–149.0)
VLDL: 60.8 mg/dL — ABNORMAL HIGH (ref 0.0–40.0)

## 2023-04-17 LAB — CBC
HCT: 46.4 % (ref 39.0–52.0)
Hemoglobin: 15.8 g/dL (ref 13.0–17.0)
MCHC: 34 g/dL (ref 30.0–36.0)
MCV: 91.7 fl (ref 78.0–100.0)
Platelets: 359 10*3/uL (ref 150.0–400.0)
RBC: 5.07 Mil/uL (ref 4.22–5.81)
RDW: 14.4 % (ref 11.5–15.5)
WBC: 7.8 10*3/uL (ref 4.0–10.5)

## 2023-04-17 LAB — PSA: PSA: 1.1 ng/mL (ref 0.10–4.00)

## 2023-04-17 LAB — MICROALBUMIN / CREATININE URINE RATIO
Creatinine,U: 48.7 mg/dL
Microalb Creat Ratio: 1.5 mg/g (ref 0.0–30.0)
Microalb, Ur: 0.7 mg/dL (ref 0.0–1.9)

## 2023-04-17 LAB — LDL CHOLESTEROL, DIRECT: Direct LDL: 77 mg/dL

## 2023-04-17 LAB — HEMOGLOBIN A1C: Hgb A1c MFr Bld: 7.7 % — ABNORMAL HIGH (ref 4.6–6.5)

## 2023-04-25 ENCOUNTER — Other Ambulatory Visit (HOSPITAL_BASED_OUTPATIENT_CLINIC_OR_DEPARTMENT_OTHER): Payer: Self-pay

## 2023-04-25 ENCOUNTER — Other Ambulatory Visit: Payer: Self-pay

## 2023-04-27 ENCOUNTER — Ambulatory Visit (HOSPITAL_BASED_OUTPATIENT_CLINIC_OR_DEPARTMENT_OTHER)
Admission: RE | Admit: 2023-04-27 | Discharge: 2023-04-27 | Disposition: A | Discharge status: Home / Self Care | Payer: Commercial Managed Care - PPO | Source: Ambulatory Visit | Attending: Family Medicine | Admitting: Family Medicine

## 2023-04-27 DIAGNOSIS — R430 Anosmia: Secondary | ICD-10-CM | POA: Insufficient documentation

## 2023-04-27 DIAGNOSIS — J329 Chronic sinusitis, unspecified: Secondary | ICD-10-CM | POA: Diagnosis not present

## 2023-04-28 ENCOUNTER — Encounter: Payer: Self-pay | Admitting: Family Medicine

## 2023-05-02 ENCOUNTER — Encounter (INDEPENDENT_AMBULATORY_CARE_PROVIDER_SITE_OTHER): Payer: Commercial Managed Care - PPO | Admitting: Family Medicine

## 2023-05-03 DIAGNOSIS — S70269A Insect bite (nonvenomous), unspecified hip, initial encounter: Secondary | ICD-10-CM

## 2023-05-03 NOTE — Telephone Encounter (Signed)
Please see the MyChart message reply(ies) for my assessment and plan.  The patient gave consent for this Medical Advice Message and is aware that it may result in a bill to their insurance company as well as the possibility that this may result in a co-payment or deductible. They are an established patient, but are not seeking medical advice exclusively about a problem treated during an in person or video visit in the last 7 days. I did not recommend an in person or video visit within 7 days of my reply.  I spent a total of 10 minutes cumulative time within 7 days through MyChart messaging Julyan Gales, MD  

## 2023-05-17 ENCOUNTER — Other Ambulatory Visit (HOSPITAL_BASED_OUTPATIENT_CLINIC_OR_DEPARTMENT_OTHER): Payer: Self-pay

## 2023-06-05 ENCOUNTER — Ambulatory Visit (INDEPENDENT_AMBULATORY_CARE_PROVIDER_SITE_OTHER): Payer: Commercial Managed Care - PPO

## 2023-06-05 VITALS — BP 126/74 | HR 76 | Temp 97.4°F | Resp 16

## 2023-06-05 DIAGNOSIS — K50818 Crohn's disease of both small and large intestine with other complication: Secondary | ICD-10-CM | POA: Diagnosis not present

## 2023-06-05 MED ORDER — VEDOLIZUMAB 300 MG IV SOLR
300.0000 mg | Freq: Once | INTRAVENOUS | Status: AC
  Administered 2023-06-05: 300 mg via INTRAVENOUS
  Filled 2023-06-05: qty 5

## 2023-06-05 NOTE — Progress Notes (Signed)
Diagnosis: Crohn's Disease  Provider:  Chilton Greathouse MD  Procedure: IV Infusion  IV Type: Peripheral, IV Location: L Antecubital  Entyvio (Vedolizumab), Dose: 300 mg  Infusion Start Time: 1458  Infusion Stop Time: 1532  Post Infusion IV Care: Peripheral IV Discontinued  Discharge: Condition: Good, Destination: Home . AVS Declined  Performed by:  Loney Hering, LPN

## 2023-06-13 DIAGNOSIS — M25512 Pain in left shoulder: Secondary | ICD-10-CM | POA: Diagnosis not present

## 2023-06-14 ENCOUNTER — Other Ambulatory Visit: Payer: Self-pay | Admitting: Internal Medicine

## 2023-06-15 ENCOUNTER — Other Ambulatory Visit: Payer: Self-pay | Admitting: Orthopedic Surgery

## 2023-06-15 ENCOUNTER — Other Ambulatory Visit (HOSPITAL_BASED_OUTPATIENT_CLINIC_OR_DEPARTMENT_OTHER): Payer: Self-pay

## 2023-06-15 ENCOUNTER — Other Ambulatory Visit: Payer: Self-pay

## 2023-06-15 DIAGNOSIS — M25512 Pain in left shoulder: Secondary | ICD-10-CM

## 2023-06-15 MED ORDER — BUDESONIDE 3 MG PO CPEP
3.0000 mg | ORAL_CAPSULE | Freq: Every day | ORAL | 0 refills | Status: DC
  Filled 2023-06-15 – 2023-08-03 (×2): qty 90, 90d supply, fill #0

## 2023-06-19 ENCOUNTER — Other Ambulatory Visit (HOSPITAL_COMMUNITY): Payer: Self-pay

## 2023-06-20 ENCOUNTER — Other Ambulatory Visit (HOSPITAL_BASED_OUTPATIENT_CLINIC_OR_DEPARTMENT_OTHER): Payer: Self-pay

## 2023-06-20 ENCOUNTER — Other Ambulatory Visit: Payer: Self-pay

## 2023-06-29 ENCOUNTER — Ambulatory Visit
Admission: RE | Admit: 2023-06-29 | Discharge: 2023-06-29 | Disposition: A | Discharge status: Home / Self Care | Payer: Commercial Managed Care - PPO | Source: Ambulatory Visit | Attending: Orthopedic Surgery | Admitting: Orthopedic Surgery

## 2023-06-29 DIAGNOSIS — M25512 Pain in left shoulder: Secondary | ICD-10-CM | POA: Diagnosis not present

## 2023-06-29 DIAGNOSIS — M7582 Other shoulder lesions, left shoulder: Secondary | ICD-10-CM | POA: Diagnosis not present

## 2023-06-29 DIAGNOSIS — M19012 Primary osteoarthritis, left shoulder: Secondary | ICD-10-CM | POA: Diagnosis not present

## 2023-07-03 DIAGNOSIS — J341 Cyst and mucocele of nose and nasal sinus: Secondary | ICD-10-CM | POA: Diagnosis not present

## 2023-07-03 DIAGNOSIS — J351 Hypertrophy of tonsils: Secondary | ICD-10-CM | POA: Diagnosis not present

## 2023-07-07 ENCOUNTER — Other Ambulatory Visit (HOSPITAL_BASED_OUTPATIENT_CLINIC_OR_DEPARTMENT_OTHER): Payer: Self-pay | Admitting: Otolaryngology

## 2023-07-07 DIAGNOSIS — J341 Cyst and mucocele of nose and nasal sinus: Secondary | ICD-10-CM

## 2023-07-12 ENCOUNTER — Other Ambulatory Visit (HOSPITAL_BASED_OUTPATIENT_CLINIC_OR_DEPARTMENT_OTHER): Payer: Self-pay

## 2023-07-12 DIAGNOSIS — M19012 Primary osteoarthritis, left shoulder: Secondary | ICD-10-CM | POA: Diagnosis not present

## 2023-07-23 DIAGNOSIS — M19012 Primary osteoarthritis, left shoulder: Secondary | ICD-10-CM | POA: Diagnosis not present

## 2023-07-31 ENCOUNTER — Ambulatory Visit (INDEPENDENT_AMBULATORY_CARE_PROVIDER_SITE_OTHER): Payer: Commercial Managed Care - PPO

## 2023-07-31 VITALS — BP 137/82 | HR 70 | Temp 98.1°F | Resp 16 | Ht 72.0 in | Wt 187.6 lb

## 2023-07-31 DIAGNOSIS — K50818 Crohn's disease of both small and large intestine with other complication: Secondary | ICD-10-CM

## 2023-07-31 MED ORDER — VEDOLIZUMAB 300 MG IV SOLR
300.0000 mg | Freq: Once | INTRAVENOUS | Status: AC
  Administered 2023-07-31: 300 mg via INTRAVENOUS
  Filled 2023-07-31: qty 5

## 2023-07-31 NOTE — Progress Notes (Signed)
Diagnosis: Crohn's Disease  Provider:  Chilton Greathouse MD  Procedure: IV Infusion  IV Type: Peripheral, IV Location: L Forearm  Entyvio (Vedolizumab), Dose: 300 mg  Infusion Start Time: 1507  Infusion Stop Time: 1540  Post Infusion IV Care: Peripheral IV Discontinued  Discharge: Condition: Good, Destination: Home . AVS Declined  Performed by:  Adriana Mccallum, RN

## 2023-08-03 ENCOUNTER — Other Ambulatory Visit (HOSPITAL_BASED_OUTPATIENT_CLINIC_OR_DEPARTMENT_OTHER): Payer: Self-pay

## 2023-08-22 ENCOUNTER — Other Ambulatory Visit (HOSPITAL_BASED_OUTPATIENT_CLINIC_OR_DEPARTMENT_OTHER): Payer: Self-pay

## 2023-08-22 ENCOUNTER — Other Ambulatory Visit: Payer: Self-pay

## 2023-08-22 ENCOUNTER — Telehealth: Payer: Self-pay | Admitting: Internal Medicine

## 2023-08-22 ENCOUNTER — Encounter: Payer: Self-pay | Admitting: Family Medicine

## 2023-08-22 DIAGNOSIS — R197 Diarrhea, unspecified: Secondary | ICD-10-CM

## 2023-08-22 MED ORDER — AZITHROMYCIN 500 MG PO TABS
500.0000 mg | ORAL_TABLET | Freq: Every day | ORAL | 0 refills | Status: DC
Start: 2023-08-22 — End: 2023-12-19
  Filled 2023-08-22: qty 3, 3d supply, fill #0

## 2023-08-22 NOTE — Telephone Encounter (Signed)
Previous azithromycin given for E. Coli Given current treatment we can repeat 500 mg once daily x 3 days If symptoms not improved he needs to follow back up with Dr. Faustino Congress and have additional stool studies and fecal calprotectin

## 2023-08-22 NOTE — Telephone Encounter (Signed)
Patient called states he is having a lot of gas\pain and not responding to Budesonide medication.

## 2023-08-22 NOTE — Telephone Encounter (Signed)
Dr. Leone Payor Pt.  Pt stated that he is a crohn's pt and his is currently on Entyvio and takes Bud[place]onide as needed. Pt stated for about a week now that he has had lower abdominal pain, indigestion and gas. Pt stated that he had these same symptoms a year ago and Dr Leone Payor prescribed Azithromycin 500 mg tablets. ( 3 tablets) and this really helped his symptoms. Pt stated that he has been taking 9 mg of the budesonide daily for a week now with no relief of  his symptoms. Pt stated he is going out of town and requesting a prescription for the Azithromycin.  Please review and advise as DOD

## 2023-08-22 NOTE — Telephone Encounter (Signed)
Pt made aware of Dr. Rhea Belton recommendations: Prescription was sent to pharmacy. Pt made aware.  Pt verbalized understanding with all questions answered.

## 2023-09-05 DIAGNOSIS — H35362 Drusen (degenerative) of macula, left eye: Secondary | ICD-10-CM | POA: Diagnosis not present

## 2023-09-05 DIAGNOSIS — H5213 Myopia, bilateral: Secondary | ICD-10-CM | POA: Diagnosis not present

## 2023-09-05 DIAGNOSIS — H33321 Round hole, right eye: Secondary | ICD-10-CM | POA: Diagnosis not present

## 2023-09-05 DIAGNOSIS — H2513 Age-related nuclear cataract, bilateral: Secondary | ICD-10-CM | POA: Diagnosis not present

## 2023-09-05 DIAGNOSIS — H40053 Ocular hypertension, bilateral: Secondary | ICD-10-CM | POA: Diagnosis not present

## 2023-09-05 DIAGNOSIS — H524 Presbyopia: Secondary | ICD-10-CM | POA: Diagnosis not present

## 2023-09-05 DIAGNOSIS — H35371 Puckering of macula, right eye: Secondary | ICD-10-CM | POA: Diagnosis not present

## 2023-09-05 DIAGNOSIS — H52203 Unspecified astigmatism, bilateral: Secondary | ICD-10-CM | POA: Diagnosis not present

## 2023-09-05 DIAGNOSIS — Z83518 Family history of other specified eye disorder: Secondary | ICD-10-CM | POA: Diagnosis not present

## 2023-09-05 DIAGNOSIS — H43813 Vitreous degeneration, bilateral: Secondary | ICD-10-CM | POA: Diagnosis not present

## 2023-09-12 ENCOUNTER — Other Ambulatory Visit (HOSPITAL_BASED_OUTPATIENT_CLINIC_OR_DEPARTMENT_OTHER): Payer: Self-pay

## 2023-09-14 ENCOUNTER — Ambulatory Visit (HOSPITAL_BASED_OUTPATIENT_CLINIC_OR_DEPARTMENT_OTHER): Admission: RE | Admit: 2023-09-14 | Payer: Commercial Managed Care - PPO | Source: Ambulatory Visit

## 2023-09-14 ENCOUNTER — Telehealth (HOSPITAL_BASED_OUTPATIENT_CLINIC_OR_DEPARTMENT_OTHER): Payer: Self-pay

## 2023-09-17 ENCOUNTER — Other Ambulatory Visit (HOSPITAL_BASED_OUTPATIENT_CLINIC_OR_DEPARTMENT_OTHER): Payer: Self-pay

## 2023-09-18 ENCOUNTER — Other Ambulatory Visit (HOSPITAL_BASED_OUTPATIENT_CLINIC_OR_DEPARTMENT_OTHER): Payer: Self-pay

## 2023-09-18 ENCOUNTER — Other Ambulatory Visit (HOSPITAL_BASED_OUTPATIENT_CLINIC_OR_DEPARTMENT_OTHER): Payer: Self-pay | Admitting: Otolaryngology

## 2023-09-18 DIAGNOSIS — J341 Cyst and mucocele of nose and nasal sinus: Secondary | ICD-10-CM

## 2023-09-18 MED ORDER — TIMOLOL MALEATE 0.5 % OP SOLN
1.0000 [drp] | Freq: Two times a day (BID) | OPHTHALMIC | 11 refills | Status: DC
  Filled 2023-09-18: qty 10, 50d supply, fill #0
  Filled 2023-11-02: qty 10, 50d supply, fill #1
  Filled 2023-12-28: qty 10, 50d supply, fill #2

## 2023-09-19 DIAGNOSIS — M19012 Primary osteoarthritis, left shoulder: Secondary | ICD-10-CM | POA: Diagnosis not present

## 2023-09-25 ENCOUNTER — Ambulatory Visit: Payer: Commercial Managed Care - PPO

## 2023-09-25 VITALS — BP 128/74 | HR 71 | Temp 98.0°F | Resp 16 | Ht 72.0 in | Wt 189.8 lb

## 2023-09-25 DIAGNOSIS — K50818 Crohn's disease of both small and large intestine with other complication: Secondary | ICD-10-CM | POA: Diagnosis not present

## 2023-09-25 MED ORDER — VEDOLIZUMAB 300 MG IV SOLR
300.0000 mg | Freq: Once | INTRAVENOUS | Status: AC
  Administered 2023-09-25: 300 mg via INTRAVENOUS
  Filled 2023-09-25: qty 5

## 2023-09-25 NOTE — Progress Notes (Signed)
Diagnosis: Crohn's Disease  Provider:  Chilton Greathouse MD  Procedure: IV Infusion  IV Type: Peripheral, IV Location: L Antecubital  Entyvio (Vedolizumab), Dose: 300 mg  Infusion Start Time: 1525  Infusion Stop Time: 1555  Post Infusion IV Care: Peripheral IV Discontinued  Discharge: Condition: Good, Destination: Home . AVS Declined  Performed by:  Rico Ala, LPN

## 2023-10-09 DIAGNOSIS — H33321 Round hole, right eye: Secondary | ICD-10-CM | POA: Diagnosis not present

## 2023-10-11 ENCOUNTER — Other Ambulatory Visit: Payer: Self-pay | Admitting: Family Medicine

## 2023-10-11 ENCOUNTER — Other Ambulatory Visit: Payer: Self-pay | Admitting: Internal Medicine

## 2023-10-12 ENCOUNTER — Other Ambulatory Visit: Payer: Self-pay

## 2023-10-12 ENCOUNTER — Other Ambulatory Visit (HOSPITAL_BASED_OUTPATIENT_CLINIC_OR_DEPARTMENT_OTHER): Payer: Self-pay

## 2023-10-12 MED ORDER — CELECOXIB 200 MG PO CAPS
200.0000 mg | ORAL_CAPSULE | Freq: Two times a day (BID) | ORAL | 1 refills | Status: DC
  Filled 2023-10-12: qty 60, 30d supply, fill #0
  Filled 2023-11-21: qty 60, 30d supply, fill #1

## 2023-10-12 MED ORDER — REPATHA SURECLICK 140 MG/ML ~~LOC~~ SOAJ
140.0000 mg | SUBCUTANEOUS | 2 refills | Status: DC
  Filled 2023-10-12: qty 2, 28d supply, fill #0
  Filled 2023-12-16: qty 2, 28d supply, fill #1
  Filled 2024-01-17: qty 2, 28d supply, fill #2

## 2023-10-12 NOTE — Telephone Encounter (Signed)
Please advise Sir, thank you. 

## 2023-10-25 ENCOUNTER — Other Ambulatory Visit (HOSPITAL_BASED_OUTPATIENT_CLINIC_OR_DEPARTMENT_OTHER): Payer: Self-pay

## 2023-10-25 ENCOUNTER — Other Ambulatory Visit: Payer: Self-pay | Admitting: Family Medicine

## 2023-10-25 DIAGNOSIS — H43813 Vitreous degeneration, bilateral: Secondary | ICD-10-CM | POA: Diagnosis not present

## 2023-10-25 DIAGNOSIS — H35371 Puckering of macula, right eye: Secondary | ICD-10-CM | POA: Diagnosis not present

## 2023-10-25 DIAGNOSIS — H43391 Other vitreous opacities, right eye: Secondary | ICD-10-CM | POA: Diagnosis not present

## 2023-10-25 DIAGNOSIS — H04123 Dry eye syndrome of bilateral lacrimal glands: Secondary | ICD-10-CM | POA: Diagnosis not present

## 2023-10-25 DIAGNOSIS — H40053 Ocular hypertension, bilateral: Secondary | ICD-10-CM | POA: Diagnosis not present

## 2023-10-25 LAB — HM DIABETES EYE EXAM

## 2023-10-25 MED ORDER — ERYTHROMYCIN 5 MG/GM OP OINT
1.0000 | TOPICAL_OINTMENT | Freq: Every day | OPHTHALMIC | 11 refills | Status: DC
  Filled 2023-10-25: qty 3.5, 3d supply, fill #0

## 2023-10-25 MED ORDER — TECHLITE PEN NEEDLES 32G X 6 MM MISC
1.0000 | Freq: Every day | 1 refills | Status: DC
  Filled 2023-10-25: qty 100, 90d supply, fill #0
  Filled 2024-02-12: qty 100, 90d supply, fill #1

## 2023-11-02 ENCOUNTER — Other Ambulatory Visit (HOSPITAL_BASED_OUTPATIENT_CLINIC_OR_DEPARTMENT_OTHER): Payer: Self-pay

## 2023-11-02 MED ORDER — COVID-19 MRNA VAC-TRIS(PFIZER) 30 MCG/0.3ML IM SUSY
0.3000 mL | PREFILLED_SYRINGE | Freq: Once | INTRAMUSCULAR | 0 refills | Status: AC
  Filled 2023-11-02: qty 0.3, 1d supply, fill #0

## 2023-11-05 DIAGNOSIS — D2272 Melanocytic nevi of left lower limb, including hip: Secondary | ICD-10-CM | POA: Diagnosis not present

## 2023-11-05 DIAGNOSIS — L570 Actinic keratosis: Secondary | ICD-10-CM | POA: Diagnosis not present

## 2023-11-05 DIAGNOSIS — D1801 Hemangioma of skin and subcutaneous tissue: Secondary | ICD-10-CM | POA: Diagnosis not present

## 2023-11-05 DIAGNOSIS — C4330 Malignant melanoma of unspecified part of face: Secondary | ICD-10-CM | POA: Diagnosis not present

## 2023-11-05 DIAGNOSIS — Z85828 Personal history of other malignant neoplasm of skin: Secondary | ICD-10-CM | POA: Diagnosis not present

## 2023-11-05 DIAGNOSIS — L821 Other seborrheic keratosis: Secondary | ICD-10-CM | POA: Diagnosis not present

## 2023-11-05 DIAGNOSIS — Z85820 Personal history of malignant melanoma of skin: Secondary | ICD-10-CM | POA: Diagnosis not present

## 2023-11-05 DIAGNOSIS — D485 Neoplasm of uncertain behavior of skin: Secondary | ICD-10-CM | POA: Diagnosis not present

## 2023-11-05 DIAGNOSIS — D2271 Melanocytic nevi of right lower limb, including hip: Secondary | ICD-10-CM | POA: Diagnosis not present

## 2023-11-05 DIAGNOSIS — L814 Other melanin hyperpigmentation: Secondary | ICD-10-CM | POA: Diagnosis not present

## 2023-11-05 DIAGNOSIS — L820 Inflamed seborrheic keratosis: Secondary | ICD-10-CM | POA: Diagnosis not present

## 2023-11-20 ENCOUNTER — Ambulatory Visit: Payer: Commercial Managed Care - PPO

## 2023-11-21 ENCOUNTER — Other Ambulatory Visit (HOSPITAL_BASED_OUTPATIENT_CLINIC_OR_DEPARTMENT_OTHER): Payer: Self-pay

## 2023-11-21 DIAGNOSIS — E1165 Type 2 diabetes mellitus with hyperglycemia: Secondary | ICD-10-CM | POA: Diagnosis not present

## 2023-11-21 DIAGNOSIS — H35362 Drusen (degenerative) of macula, left eye: Secondary | ICD-10-CM | POA: Diagnosis not present

## 2023-11-21 DIAGNOSIS — K5080 Crohn's disease of both small and large intestine without complications: Secondary | ICD-10-CM | POA: Diagnosis not present

## 2023-11-21 DIAGNOSIS — H33321 Round hole, right eye: Secondary | ICD-10-CM | POA: Diagnosis not present

## 2023-11-21 DIAGNOSIS — H25043 Posterior subcapsular polar age-related cataract, bilateral: Secondary | ICD-10-CM | POA: Diagnosis not present

## 2023-11-21 DIAGNOSIS — H43813 Vitreous degeneration, bilateral: Secondary | ICD-10-CM | POA: Diagnosis not present

## 2023-11-21 DIAGNOSIS — H2513 Age-related nuclear cataract, bilateral: Secondary | ICD-10-CM | POA: Diagnosis not present

## 2023-11-21 DIAGNOSIS — H401121 Primary open-angle glaucoma, left eye, mild stage: Secondary | ICD-10-CM | POA: Diagnosis not present

## 2023-11-21 DIAGNOSIS — H16223 Keratoconjunctivitis sicca, not specified as Sjogren's, bilateral: Secondary | ICD-10-CM | POA: Diagnosis not present

## 2023-11-21 DIAGNOSIS — H25011 Cortical age-related cataract, right eye: Secondary | ICD-10-CM | POA: Diagnosis not present

## 2023-11-21 MED ORDER — PREDNISOLONE ACETATE 1 % OP SUSP
1.0000 [drp] | Freq: Four times a day (QID) | OPHTHALMIC | 0 refills | Status: DC
  Filled 2023-11-21: qty 15, 75d supply, fill #0

## 2023-11-27 ENCOUNTER — Other Ambulatory Visit (HOSPITAL_BASED_OUTPATIENT_CLINIC_OR_DEPARTMENT_OTHER): Payer: Self-pay

## 2023-11-29 ENCOUNTER — Ambulatory Visit: Payer: Commercial Managed Care - PPO

## 2023-11-29 VITALS — BP 127/80 | HR 62 | Temp 98.5°F | Resp 18 | Ht 72.0 in | Wt 191.2 lb

## 2023-11-29 DIAGNOSIS — K50818 Crohn's disease of both small and large intestine with other complication: Secondary | ICD-10-CM | POA: Diagnosis not present

## 2023-11-29 MED ORDER — VEDOLIZUMAB 300 MG IV SOLR
300.0000 mg | Freq: Once | INTRAVENOUS | Status: AC
Start: 2023-11-29 — End: 2023-11-29
  Administered 2023-11-29: 300 mg via INTRAVENOUS
  Filled 2023-11-29: qty 5

## 2023-11-29 NOTE — Progress Notes (Signed)
Diagnosis: Crohn's disease of both small and large intestine with other complication   Provider:  Chilton Greathouse MD  Procedure: IV Infusion  IV Type: Peripheral, IV Location: L Forearm  Entyvio (Vedolizumab), Dose: 300 mg  Infusion Start Time: 1532  Infusion Stop Time: 1606  Post Infusion IV Care: Patient declined observation and Peripheral IV Discontinued  Discharge: Condition: Stable, Destination: Home . AVS Declined  Performed by:  Wyvonne Lenz, RN

## 2023-11-30 ENCOUNTER — Other Ambulatory Visit (HOSPITAL_BASED_OUTPATIENT_CLINIC_OR_DEPARTMENT_OTHER): Payer: Self-pay

## 2023-11-30 DIAGNOSIS — D0339 Melanoma in situ of other parts of face: Secondary | ICD-10-CM | POA: Diagnosis not present

## 2023-11-30 MED ORDER — MUPIROCIN 2 % EX OINT
1.0000 | TOPICAL_OINTMENT | Freq: Every day | CUTANEOUS | 0 refills | Status: DC
  Filled 2023-11-30: qty 22, 22d supply, fill #0

## 2023-11-30 MED ORDER — AZITHROMYCIN 250 MG PO TABS
ORAL_TABLET | ORAL | 0 refills | Status: DC
  Filled 2023-11-30: qty 6, 5d supply, fill #0

## 2023-12-17 ENCOUNTER — Other Ambulatory Visit (HOSPITAL_BASED_OUTPATIENT_CLINIC_OR_DEPARTMENT_OTHER): Payer: Self-pay

## 2023-12-17 DIAGNOSIS — H2512 Age-related nuclear cataract, left eye: Secondary | ICD-10-CM | POA: Diagnosis not present

## 2023-12-17 DIAGNOSIS — H401121 Primary open-angle glaucoma, left eye, mild stage: Secondary | ICD-10-CM | POA: Diagnosis not present

## 2023-12-17 DIAGNOSIS — H25812 Combined forms of age-related cataract, left eye: Secondary | ICD-10-CM | POA: Diagnosis not present

## 2023-12-19 NOTE — Progress Notes (Addendum)
 [facility] [address] [place], [place] [address] [phone]  Date:  12/20/2023   Name:  Thomas Peterson   DOB:  Oct 01, 1961   MRN:  [id number]  PCP:  Watt Harlene BROCKS, MD    Chief Complaint: Medication Problem (Concerns/ questions: pt says he is going on Medicare in a couple of months and would like to have some labs and discuss some med changes. /FYI: recent Catoract surgery, and Melanoma surgery. /Flu shot today: received at work/Foot exam and A1c are due)   History of Present Illness:  Thomas Peterson is a 63 y.o. very pleasant male patient who presents with the following:  Patient seen today to follow-up on medications and discuss some future planning Most recent visit with myself was in May He plans to partially retire and go on Medicare in March-he is looking at various supplementary health plans History of Crohn's disease, diabetes, hyperlipidemia, hypertension, skin cancer-melanoma  Dx with DM at about age 77   Crohn's disease is managed by Dr. Avram, he does bimonthly infusions with Entyvio  which has cut down his steroid use.  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  He continues to use his timolol  drops  He needed a melanoma resection of his left lateral/posterior neck a few weeks ago  He may notice sx of low blood sugar overnight - he does not tend to check his glucose but may be running 60-80 when he does check it.  This is low enough to cause some symptoms  He recently had 1 cataract removed, the second is scheduled for February  Amlodipine  10 Entocort as needed Celebrex  as needed Farxiga  10 Repatha  Actos  45 Valsartan /hydrochlorothiazide  effexor  Prednisone, timolol  eyedrops Basaglar  30 units daily  Lab Results  Component Value Date   HGBA1C 7.7 (H) 04/16/2023   Foot exam is due Flu shot-already  done Update A1c COVID booster is up-to-date  Patient Active Problem List   Diagnosis Date Noted   Long-term use of immunosuppressant medication- Entyvio  09/17/2018   Low back pain 06/04/2018   Controlled type 2 diabetes mellitus with complication, with long-term current use of insulin  (HCC) 01/29/2017   Essential hypertension 01/29/2017   Crohn's ileocolitis (HCC) 01/29/2017   Mixed hyperlipidemia 01/29/2017   Leg cramps 01/29/2017   History of skin cancer 01/29/2017    Past Medical History:  Diagnosis Date   Arthritis 2020   left hand   Crohn's disease without complication (HCC) 01/29/2017   Diabetes mellitus type 2 with complications (HCC)    Diabetes mellitus without complication (HCC)    Dyslipidemia    Glaucoma    bilateral   History of basal cell carcinoma    History of melanoma    History of mood disorder    Hyperlipidemia    Hypertension    Lower back pain     Past Surgical History:  Procedure Laterality Date   COLONOSCOPY     KNEE ARTHROSCOPY Right 1999   MELANOMA EXCISION  1999   and squamous and basal cell excisions   TONSILLECTOMY  1965    Social History   Tobacco Use   Smoking status: Former    Current packs/day: 0.00    Types: Cigarettes    Start date: 7    Quit date: 1995    Years since quitting: 30.0    Passive exposure: Never  Smokeless tobacco: Never   Tobacco comments:    Off and on smoker   Vaping Use   Vaping status: Never Used  Substance Use Topics   Alcohol use: Yes    Comment: occasional-1-2 per week   Drug use: Never    Family History  Problem Relation Age of Onset   Breast cancer Mother    Heart attack Mother    Eczema Brother    Cancer Brother        type unknown, mets   Diabetes Brother    Rheum arthritis Paternal Aunt    Crohn's disease Paternal Uncle    Food Allergy  Daughter    Stomach cancer Neg Hx    Colon cancer Neg Hx    Esophageal cancer Neg Hx    Asthma Neg Hx    Immunodeficiency Neg Hx     Angioedema Neg Hx    Atopy Neg Hx     Allergies  Allergen Reactions   Sulfa Antibiotics Anaphylaxis    I got these purple spots everywhere    Ciprofloxacin    Flagyl [Metronidazole]     GI effects    Metformin  And Related     Worsens his crohn disease sx, diarrhea    Statins Other (See Comments)    Leg cramps   Mesalamine  Other (See Comments)    Gastrointestinal distress    Medication list has been reviewed and updated.  Current Outpatient Medications on File Prior to Visit  Medication Sig Dispense Refill   amLODipine  (NORVASC ) 10 MG tablet Take 1 tablet (10 mg total) by mouth daily. 90 tablet 3   Boswellia Serrata (BOSWELLIA PO) Take 1 tablet by mouth 2 (two) times daily.     budesonide  (ENTOCORT EC ) 3 MG 24 hr capsule Take 1 capsule (3 mg total) by mouth daily. 90 capsule 0   celecoxib  (CELEBREX ) 200 MG capsule Take 1 capsule (200 mg total) by mouth 2 (two) times daily. 60 capsule 1   Cholecalciferol (VITAMIN D3 PO) Take by mouth. 2500 iu daily     dapagliflozin  propanediol (FARXIGA ) 10 MG TABS tablet Take 1 tablet (10 mg total) by mouth daily. 90 tablet 3   erythromycin  ophthalmic ointment Apply 0.5 inche ribbon to both eyes at bedtime. (Patient not taking: Reported on 12/20/2023) 3.5 g 11   Evolocumab  (REPATHA  SURECLICK) 140 MG/ML SOAJ Inject 140 mg into the skin every 14 (fourteen) days. 2 mL 2   fluticasone  (FLONASE ) 50 MCG/ACT nasal spray Place 1 spray into both nostrils daily. 16 g 5   Insulin  Glargine (BASAGLAR  KWIKPEN) 100 UNIT/ML Inject 30 Units into the skin daily. 15 mL 3   Insulin  Pen Needle (TECHLITE PEN NEEDLES) 32G X 6 MM MISC Use once daily as directed. 100 each 1   ipratropium (ATROVENT ) 0.03 % nasal spray Place 2 sprays into both nostrils 3 (three) times daily as needed for nasal drainage 30 mL 12   Multiple Vitamin (MULTIVITAMIN) tablet Take 1 tablet by mouth daily.     Omega-3 Fatty Acids (FISH OIL PO) Take 4 g by mouth daily.     omeprazole  (PRILOSEC) 40 MG  capsule Take 1 capsule (40 mg total) by mouth daily. 90 capsule 3   pioglitazone  (ACTOS ) 45 MG tablet Take 1 tablet (45 mg total) by mouth daily. 90 tablet 3   prednisoLONE  acetate (PRED FORTE ) 1 % ophthalmic suspension Start use after surgery, place 1 drop in left eye 4 times daily 15 mL 0   Probiotic Product (VSL#3 PO)  Take by mouth as needed.     sodium chloride  0.9 % SOLN 250 mL with vedolizumab  300 MG SOLR 300 mg Inject 300 mg into the vein every 8 (eight) weeks.     timolol  (TIMOPTIC ) 0.5 % ophthalmic solution Place 1 drop into both eyes 2 (two) times daily. 10 mL 11   valsartan -hydrochlorothiazide  (DIOVAN -HCT) 320-25 MG tablet Take 1 tablet by mouth daily. 90 tablet 3   venlafaxine  XR (EFFEXOR -XR) 37.5 MG 24 hr capsule Take 1 capsule (37.5 mg total) by mouth daily with breakfast. 90 capsule 3   mupirocin  ointment (BACTROBAN ) 2 % Apply 1 small Application topically daily with bandage changes (Patient not taking: Reported on 12/20/2023) 22 g 0   No current facility-administered medications on file prior to visit.    Review of Systems:  As per HPI- otherwise negative.   Physical Examination: Vitals:   12/20/23 1342  BP: 110/76  Pulse: 80  Resp: 18  Temp: 97.9 F (36.6 C)  SpO2: 98%   Vitals:   12/20/23 1342  Weight: 187 lb 6.4 oz (85 kg)  Height: 5' 11 (1.803 m)   Body mass index is 26.14 kg/m. Ideal Body Weight: Weight in (lb) to have BMI = 25: 178.9  GEN: no acute distress.  Minimal overweight, looks well HEENT: Atraumatic, Normocephalic.  Ears and Nose: No external deformity. CV: RRR, No M/G/R. No JVD. No thrill. No extra heart sounds. PULM: CTA B, no wheezes, crackles, rhonchi. No retractions. No resp. distress. No accessory muscle use. ABD: S, NT, ND EXTR: No c/c/e PSYCH: Normally interactive. Conversant.    Assessment and Plan: Controlled type 2 diabetes mellitus with complication, with long-term current use of insulin  (HCC) - Plan: Dulaglutide  (TRULICITY )  0.75 MG/0.5ML SOAJ, CBC, Comprehensive metabolic panel, Hemoglobin A1c  Mixed hyperlipidemia - Plan: Lipid panel  Essential hypertension - Plan: CBC  Crohn's disease of both small and large intestine with other complication (HCC)  Special screening, prostate cancer - Plan: PSA  Patient seen today for diabetes follow-up.  He is concerned about the cost of some of his medications when he changes to a Medicare plan.  Also, he is having some mild low blood sugars that do cause symptoms of hypoglycemia.  We wonder if we can get him off of insulin  or at least on a lower dose.  I will have him start Trulicity  0.75 for 1 month, can increase to 1.5 assuming he is tolerating well.  Will have him drop Basaglar  back to 25 units and also stop Actos -he will let me know how his blood sugar responds  He notes Farxiga  is also expensive, as we titrate him up on Trulicity  we may be able to drop Farxiga   Signed Harlene Schroeder, MD  When labs come in, double check with patient about family history of cancers as pertains to GLP-1 use  1/10- received labs and sent message to pt  Results for orders placed or performed in visit on 12/20/23  CBC   Collection Time: 12/20/23  2:18 PM  Result Value Ref Range   WBC 8.4 4.0 - 10.5 K/uL   RBC 5.41 4.22 - 5.81 Mil/uL   Platelets 366.0 150.0 - 400.0 K/uL   Hemoglobin 16.7 13.0 - 17.0 g/dL   HCT 49.9 60.9 - 47.9 %   MCV 92.5 78.0 - 100.0 fl   MCHC 33.4 30.0 - 36.0 g/dL   RDW 85.5 88.4 - 84.4 %  Comprehensive metabolic panel   Collection Time: 12/20/23  2:18 PM  Result  Value Ref Range   Sodium 137 135 - 145 mEq/L   Potassium 4.1 3.5 - 5.1 mEq/L   Chloride 96 96 - 112 mEq/L   CO2 30 19 - 32 mEq/L   Glucose, Bld 70 70 - 99 mg/dL   BUN 29 (H) 6 - 23 mg/dL   Creatinine, Ser 8.63 0.40 - 1.50 mg/dL   Total Bilirubin 0.8 0.2 - 1.2 mg/dL   Alkaline Phosphatase 55 39 - 117 U/L   AST 27 0 - 37 U/L   ALT 24 0 - 53 U/L   Total Protein 7.1 6.0 - 8.3 g/dL   Albumin  4.5 3.5 - 5.2 g/dL   GFR 45.11 (L) >39.99 mL/min   Calcium 9.2 8.4 - 10.5 mg/dL  Hemoglobin J8r   Collection Time: 12/20/23  2:18 PM  Result Value Ref Range   Hgb A1c MFr Bld 7.8 (H) 4.6 - 6.5 %  PSA   Collection Time: 12/20/23  2:18 PM  Result Value Ref Range   PSA 1.27 0.10 - 4.00 ng/mL  Lipid panel   Collection Time: 12/20/23  2:18 PM  Result Value Ref Range   Cholesterol 245 (H) 0 - 200 mg/dL   Triglycerides 788.9 (H) 0.0 - 149.0 mg/dL   HDL 59.19 >60.99 mg/dL   VLDL 57.7 (H) 0.0 - 59.9 mg/dL   LDL Cholesterol 837 (H) 0 - 99 mg/dL   Total CHOL/HDL Ratio 6    NonHDL 204.20

## 2023-12-20 ENCOUNTER — Ambulatory Visit: Payer: Commercial Managed Care - PPO | Admitting: Family Medicine

## 2023-12-20 ENCOUNTER — Other Ambulatory Visit (HOSPITAL_BASED_OUTPATIENT_CLINIC_OR_DEPARTMENT_OTHER): Payer: Self-pay

## 2023-12-20 VITALS — BP 110/76 | HR 80 | Temp 97.9°F | Resp 18 | Ht 71.0 in | Wt 187.4 lb

## 2023-12-20 DIAGNOSIS — I10 Essential (primary) hypertension: Secondary | ICD-10-CM | POA: Diagnosis not present

## 2023-12-20 DIAGNOSIS — Z794 Long term (current) use of insulin: Secondary | ICD-10-CM | POA: Diagnosis not present

## 2023-12-20 DIAGNOSIS — E782 Mixed hyperlipidemia: Secondary | ICD-10-CM | POA: Diagnosis not present

## 2023-12-20 DIAGNOSIS — K50818 Crohn's disease of both small and large intestine with other complication: Secondary | ICD-10-CM

## 2023-12-20 DIAGNOSIS — E118 Type 2 diabetes mellitus with unspecified complications: Secondary | ICD-10-CM

## 2023-12-20 DIAGNOSIS — Z125 Encounter for screening for malignant neoplasm of prostate: Secondary | ICD-10-CM | POA: Diagnosis not present

## 2023-12-20 MED ORDER — TRULICITY 0.75 MG/0.5ML ~~LOC~~ SOAJ
0.7500 mg | SUBCUTANEOUS | 1 refills | Status: DC
  Filled 2023-12-20: qty 2, 28d supply, fill #0

## 2023-12-20 NOTE — Patient Instructions (Signed)
 It was good to see you today, I will be in touch with your labs as soon as possible  Lets have you start on Trulicity  0.75 mg daily.  We can go up to 1.5 in a month assuming you tolerate it well.  Most common side effects would be nausea and constipation.  When you start the Trulicity  scale your Basaglar  back to 25 units. You can also stop taking Actos , but just monitor for any high blood sugars.  Please let me know how you do with the Trulicity , assuming you do well and we increased to a higher dosage I suspect we can go down further on Basaglar  and potentially also stop Farxiga 

## 2023-12-21 ENCOUNTER — Encounter: Payer: Self-pay | Admitting: Family Medicine

## 2023-12-21 ENCOUNTER — Other Ambulatory Visit: Payer: Self-pay

## 2023-12-21 ENCOUNTER — Telehealth: Payer: Self-pay

## 2023-12-21 DIAGNOSIS — E782 Mixed hyperlipidemia: Secondary | ICD-10-CM

## 2023-12-21 DIAGNOSIS — E118 Type 2 diabetes mellitus with unspecified complications: Secondary | ICD-10-CM

## 2023-12-21 LAB — CBC
HCT: 50 % (ref 39.0–52.0)
Hemoglobin: 16.7 g/dL (ref 13.0–17.0)
MCHC: 33.4 g/dL (ref 30.0–36.0)
MCV: 92.5 fL (ref 78.0–100.0)
Platelets: 366 10*3/uL (ref 150.0–400.0)
RBC: 5.41 Mil/uL (ref 4.22–5.81)
RDW: 14.4 % (ref 11.5–15.5)
WBC: 8.4 10*3/uL (ref 4.0–10.5)

## 2023-12-21 LAB — COMPREHENSIVE METABOLIC PANEL
ALT: 24 U/L (ref 0–53)
AST: 27 U/L (ref 0–37)
Albumin: 4.5 g/dL (ref 3.5–5.2)
Alkaline Phosphatase: 55 U/L (ref 39–117)
BUN: 29 mg/dL — ABNORMAL HIGH (ref 6–23)
CO2: 30 meq/L (ref 19–32)
Calcium: 9.2 mg/dL (ref 8.4–10.5)
Chloride: 96 meq/L (ref 96–112)
Creatinine, Ser: 1.36 mg/dL (ref 0.40–1.50)
GFR: 54.88 mL/min — ABNORMAL LOW (ref >60.00)
Glucose, Bld: 70 mg/dL (ref 70–99)
Potassium: 4.1 meq/L (ref 3.5–5.1)
Sodium: 137 meq/L (ref 135–145)
Total Bilirubin: 0.8 mg/dL (ref 0.2–1.2)
Total Protein: 7.1 g/dL (ref 6.0–8.3)

## 2023-12-21 LAB — LIPID PANEL
Cholesterol: 245 mg/dL — ABNORMAL HIGH (ref 0–200)
HDL: 40.8 mg/dL (ref >39.00)
LDL Cholesterol: 162 mg/dL — ABNORMAL HIGH (ref 0–99)
NonHDL: 204.2
Total CHOL/HDL Ratio: 6
Triglycerides: 211 mg/dL — ABNORMAL HIGH (ref 0.0–149.0)
VLDL: 42.2 mg/dL — ABNORMAL HIGH (ref 0.0–40.0)

## 2023-12-21 LAB — HEMOGLOBIN A1C: Hgb A1c MFr Bld: 7.8 % — ABNORMAL HIGH (ref 4.6–6.5)

## 2023-12-21 LAB — PSA: PSA: 1.27 ng/mL (ref 0.10–4.00)

## 2023-12-21 NOTE — Telephone Encounter (Signed)
 Auth Submission: APPROVED - Renewal Site of care: Site of care: [facility] Payer: [employer] Medication & CPT/J Code(s) submitted: Entyvio  (Vedolizumab ) J3380 Route of submission (phone, fax, portal): Portal - [employer] Phone # Fax # Auth type: Buy/Bill PB Units/visits requested: 300mg  x 7 doses Reference number: [id number] Approval from: 12/20/23 to 12/18/24

## 2023-12-25 ENCOUNTER — Other Ambulatory Visit (HOSPITAL_BASED_OUTPATIENT_CLINIC_OR_DEPARTMENT_OTHER): Payer: Self-pay

## 2023-12-25 DIAGNOSIS — H5213 Myopia, bilateral: Secondary | ICD-10-CM | POA: Diagnosis not present

## 2023-12-25 DIAGNOSIS — E1165 Type 2 diabetes mellitus with hyperglycemia: Secondary | ICD-10-CM | POA: Diagnosis not present

## 2023-12-25 DIAGNOSIS — H401111 Primary open-angle glaucoma, right eye, mild stage: Secondary | ICD-10-CM | POA: Diagnosis not present

## 2023-12-25 DIAGNOSIS — H33321 Round hole, right eye: Secondary | ICD-10-CM | POA: Diagnosis not present

## 2023-12-25 DIAGNOSIS — H2511 Age-related nuclear cataract, right eye: Secondary | ICD-10-CM | POA: Diagnosis not present

## 2023-12-25 DIAGNOSIS — K5080 Crohn's disease of both small and large intestine without complications: Secondary | ICD-10-CM | POA: Diagnosis not present

## 2023-12-25 DIAGNOSIS — H25011 Cortical age-related cataract, right eye: Secondary | ICD-10-CM | POA: Diagnosis not present

## 2023-12-25 MED ORDER — PREDNISOLONE ACETATE 1 % OP SUSP
1.0000 [drp] | Freq: Four times a day (QID) | OPHTHALMIC | 0 refills | Status: DC
  Filled 2023-12-25: qty 10, 50d supply, fill #0

## 2023-12-28 ENCOUNTER — Other Ambulatory Visit (HOSPITAL_BASED_OUTPATIENT_CLINIC_OR_DEPARTMENT_OTHER): Payer: Self-pay

## 2023-12-31 DIAGNOSIS — H0288B Meibomian gland dysfunction left eye, upper and lower eyelids: Secondary | ICD-10-CM | POA: Diagnosis not present

## 2023-12-31 DIAGNOSIS — H0288A Meibomian gland dysfunction right eye, upper and lower eyelids: Secondary | ICD-10-CM | POA: Diagnosis not present

## 2023-12-31 DIAGNOSIS — H04123 Dry eye syndrome of bilateral lacrimal glands: Secondary | ICD-10-CM | POA: Diagnosis not present

## 2024-01-02 ENCOUNTER — Encounter: Payer: Self-pay | Admitting: Family Medicine

## 2024-01-02 DIAGNOSIS — E118 Type 2 diabetes mellitus with unspecified complications: Secondary | ICD-10-CM

## 2024-01-07 DIAGNOSIS — H04122 Dry eye syndrome of left lacrimal gland: Secondary | ICD-10-CM | POA: Diagnosis not present

## 2024-01-07 DIAGNOSIS — H0288B Meibomian gland dysfunction left eye, upper and lower eyelids: Secondary | ICD-10-CM | POA: Diagnosis not present

## 2024-01-10 ENCOUNTER — Ambulatory Visit (HOSPITAL_BASED_OUTPATIENT_CLINIC_OR_DEPARTMENT_OTHER): Payer: Commercial Managed Care - PPO

## 2024-01-11 DIAGNOSIS — H25011 Cortical age-related cataract, right eye: Secondary | ICD-10-CM | POA: Diagnosis not present

## 2024-01-11 DIAGNOSIS — H04121 Dry eye syndrome of right lacrimal gland: Secondary | ICD-10-CM | POA: Diagnosis not present

## 2024-01-11 DIAGNOSIS — H2511 Age-related nuclear cataract, right eye: Secondary | ICD-10-CM | POA: Diagnosis not present

## 2024-01-14 DIAGNOSIS — H2511 Age-related nuclear cataract, right eye: Secondary | ICD-10-CM | POA: Diagnosis not present

## 2024-01-14 DIAGNOSIS — H401111 Primary open-angle glaucoma, right eye, mild stage: Secondary | ICD-10-CM | POA: Diagnosis not present

## 2024-01-14 DIAGNOSIS — H401121 Primary open-angle glaucoma, left eye, mild stage: Secondary | ICD-10-CM | POA: Diagnosis not present

## 2024-01-14 DIAGNOSIS — H25812 Combined forms of age-related cataract, left eye: Secondary | ICD-10-CM | POA: Diagnosis not present

## 2024-01-17 ENCOUNTER — Other Ambulatory Visit (HOSPITAL_BASED_OUTPATIENT_CLINIC_OR_DEPARTMENT_OTHER): Payer: Self-pay

## 2024-01-17 ENCOUNTER — Other Ambulatory Visit: Payer: Self-pay

## 2024-01-17 ENCOUNTER — Other Ambulatory Visit: Payer: Self-pay | Admitting: Family Medicine

## 2024-01-17 ENCOUNTER — Other Ambulatory Visit: Payer: Self-pay | Admitting: Internal Medicine

## 2024-01-17 DIAGNOSIS — Z7960 Long term (current) use of unspecified immunomodulators and immunosuppressants: Secondary | ICD-10-CM

## 2024-01-17 DIAGNOSIS — E118 Type 2 diabetes mellitus with unspecified complications: Secondary | ICD-10-CM

## 2024-01-17 DIAGNOSIS — K50818 Crohn's disease of both small and large intestine with other complication: Secondary | ICD-10-CM

## 2024-01-17 NOTE — Telephone Encounter (Signed)
 How many refills approved Sir? Thank you.

## 2024-01-18 ENCOUNTER — Encounter: Payer: Self-pay | Admitting: Family Medicine

## 2024-01-18 ENCOUNTER — Other Ambulatory Visit (HOSPITAL_BASED_OUTPATIENT_CLINIC_OR_DEPARTMENT_OTHER): Payer: Self-pay

## 2024-01-18 ENCOUNTER — Telehealth: Payer: Self-pay | Admitting: Neurology

## 2024-01-18 DIAGNOSIS — Z111 Encounter for screening for respiratory tuberculosis: Secondary | ICD-10-CM

## 2024-01-18 MED ORDER — CELECOXIB 200 MG PO CAPS
200.0000 mg | ORAL_CAPSULE | Freq: Two times a day (BID) | ORAL | 1 refills | Status: DC
  Filled 2024-01-18: qty 60, 30d supply, fill #0
  Filled 2024-03-10: qty 60, 30d supply, fill #1

## 2024-01-18 MED ORDER — BASAGLAR KWIKPEN 100 UNIT/ML ~~LOC~~ SOPN
30.0000 [IU] | PEN_INJECTOR | Freq: Every day | SUBCUTANEOUS | 3 refills | Status: DC
  Filled 2024-01-18: qty 15, 50d supply, fill #0
  Filled 2024-03-10: qty 15, 50d supply, fill #1

## 2024-01-18 MED ORDER — BUDESONIDE 3 MG PO CPEP
9.0000 mg | ORAL_CAPSULE | Freq: Every day | ORAL | 11 refills | Status: AC
  Filled 2024-01-18: qty 90, 30d supply, fill #0
  Filled 2024-12-14: qty 90, 30d supply, fill #1

## 2024-01-18 NOTE — Telephone Encounter (Signed)
 I refilled them  He is overdue for quantiferon which I ordered  Please have him do that and he also needs a next availabe appointment with me

## 2024-01-18 NOTE — Telephone Encounter (Signed)
 Done

## 2024-01-18 NOTE — Telephone Encounter (Signed)
 Thomas Peterson with Dr. Jennet Mode office [facility]) called to ask if you can add on a Quant TB Gold test to his labs on Monday? He has appt and didn't want to have labs twice. Thanks!

## 2024-01-18 NOTE — Telephone Encounter (Signed)
 I spoke with Thomas Peterson and booked him a March appointment as he is retiring at the end of March. He request to have his blood drawn in [facility], I called the lab there and they added it to the order to draw. I told him that his meds have been refilled.

## 2024-01-18 NOTE — Addendum Note (Signed)
 Addended by: Gates Kasal C on: 01/18/2024 05:02 PM   Modules accepted: Orders

## 2024-01-21 ENCOUNTER — Encounter: Payer: Self-pay | Admitting: Family Medicine

## 2024-01-21 ENCOUNTER — Other Ambulatory Visit (INDEPENDENT_AMBULATORY_CARE_PROVIDER_SITE_OTHER): Payer: Commercial Managed Care - PPO

## 2024-01-21 DIAGNOSIS — E118 Type 2 diabetes mellitus with unspecified complications: Secondary | ICD-10-CM

## 2024-01-21 DIAGNOSIS — Z111 Encounter for screening for respiratory tuberculosis: Secondary | ICD-10-CM

## 2024-01-21 DIAGNOSIS — E782 Mixed hyperlipidemia: Secondary | ICD-10-CM | POA: Diagnosis not present

## 2024-01-21 DIAGNOSIS — Z794 Long term (current) use of insulin: Secondary | ICD-10-CM | POA: Diagnosis not present

## 2024-01-21 LAB — BASIC METABOLIC PANEL
BUN: 16 mg/dL (ref 6–23)
CO2: 29 meq/L (ref 19–32)
Calcium: 8.7 mg/dL (ref 8.4–10.5)
Chloride: 101 meq/L (ref 96–112)
Creatinine, Ser: 1.15 mg/dL (ref 0.40–1.50)
GFR: 67.07 mL/min (ref >60.00)
Glucose, Bld: 135 mg/dL — ABNORMAL HIGH (ref 70–99)
Potassium: 3.6 meq/L (ref 3.5–5.1)
Sodium: 142 meq/L (ref 135–145)

## 2024-01-21 LAB — LIPID PANEL
Cholesterol: 145 mg/dL (ref 0–200)
HDL: 43.5 mg/dL (ref >39.00)
LDL Cholesterol: 66 mg/dL (ref 0–99)
NonHDL: 101.55
Total CHOL/HDL Ratio: 3
Triglycerides: 176 mg/dL — ABNORMAL HIGH (ref 0.0–149.0)
VLDL: 35.2 mg/dL (ref 0.0–40.0)

## 2024-01-23 ENCOUNTER — Encounter: Payer: Self-pay | Admitting: Internal Medicine

## 2024-01-23 ENCOUNTER — Encounter: Payer: Self-pay | Admitting: Family Medicine

## 2024-01-23 LAB — QUANTIFERON-TB GOLD PLUS
Mitogen-NIL: 7.96 [IU]/mL
NIL: 0.01 [IU]/mL
QuantiFERON-TB Gold Plus: NEGATIVE
TB1-NIL: 0 [IU]/mL
TB2-NIL: 0 [IU]/mL

## 2024-01-24 ENCOUNTER — Ambulatory Visit: Payer: Commercial Managed Care - PPO

## 2024-01-24 ENCOUNTER — Other Ambulatory Visit (HOSPITAL_COMMUNITY): Payer: Self-pay

## 2024-01-24 VITALS — BP 115/73 | HR 78 | Temp 97.4°F | Resp 18 | Ht 73.0 in | Wt 181.0 lb

## 2024-01-24 DIAGNOSIS — K50818 Crohn's disease of both small and large intestine with other complication: Secondary | ICD-10-CM

## 2024-01-24 MED ORDER — VEDOLIZUMAB 300 MG IV SOLR
300.0000 mg | Freq: Once | INTRAVENOUS | Status: AC
  Administered 2024-01-24: 300 mg via INTRAVENOUS
  Filled 2024-01-24: qty 5

## 2024-01-24 NOTE — Progress Notes (Signed)
Diagnosis: Crohn's Disease  Provider:  Chilton Greathouse MD  Procedure: IV Infusion  IV Type: Peripheral, IV Location: R Antecubital  Entyvio (Vedolizumab), Dose: 300 mg  Infusion Start Time: 1538  Infusion Stop Time: 1612  Post Infusion IV Care: Peripheral IV Discontinued  Discharge: Condition: Good, Destination: Home . AVS Declined  Performed by:  Loney Hering, LPN

## 2024-01-28 MED ORDER — TIRZEPATIDE 2.5 MG/0.5ML ~~LOC~~ SOAJ
2.5000 mg | SUBCUTANEOUS | Status: DC
Start: 2024-01-28 — End: 2024-02-12

## 2024-01-28 NOTE — Addendum Note (Signed)
Addended by: Abbe Amsterdam C on: 01/28/2024 12:43 PM   Modules accepted: Orders

## 2024-02-04 ENCOUNTER — Other Ambulatory Visit: Payer: Self-pay | Admitting: Family Medicine

## 2024-02-12 ENCOUNTER — Other Ambulatory Visit (HOSPITAL_BASED_OUTPATIENT_CLINIC_OR_DEPARTMENT_OTHER): Payer: Self-pay

## 2024-02-12 ENCOUNTER — Other Ambulatory Visit: Payer: Self-pay

## 2024-02-12 MED ORDER — TIRZEPATIDE 5 MG/0.5ML ~~LOC~~ SOAJ
5.0000 mg | SUBCUTANEOUS | 2 refills | Status: DC
Start: 2024-02-12 — End: 2024-03-13
  Filled 2024-02-12: qty 2, 28d supply, fill #0

## 2024-02-12 NOTE — Addendum Note (Signed)
 Addended by: Abbe Amsterdam C on: 02/12/2024 02:15 PM   Modules accepted: Orders

## 2024-02-14 ENCOUNTER — Ambulatory Visit: Payer: Commercial Managed Care - PPO | Admitting: Internal Medicine

## 2024-02-18 ENCOUNTER — Telehealth: Payer: Self-pay | Admitting: Internal Medicine

## 2024-02-18 NOTE — Telephone Encounter (Signed)
 Patient called to reschedule an appt with Dr. Leone Payor that the office had to cancel. Please assist the patient is requesting to just see the doctor.

## 2024-02-19 NOTE — Telephone Encounter (Signed)
 Spoke with the pt and discussed setting up another appt after his last was cancelled due to covering the hospital. Appt made for 4/16 with Dr Leone Payor. The pt has been advised of the information and verbalized understanding.

## 2024-02-28 DIAGNOSIS — H524 Presbyopia: Secondary | ICD-10-CM | POA: Diagnosis not present

## 2024-02-28 DIAGNOSIS — H52223 Regular astigmatism, bilateral: Secondary | ICD-10-CM | POA: Diagnosis not present

## 2024-03-06 ENCOUNTER — Encounter: Payer: Self-pay | Admitting: Family Medicine

## 2024-03-06 ENCOUNTER — Ambulatory Visit: Payer: Self-pay | Admitting: Family Medicine

## 2024-03-06 NOTE — Telephone Encounter (Signed)
 Copied from CRM [id number]. Topic: Clinical - Red Word Triage >> Mar 06, 2024  2:18 PM [place] E wrote: Reason for Red Word Triage: Patient has been having worsening pain in his bones and joints, not in a specific area, but all over. Along with this, patient is having constipation issues. Worsening pain has been going on for the past week.  Chief Complaint: Pain Symptoms: Constipation Frequency: A week Pertinent Negatives: Patient denies fever Disposition: [] ED /[] Urgent Care (no appt availability in office) / [x] Appointment(In office/virtual)/ []  [facility]/ [] Home Care/ [] Refused Recommended Disposition /[] [facility]/ [x]  Follow-up with PCP Additional Notes: Patient called in to report generalized body pain. Patient stated he aches "all over". Patient stated the pain is moderate and constant. Patient stated pain started about week ago. Patient is unsure if this is related to recent medication changes or arthritis. Patient stated he is also experiencing constipation, related to one of his medications. Patient denied fever, chest pain, weakness and rash. This RN advised patient to to see provider in office before the weekend, per protocol. No availability with PCP until next week. Patient declined appointment with additional providers. Patient is requesting a call back to scheduled with PCP. This RN advised patient to call back if symptoms worsen. Patient complied.   Reason for Disposition  Diabetes mellitus or weak immune system (e.g., HIV positive, cancer chemo, splenectomy, organ transplant, chronic steroids)  Answer Assessment - Initial Assessment Questions 1. ONSET: "When did the muscle aches or body pains start?"      About a week 2. LOCATION: "What part of your body is hurting?" (e.g., entire body, arms, legs)      States pain is "all over", unable to localize 3. SEVERITY: "How bad is the pain?" (Scale 1-10; or mild, moderate, severe)   - MILD (1-3): doesn't  interfere with normal activities    - MODERATE (4-7): interferes with normal activities or awakens from sleep    - SEVERE (8-10):  excruciating pain, unable to do any normal activities      "Annoying", rates pain 5-6, constant, dull and achy 4. CAUSE: "What do you think is causing the pains?"     Unknown, possibly medication or arthritis 5. FEVER: "Have you been having fever?"     Denies 6. OTHER SYMPTOMS: "Do you have any other symptoms?" (e.g., chest pain, weakness, rash, cold or flu symptoms, weight loss)     Constipation, states voice is hoarse, denies chest pain, denies weakness, denies rash  Protocols used: Muscle Aches and Body Pain-A-AH

## 2024-03-06 NOTE — Telephone Encounter (Signed)
 Called and [facility], will send mychart

## 2024-03-06 NOTE — Telephone Encounter (Signed)
 Pt sees GI as well and has a pending appt on 03/26/24 with them. Please advise.

## 2024-03-10 ENCOUNTER — Other Ambulatory Visit (HOSPITAL_BASED_OUTPATIENT_CLINIC_OR_DEPARTMENT_OTHER): Payer: Self-pay

## 2024-03-10 ENCOUNTER — Other Ambulatory Visit: Payer: Self-pay | Admitting: Family Medicine

## 2024-03-10 ENCOUNTER — Other Ambulatory Visit: Payer: Self-pay

## 2024-03-10 MED ORDER — REPATHA SURECLICK 140 MG/ML ~~LOC~~ SOAJ
140.0000 mg | SUBCUTANEOUS | 2 refills | Status: DC
  Filled 2024-03-10: qty 2, 28d supply, fill #0
  Filled 2024-05-05: qty 2, 28d supply, fill #1
  Filled 2024-06-25: qty 2, 28d supply, fill #2

## 2024-03-11 NOTE — Progress Notes (Unsigned)
 [facility] at [facility] [address] [place], [place] [address] [phone]  Date:  03/13/2024   Name:  Thomas Peterson   DOB:  03-Aug-1961   MRN:  [id number]  PCP:  Thomas Cables, MD    Chief Complaint: No chief complaint on file.   History of Present Illness:  Thomas Peterson is a 63 y.o. 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 What I am hoping to address is 1) Now that I am managing the constipation (sort of), would it make sense to revert to Trulicity? Is it better in some way? Apart from constipation, I may have tolerated it a little better. 2) I am still on 22 U Basaglar, if I will still need some insulin product, can we switch to something else that is covered by insurance? Most morning BS readings are 110 - 130, a little higher near the end of the Belva period. 3) Can we check A1c?   Amlodipine 10 Celebrex Farxiga 10 Repatha Basaglar once daily Actos 45 Mounjaro 5 Valsartan/HCTZ Venlafaxine  Colon cancer screening-colonoscopy 2023, Dr. Leone Peterson  We switched her from Trulicity to [facility] due to excessive constipation symptoms on Trulicity a while A1c remained above goal Lab Results  Component Value Date   HGBA1C 7.8 (H) 12/20/2023   Recent lab work on chart including CMP, lipid-much improved with Repatha, CBC, PSA  Lab Results  Component Value Date   PSA 1.27 12/20/2023   PSA 1.10 04/16/2023   PSA 1.19 09/20/2022        Patient Active Problem List   Diagnosis Date Noted   Long-term use of immunosuppressant medication- Entyvio 09/17/2018   Low back pain 06/04/2018   Controlled type 2 diabetes mellitus with complication, with  long-term current use of insulin (HCC) 01/29/2017   Essential hypertension 01/29/2017   Crohn's ileocolitis (HCC) 01/29/2017   Mixed hyperlipidemia 01/29/2017   Leg cramps 01/29/2017   History of skin cancer 01/29/2017    Past Medical History:  Diagnosis Date   Arthritis 2020   left hand   Crohn's disease without complication (HCC) 01/29/2017   Diabetes mellitus type 2 with complications (HCC)    Diabetes mellitus without complication (HCC)    Dyslipidemia    Glaucoma    bilateral   History of basal cell carcinoma    History of melanoma    History of mood disorder    Hyperlipidemia    Hypertension    Lower back pain     Past Surgical History:  Procedure Laterality Date   COLONOSCOPY     KNEE ARTHROSCOPY Right 1999   MELANOMA EXCISION  1999   and squamous and basal cell excisions   TONSILLECTOMY  1965    Social History   Tobacco Use   Smoking status: Former    Current packs/day: 0.00    Types: Cigarettes    Start date: 66    Quit date: 1995    Years since quitting: 30.2    Passive exposure: Never   Smokeless tobacco: Never   Tobacco comments:    Off and on smoker   Vaping Use   Vaping status: Never Used  Substance Use Topics  Alcohol use: Yes    Comment: occasional-1-2 per week   Drug use: Never    Family History  Problem Relation Age of Onset   Breast cancer Mother    Heart attack Mother    Eczema Brother    Cancer Brother        type unknown, mets   Diabetes Brother    Rheum arthritis Paternal Aunt    Crohn's disease Paternal Agricultural consultant Allergy Daughter    Stomach cancer Neg Hx    Colon cancer Neg Hx    Esophageal cancer Neg Hx    Asthma Neg Hx    Immunodeficiency Neg Hx    Angioedema Neg Hx    Atopy Neg Hx     Allergies  Allergen Reactions   Sulfa Antibiotics Anaphylaxis    "I got these purple spots everywhere"    Ciprofloxacin    Flagyl [Metronidazole]     GI effects    Metformin And Related     Worsens his crohn disease sx,  diarrhea    Statins Other (See Comments)    Leg cramps   Mesalamine Other (See Comments)    Gastrointestinal distress    Medication list has been reviewed and updated.  Current Outpatient Medications on File Prior to Visit  Medication Sig Dispense Refill   amLODipine (NORVASC) 10 MG tablet Take 1 tablet (10 mg total) by mouth daily. 90 tablet 3   Boswellia Serrata (BOSWELLIA PO) Take 1 tablet by mouth 2 (two) times daily.     budesonide (ENTOCORT EC) 3 MG 24 hr capsule Take 3 capsules (9 mg total) by mouth daily. 90 each 11   celecoxib (CELEBREX) 200 MG capsule Take 1 capsule (200 mg total) by mouth 2 (two) times daily. 60 capsule 1   Cholecalciferol (VITAMIN D3 PO) Take by mouth. 2500 iu daily     dapagliflozin propanediol (FARXIGA) 10 MG TABS tablet Take 1 tablet (10 mg total) by mouth daily. 90 tablet 3   erythromycin ophthalmic ointment Apply 0.5 inche ribbon to both eyes at bedtime. (Patient not taking: Reported on 12/20/2023) 3.5 g 11   Evolocumab (REPATHA SURECLICK) 140 MG/ML SOAJ Inject 140 mg into the skin every 14 (fourteen) days. 2 mL 2   fluticasone (FLONASE) 50 MCG/ACT nasal spray Place 1 spray into both nostrils daily. 16 g 5   Insulin Glargine (BASAGLAR KWIKPEN) 100 UNIT/ML Inject 30 Units into the skin daily. 15 mL 3   Insulin Pen Needle (TECHLITE PEN NEEDLES) 32G X 6 MM MISC Use once daily as directed. 100 each 1   ipratropium (ATROVENT) 0.03 % nasal spray Place 2 sprays into both nostrils 3 (three) times daily as needed for nasal drainage 30 mL 12   Multiple Vitamin (MULTIVITAMIN) tablet Take 1 tablet by mouth daily.     mupirocin ointment (BACTROBAN) 2 % Apply 1 small Application topically daily with bandage changes (Patient not taking: Reported on 12/20/2023) 22 g 0   Omega-3 Fatty Acids (FISH OIL PO) Take 4 g by mouth daily.     omeprazole (PRILOSEC) 40 MG capsule Take 1 capsule (40 mg total) by mouth daily. 90 capsule 3   pioglitazone (ACTOS) 45 MG tablet Take 1 tablet  (45 mg total) by mouth daily. 90 tablet 3   prednisoLONE acetate (PRED FORTE) 1 % ophthalmic suspension Administer 1 drop into the right eye 4 (four) times a day. Do not start until after surgery 15 mL 0   Probiotic Product (VSL#3 PO)  Take by mouth as needed.     sodium chloride 0.9 % SOLN 250 mL with vedolizumab 300 MG SOLR 300 mg Inject 300 mg into the vein every 8 (eight) weeks.     timolol (TIMOPTIC) 0.5 % ophthalmic solution Place 1 drop into both eyes 2 (two) times daily. 10 mL 11   tirzepatide (MOUNJARO) 5 MG/0.5ML Pen Inject 5 mg into the skin once a week. 2 mL 2   valsartan-hydrochlorothiazide (DIOVAN-HCT) 320-25 MG tablet Take 1 tablet by mouth daily. 90 tablet 3   venlafaxine XR (EFFEXOR-XR) 37.5 MG 24 hr capsule Take 1 capsule (37.5 mg total) by mouth daily with breakfast. 90 capsule 3   No current facility-administered medications on file prior to visit.    Review of Systems:  As per HPI- otherwise negative.   Physical Examination: There were no vitals filed for this visit. There were no vitals filed for this visit. There is no height or weight on file to calculate BMI. Ideal Body Weight:    GEN: no acute distress. HEENT: Atraumatic, Normocephalic.  Ears and Nose: No external deformity. CV: RRR, No M/G/R. No JVD. No thrill. No extra heart sounds. PULM: CTA B, no wheezes, crackles, rhonchi. No retractions. No resp. distress. No accessory muscle use. ABD: S, NT, ND, +BS. No rebound. No HSM. EXTR: No c/c/e PSYCH: Normally interactive. Conversant.  Foot exam  Assessment and Plan: ***  Signed Abbe Amsterdam, MD

## 2024-03-12 NOTE — Patient Instructions (Incomplete)
 It was great to see you today, I will be in touch with your A1c

## 2024-03-13 ENCOUNTER — Other Ambulatory Visit (HOSPITAL_BASED_OUTPATIENT_CLINIC_OR_DEPARTMENT_OTHER): Payer: Self-pay

## 2024-03-13 ENCOUNTER — Ambulatory Visit (INDEPENDENT_AMBULATORY_CARE_PROVIDER_SITE_OTHER): Admitting: Family Medicine

## 2024-03-13 VITALS — BP 118/60 | HR 82 | Temp 98.0°F | Resp 18 | Ht 71.0 in | Wt 172.6 lb

## 2024-03-13 DIAGNOSIS — I10 Essential (primary) hypertension: Secondary | ICD-10-CM

## 2024-03-13 DIAGNOSIS — Z794 Long term (current) use of insulin: Secondary | ICD-10-CM

## 2024-03-13 DIAGNOSIS — K50818 Crohn's disease of both small and large intestine with other complication: Secondary | ICD-10-CM

## 2024-03-13 DIAGNOSIS — E118 Type 2 diabetes mellitus with unspecified complications: Secondary | ICD-10-CM

## 2024-03-13 DIAGNOSIS — E782 Mixed hyperlipidemia: Secondary | ICD-10-CM | POA: Diagnosis not present

## 2024-03-13 MED ORDER — TOUJEO MAX SOLOSTAR 300 UNIT/ML ~~LOC~~ SOPN
15.0000 [IU] | PEN_INJECTOR | Freq: Every day | SUBCUTANEOUS | 3 refills | Status: DC
  Filled 2024-03-13: qty 3, 60d supply, fill #0
  Filled 2024-05-31: qty 3, 60d supply, fill #1
  Filled 2024-09-18: qty 3, 60d supply, fill #2
  Filled 2024-11-24 – 2024-11-26 (×2): qty 3, 60d supply, fill #3

## 2024-03-13 MED ORDER — TIRZEPATIDE 7.5 MG/0.5ML ~~LOC~~ SOAJ
7.5000 mg | SUBCUTANEOUS | 2 refills | Status: DC
Start: 2024-03-13 — End: 2024-03-27
  Filled 2024-03-13: qty 2, 28d supply, fill #0

## 2024-03-14 ENCOUNTER — Encounter: Payer: Self-pay | Admitting: Family Medicine

## 2024-03-14 DIAGNOSIS — Z794 Long term (current) use of insulin: Secondary | ICD-10-CM

## 2024-03-14 LAB — HEMOGLOBIN A1C: Hgb A1c MFr Bld: 6.4 % (ref 4.6–6.5)

## 2024-03-20 ENCOUNTER — Ambulatory Visit: Payer: Commercial Managed Care - PPO

## 2024-03-25 ENCOUNTER — Ambulatory Visit

## 2024-03-26 ENCOUNTER — Encounter: Payer: Self-pay | Admitting: Internal Medicine

## 2024-03-26 ENCOUNTER — Ambulatory Visit: Admitting: Internal Medicine

## 2024-03-27 MED ORDER — TIRZEPATIDE 5 MG/0.5ML ~~LOC~~ SOAJ
5.0000 mg | SUBCUTANEOUS | 1 refills | Status: DC
  Filled 2024-03-27: qty 6, 84d supply, fill #0
  Filled 2024-03-28: qty 2, 28d supply, fill #0
  Filled 2024-03-28: qty 6, 84d supply, fill #0

## 2024-03-28 ENCOUNTER — Other Ambulatory Visit (HOSPITAL_BASED_OUTPATIENT_CLINIC_OR_DEPARTMENT_OTHER): Payer: Self-pay

## 2024-04-01 ENCOUNTER — Ambulatory Visit (INDEPENDENT_AMBULATORY_CARE_PROVIDER_SITE_OTHER)

## 2024-04-01 VITALS — BP 110/73 | HR 78 | Temp 97.7°F | Resp 18 | Ht 73.0 in | Wt 166.2 lb

## 2024-04-01 DIAGNOSIS — K50818 Crohn's disease of both small and large intestine with other complication: Secondary | ICD-10-CM | POA: Diagnosis not present

## 2024-04-01 MED ORDER — VEDOLIZUMAB 300 MG IV SOLR
300.0000 mg | Freq: Once | INTRAVENOUS | Status: AC
  Administered 2024-04-01: 300 mg via INTRAVENOUS
  Filled 2024-04-01: qty 5

## 2024-04-01 NOTE — Progress Notes (Signed)
 Diagnosis: Crohn's Disease  Provider:  Mannam, Praveen MD  Procedure: IV Infusion  IV Type: Peripheral, IV Location: L Antecubital  Entyvio  (Vedolizumab ), Dose: 300 mg  Infusion Start Time: 0914  Infusion Stop Time: 0947  Post Infusion IV Care: Peripheral IV Discontinued  Discharge: Condition: Good, Destination: Home . AVS Declined  Performed by:  Lendel Quant, RN

## 2024-04-28 ENCOUNTER — Encounter: Payer: Self-pay | Admitting: Internal Medicine

## 2024-04-30 ENCOUNTER — Telehealth: Payer: Self-pay

## 2024-04-30 NOTE — Telephone Encounter (Signed)
 Auth Submission: APPROVED - insurance change Site of care: Site of care: [facility] Payer: [employer] Medication & CPT/J Code(s) submitted: Entyvio  (Vedolizumab ) J3380 Route of submission (phone, fax, portal): fax Phone # Fax # [phone]  Auth type: Buy/Bill PB Units/visits requested: 300mg  x 1 dose Reference number: [id number] Approval from: 04/29/24 to 07/28/24

## 2024-05-05 ENCOUNTER — Other Ambulatory Visit: Payer: Self-pay | Admitting: Internal Medicine

## 2024-05-05 ENCOUNTER — Other Ambulatory Visit (HOSPITAL_BASED_OUTPATIENT_CLINIC_OR_DEPARTMENT_OTHER): Payer: Self-pay

## 2024-05-05 ENCOUNTER — Other Ambulatory Visit: Payer: Self-pay | Admitting: Family Medicine

## 2024-05-05 ENCOUNTER — Encounter: Payer: Self-pay | Admitting: Internal Medicine

## 2024-05-05 DIAGNOSIS — I10 Essential (primary) hypertension: Secondary | ICD-10-CM

## 2024-05-06 ENCOUNTER — Other Ambulatory Visit: Payer: Self-pay

## 2024-05-06 ENCOUNTER — Other Ambulatory Visit (HOSPITAL_BASED_OUTPATIENT_CLINIC_OR_DEPARTMENT_OTHER): Payer: Self-pay

## 2024-05-06 ENCOUNTER — Other Ambulatory Visit: Payer: Self-pay | Admitting: Family Medicine

## 2024-05-06 DIAGNOSIS — Z794 Long term (current) use of insulin: Secondary | ICD-10-CM

## 2024-05-06 MED ORDER — AMLODIPINE BESYLATE 10 MG PO TABS
10.0000 mg | ORAL_TABLET | Freq: Every day | ORAL | 1 refills | Status: DC
  Filled 2024-05-06: qty 90, 90d supply, fill #0
  Filled 2024-08-26: qty 90, 90d supply, fill #1

## 2024-05-06 MED ORDER — CELECOXIB 200 MG PO CAPS
200.0000 mg | ORAL_CAPSULE | Freq: Two times a day (BID) | ORAL | 1 refills | Status: DC
  Filled 2024-05-06: qty 60, 30d supply, fill #0
  Filled 2024-06-25: qty 60, 30d supply, fill #1

## 2024-05-08 ENCOUNTER — Telehealth: Payer: Self-pay

## 2024-05-08 ENCOUNTER — Other Ambulatory Visit (HOSPITAL_BASED_OUTPATIENT_CLINIC_OR_DEPARTMENT_OTHER): Payer: Self-pay

## 2024-05-08 DIAGNOSIS — Z794 Long term (current) use of insulin: Secondary | ICD-10-CM

## 2024-05-08 MED ORDER — DAPAGLIFLOZIN PROPANEDIOL 10 MG PO TABS
10.0000 mg | ORAL_TABLET | Freq: Every day | ORAL | 3 refills | Status: AC
  Filled 2024-05-08: qty 30, 30d supply, fill #0
  Filled 2024-05-08: qty 60, 60d supply, fill #0
  Filled 2024-08-04: qty 90, 90d supply, fill #1
  Filled 2024-11-24 – 2024-11-26 (×2): qty 90, 90d supply, fill #2

## 2024-05-08 NOTE — Telephone Encounter (Signed)
 Copied from CRM [id number]. Topic: Clinical - Prescription Issue >> May 08, 2024  2:16 PM Martinique E wrote: Reason for CRM: Patient stated he received a message from his pharmacy that his Farxiga  medication has been denied, and did not specify a reason. Patient also leaves out of town tomorrow and hoping to get this resolved before then. Callback number for patient is [phone].

## 2024-05-08 NOTE — Telephone Encounter (Signed)
 Please advise? Farxiga  is not on his med list.

## 2024-05-09 ENCOUNTER — Other Ambulatory Visit (HOSPITAL_BASED_OUTPATIENT_CLINIC_OR_DEPARTMENT_OTHER): Payer: Self-pay

## 2024-05-12 ENCOUNTER — Other Ambulatory Visit (HOSPITAL_BASED_OUTPATIENT_CLINIC_OR_DEPARTMENT_OTHER): Payer: Self-pay

## 2024-05-16 ENCOUNTER — Other Ambulatory Visit (HOSPITAL_COMMUNITY): Payer: Self-pay

## 2024-05-16 ENCOUNTER — Telehealth: Payer: Self-pay

## 2024-05-16 NOTE — Telephone Encounter (Signed)
 Prior Authorization form/request asks a question that requires your assistance. Please see the question below and advise accordingly. The PA will not be submitted until the necessary information is received.

## 2024-05-16 NOTE — Telephone Encounter (Signed)
 Diagnosis is primary hyperlipidemia  Most recent LDL dated 01/21/2024 was 66  LDL not on treatment in 2021 was 165  He is not taking statins due to intolerance.  We have tried several he has been able to tolerate due to body and muscle aches  He is not receiving ezetimibe  due to superiority of Repatha  for this patient.  He has insulin -dependent diabetes and hypertension and needs excellent LDL control

## 2024-05-16 NOTE — Telephone Encounter (Signed)
 Additional information has been requested from the patient's insurance in order to proceed with the prior authorization request. Requested information has been sent, or form has been filled out and faxed back to [phone]

## 2024-05-27 ENCOUNTER — Ambulatory Visit

## 2024-05-29 ENCOUNTER — Other Ambulatory Visit (HOSPITAL_COMMUNITY): Payer: Self-pay

## 2024-05-30 NOTE — Telephone Encounter (Signed)
 Pharmacy Patient Advocate Encounter  Received notification from [employer] that Prior Authorization for Repatha  has been APPROVED from 05/29/24 to 11/16/24

## 2024-05-31 ENCOUNTER — Other Ambulatory Visit: Payer: Self-pay | Admitting: Family Medicine

## 2024-05-31 ENCOUNTER — Other Ambulatory Visit: Payer: Self-pay

## 2024-05-31 DIAGNOSIS — F411 Generalized anxiety disorder: Secondary | ICD-10-CM

## 2024-05-31 DIAGNOSIS — I10 Essential (primary) hypertension: Secondary | ICD-10-CM

## 2024-05-31 DIAGNOSIS — K50818 Crohn's disease of both small and large intestine with other complication: Secondary | ICD-10-CM

## 2024-06-02 ENCOUNTER — Other Ambulatory Visit (HOSPITAL_BASED_OUTPATIENT_CLINIC_OR_DEPARTMENT_OTHER): Payer: Self-pay

## 2024-06-02 ENCOUNTER — Other Ambulatory Visit: Payer: Self-pay

## 2024-06-02 MED ORDER — TECHLITE PEN NEEDLES 32G X 6 MM MISC
1.0000 | Freq: Every day | 1 refills | Status: AC
  Filled 2024-06-02: qty 100, 90d supply, fill #0
  Filled 2024-11-24: qty 100, 90d supply, fill #1

## 2024-06-02 MED ORDER — VENLAFAXINE HCL ER 37.5 MG PO CP24
37.5000 mg | ORAL_CAPSULE | Freq: Every day | ORAL | 3 refills | Status: AC
  Filled 2024-06-02: qty 90, 90d supply, fill #0
  Filled 2024-10-09: qty 90, 90d supply, fill #1
  Filled 2025-01-06: qty 90, 90d supply, fill #2

## 2024-06-02 MED ORDER — VALSARTAN-HYDROCHLOROTHIAZIDE 320-25 MG PO TABS
1.0000 | ORAL_TABLET | Freq: Every day | ORAL | 3 refills | Status: AC
  Filled 2024-06-02: qty 90, 90d supply, fill #0
  Filled 2024-09-18: qty 90, 90d supply, fill #1
  Filled 2024-12-14: qty 90, 90d supply, fill #2

## 2024-06-02 MED ORDER — OMEPRAZOLE 40 MG PO CPDR
40.0000 mg | DELAYED_RELEASE_CAPSULE | Freq: Every day | ORAL | 3 refills | Status: AC
  Filled 2024-06-02: qty 90, 90d supply, fill #0
  Filled 2024-08-26: qty 90, 90d supply, fill #1
  Filled 2024-11-26: qty 90, 90d supply, fill #2

## 2024-06-06 ENCOUNTER — Encounter: Payer: Self-pay | Admitting: Internal Medicine

## 2024-06-06 ENCOUNTER — Ambulatory Visit (INDEPENDENT_AMBULATORY_CARE_PROVIDER_SITE_OTHER): Admitting: *Deleted

## 2024-06-06 VITALS — BP 108/71 | HR 87 | Temp 98.1°F | Resp 16 | Ht 72.0 in | Wt 165.2 lb

## 2024-06-06 DIAGNOSIS — H52223 Regular astigmatism, bilateral: Secondary | ICD-10-CM | POA: Diagnosis not present

## 2024-06-06 DIAGNOSIS — K50818 Crohn's disease of both small and large intestine with other complication: Secondary | ICD-10-CM | POA: Diagnosis not present

## 2024-06-06 DIAGNOSIS — E1165 Type 2 diabetes mellitus with hyperglycemia: Secondary | ICD-10-CM | POA: Diagnosis not present

## 2024-06-06 DIAGNOSIS — H5213 Myopia, bilateral: Secondary | ICD-10-CM | POA: Diagnosis not present

## 2024-06-06 DIAGNOSIS — H47333 Pseudopapilledema of optic disc, bilateral: Secondary | ICD-10-CM | POA: Diagnosis not present

## 2024-06-06 DIAGNOSIS — H401131 Primary open-angle glaucoma, bilateral, mild stage: Secondary | ICD-10-CM | POA: Diagnosis not present

## 2024-06-06 DIAGNOSIS — Z961 Presence of intraocular lens: Secondary | ICD-10-CM | POA: Diagnosis not present

## 2024-06-06 MED ORDER — VEDOLIZUMAB 300 MG IV SOLR
300.0000 mg | Freq: Once | INTRAVENOUS | Status: AC
  Administered 2024-06-06: 300 mg via INTRAVENOUS
  Filled 2024-06-06: qty 5

## 2024-06-06 NOTE — Progress Notes (Signed)
 Diagnosis: Crohn's Disease  Provider:  Mannam, Praveen MD  Procedure: IV Infusion  IV Type: Peripheral, IV Location: R Antecubital  Entyvio  (Vedolizumab ), Dose: 300 mg  Infusion Start Time: 1034 am  Infusion Stop Time: 1112 am  Post Infusion IV Care: Observation period completed and Peripheral IV Discontinued  Discharge: Condition: Good, Destination: Home . AVS Declined  Performed by:  Trudy Lamarr LABOR, RN

## 2024-06-07 NOTE — Progress Notes (Unsigned)
 [facility] [address] [place], [place] [address] [phone]  Date:  06/09/2024   Name:  Thomas Peterson   DOB:  01/01/1961   MRN:  [id number]  PCP:  Thomas Harlene BROCKS, MD    Chief Complaint: No chief complaint on file.   History of Present Illness:  Thomas Peterson is a 63 y.o. very pleasant male patient who presents with the following:  Patient seen today for diabetes follow-up.  Most recent visit with myself was in April History of diabetes, Crohn's disease, hypertension, hyperlipidemia   We previously had him on Trulicity  but he was not getting to goal and was having a lot of constipation.  As of our last visit he was on Mounjaro  5 and was tolerating okay.  He was also using 22 units of long-acting insulin  Weight did come down about 15 pounds from January of this year We decided to stick with milligrams of Mounjaro  as opposed to going up due to his success with weight loss and not needing to lose anymore.  We have also been working on tapering his insulin  He has been on Repatha  for a few years for lipids-has not tolerated statins  He is also seen by ophthalmology for glaucoma and GI for his Crohn's disease. His Crohn's disease is treated with IV infusion of Entyvio , most recent dose June 27  Amlodipine  10 Entocort  Celebrex  Farxiga  Repatha  Toujeo  Omeprazole  Actos  Mounjaro  5 Losartan HCTZ Effexor   Due for urine microalbumin Colonoscopy may be due this month He has completed Shingrix, pneumonia up-to-date Lab Results  Component Value Date   HGBA1C 6.4 03/13/2024     Patient Active Problem List   Diagnosis Date Noted   Long-term use of immunosuppressant medication- Entyvio  09/17/2018   Low back pain 06/04/2018   Controlled type 2 diabetes mellitus with complication, with long-term current use of insulin  (HCC) 01/29/2017   Essential hypertension 01/29/2017   Crohn's ileocolitis (HCC) 01/29/2017   Mixed  hyperlipidemia 01/29/2017   Leg cramps 01/29/2017   History of skin cancer 01/29/2017    Past Medical History:  Diagnosis Date   Arthritis 2020   left hand   Crohn's disease without complication (HCC) 01/29/2017   Diabetes mellitus type 2 with complications (HCC)    Diabetes mellitus without complication (HCC)    Dyslipidemia    Glaucoma    bilateral   History of basal cell carcinoma    History of melanoma    History of mood disorder    Hyperlipidemia    Hypertension    Lower back pain     Past Surgical History:  Procedure Laterality Date   COLONOSCOPY     KNEE ARTHROSCOPY Right 1999   MELANOMA EXCISION  1999   and squamous and basal cell excisions   TONSILLECTOMY  1965    Social History   Tobacco Use   Smoking status: Former    Current packs/day: 0.00    Types: Cigarettes    Start date: 70    Quit date: 1995    Years since quitting: 30.5    Passive exposure: Never   Smokeless tobacco: Never   Tobacco comments:    Off and on smoker   Vaping Use   Vaping status: Never Used  Substance Use Topics   Alcohol use: Yes    Comment: occasional-1-2 per week   Drug use: Never    Family History  Problem Relation Age of Onset  Breast cancer Mother    Heart attack Mother    Eczema Brother    Cancer Brother        type unknown, mets   Diabetes Brother    Rheum arthritis Paternal Aunt    Crohn's disease Paternal Uncle    Food Allergy  Daughter    Stomach cancer Neg Hx    Colon cancer Neg Hx    Esophageal cancer Neg Hx    Asthma Neg Hx    Immunodeficiency Neg Hx    Angioedema Neg Hx    Atopy Neg Hx     Allergies  Allergen Reactions   Sulfa Antibiotics Anaphylaxis    I got these purple spots everywhere    Ciprofloxacin    Flagyl [Metronidazole]     GI effects    Metformin  And Related     Worsens his crohn disease sx, diarrhea    Statins Other (See Comments)    Leg cramps   Mesalamine  Other (See Comments)    Gastrointestinal distress     Medication list has been reviewed and updated.  Current Outpatient Medications on File Prior to Visit  Medication Sig Dispense Refill   amLODipine  (NORVASC ) 10 MG tablet Take 1 tablet (10 mg total) by mouth daily. 90 tablet 1   Boswellia Serrata (BOSWELLIA PO) Take 1 tablet by mouth 2 (two) times daily.     budesonide  (ENTOCORT EC ) 3 MG 24 hr capsule Take 3 capsules (9 mg total) by mouth daily. 90 each 11   celecoxib  (CELEBREX ) 200 MG capsule Take 1 capsule (200 mg total) by mouth 2 (two) times daily. 60 capsule 1   Cholecalciferol (VITAMIN D3 PO) Take by mouth. 2500 iu daily     dapagliflozin  propanediol (FARXIGA ) 10 MG TABS tablet Take 1 tablet (10 mg total) by mouth daily. 90 tablet 3   erythromycin  ophthalmic ointment Apply 0.5 inche ribbon to both eyes at bedtime. 3.5 g 11   Evolocumab  (REPATHA  SURECLICK) 140 MG/ML SOAJ Inject 140 mg into the skin every 14 (fourteen) days. 2 mL 2   fluticasone  (FLONASE ) 50 MCG/ACT nasal spray Place 1 spray into both nostrils daily. 16 g 5   insulin  glargine, 2 Unit Dial , (TOUJEO  MAX SOLOSTAR) 300 UNIT/ML Solostar Pen Inject 15 Units into the skin daily. 9 mL 3   Insulin  Pen Needle (TECHLITE PEN NEEDLES) 32G X 6 MM MISC Use once daily as directed. 100 each 1   ipratropium (ATROVENT ) 0.03 % nasal spray Place 2 sprays into both nostrils 3 (three) times daily as needed for nasal drainage 30 mL 12   Multiple Vitamin (MULTIVITAMIN) tablet Take 1 tablet by mouth daily.     Omega-3 Fatty Acids (FISH OIL PO) Take 4 g by mouth daily.     omeprazole  (PRILOSEC) 40 MG capsule Take 1 capsule (40 mg total) by mouth daily. 90 capsule 3   pioglitazone  (ACTOS ) 45 MG tablet Take 1 tablet (45 mg total) by mouth daily. 90 tablet 3   prednisoLONE  acetate (PRED FORTE ) 1 % ophthalmic suspension Administer 1 drop into the right eye 4 (four) times a day. Do not start until after surgery 15 mL 0   Probiotic Product (VSL#3 PO) Take by mouth as needed.     sodium chloride  0.9 %  SOLN 250 mL with vedolizumab  300 MG SOLR 300 mg Inject 300 mg into the vein every 8 (eight) weeks.     timolol  (TIMOPTIC ) 0.5 % ophthalmic solution Place 1 drop into both eyes 2 (two) times daily.  10 mL 11   tirzepatide  (MOUNJARO ) 5 MG/0.5ML Pen Inject 5 mg into the skin once a week. 6 mL 1   valsartan -hydrochlorothiazide  (DIOVAN -HCT) 320-25 MG tablet Take 1 tablet by mouth daily. 90 tablet 3   venlafaxine  XR (EFFEXOR -XR) 37.5 MG 24 hr capsule Take 1 capsule (37.5 mg total) by mouth daily with breakfast. 90 capsule 3   No current facility-administered medications on file prior to visit.    Review of Systems:  [place]asper   Physical Examination: There were no vitals filed for this visit. There were no vitals filed for this visit. There is no height or weight on file to calculate BMI. Ideal Body Weight:    GEN: no acute distress. HEENT: Atraumatic, Normocephalic.  Ears and Nose: No external deformity. CV: RRR, No M/G/R. No JVD. No thrill. No extra heart sounds. PULM: CTA B, no wheezes, crackles, rhonchi. No retractions. No resp. distress. No accessory muscle use. ABD: S, NT, ND, +BS. No rebound. No HSM. EXTR: No c/c/e PSYCH: Normally interactive. Conversant.    Assessment and Plan: ***  Signed Harlene Schroeder, MD

## 2024-06-07 NOTE — Patient Instructions (Incomplete)
 Great to see you today as always- I will be in touch with your labs asap  I will check on [facility] direct meds for Mounjaro/ Zepbound for you- will let you know what we find out   At some point recommend getting the shingles vaccine series.  Also keep your covid UTD

## 2024-06-09 ENCOUNTER — Ambulatory Visit: Payer: Self-pay | Admitting: Family Medicine

## 2024-06-09 ENCOUNTER — Other Ambulatory Visit (HOSPITAL_BASED_OUTPATIENT_CLINIC_OR_DEPARTMENT_OTHER): Payer: Self-pay

## 2024-06-09 ENCOUNTER — Encounter: Payer: Self-pay | Admitting: Family Medicine

## 2024-06-09 ENCOUNTER — Ambulatory Visit (INDEPENDENT_AMBULATORY_CARE_PROVIDER_SITE_OTHER): Admitting: Family Medicine

## 2024-06-09 VITALS — BP 104/68 | HR 82 | Ht 72.0 in | Wt 163.4 lb

## 2024-06-09 DIAGNOSIS — Z794 Long term (current) use of insulin: Secondary | ICD-10-CM

## 2024-06-09 DIAGNOSIS — K50818 Crohn's disease of both small and large intestine with other complication: Secondary | ICD-10-CM

## 2024-06-09 DIAGNOSIS — I10 Essential (primary) hypertension: Secondary | ICD-10-CM

## 2024-06-09 DIAGNOSIS — E118 Type 2 diabetes mellitus with unspecified complications: Secondary | ICD-10-CM

## 2024-06-09 DIAGNOSIS — R634 Abnormal weight loss: Secondary | ICD-10-CM | POA: Diagnosis not present

## 2024-06-09 DIAGNOSIS — E782 Mixed hyperlipidemia: Secondary | ICD-10-CM

## 2024-06-09 LAB — LIPID PANEL
Cholesterol: 131 mg/dL (ref 0–200)
HDL: 43 mg/dL (ref >39.00)
LDL Cholesterol: 67 mg/dL (ref 0–99)
NonHDL: 87.8
Total CHOL/HDL Ratio: 3
Triglycerides: 103 mg/dL (ref 0.0–149.0)
VLDL: 20.6 mg/dL (ref 0.0–40.0)

## 2024-06-09 LAB — MICROALBUMIN / CREATININE URINE RATIO
Creatinine,U: 98.4 mg/dL
Microalb Creat Ratio: 7.7 mg/g (ref 0.0–30.0)
Microalb, Ur: 0.8 mg/dL (ref 0.0–1.9)

## 2024-06-09 LAB — CBC
HCT: 48.1 % (ref 39.0–52.0)
Hemoglobin: 16.2 g/dL (ref 13.0–17.0)
MCHC: 33.7 g/dL (ref 30.0–36.0)
MCV: 90.4 fl (ref 78.0–100.0)
Platelets: 347 10*3/uL (ref 150.0–400.0)
RBC: 5.33 Mil/uL (ref 4.22–5.81)
RDW: 13.6 % (ref 11.5–15.5)
WBC: 7.2 10*3/uL (ref 4.0–10.5)

## 2024-06-09 LAB — COMPREHENSIVE METABOLIC PANEL WITH GFR
ALT: 17 U/L (ref 0–53)
AST: 18 U/L (ref 0–37)
Albumin: 4.6 g/dL (ref 3.5–5.2)
Alkaline Phosphatase: 57 U/L (ref 39–117)
BUN: 23 mg/dL (ref 6–23)
CO2: 33 meq/L — ABNORMAL HIGH (ref 19–32)
Calcium: 9.3 mg/dL (ref 8.4–10.5)
Chloride: 99 meq/L (ref 96–112)
Creatinine, Ser: 1.24 mg/dL (ref 0.40–1.50)
GFR: 61.11 mL/min (ref >60.00)
Glucose, Bld: 83 mg/dL (ref 70–99)
Potassium: 4.1 meq/L (ref 3.5–5.1)
Sodium: 143 meq/L (ref 135–145)
Total Bilirubin: 0.8 mg/dL (ref 0.2–1.2)
Total Protein: 7.1 g/dL (ref 6.0–8.3)

## 2024-06-09 LAB — TSH: TSH: 2.35 u[IU]/mL (ref 0.35–5.50)

## 2024-06-09 MED ORDER — RYBELSUS 3 MG PO TABS
3.0000 mg | ORAL_TABLET | Freq: Every day | ORAL | 3 refills | Status: DC
  Filled 2024-06-09: qty 30, 30d supply, fill #0

## 2024-06-10 ENCOUNTER — Other Ambulatory Visit: Payer: Self-pay | Admitting: Family Medicine

## 2024-06-10 ENCOUNTER — Other Ambulatory Visit (INDEPENDENT_AMBULATORY_CARE_PROVIDER_SITE_OTHER)

## 2024-06-10 DIAGNOSIS — E118 Type 2 diabetes mellitus with unspecified complications: Secondary | ICD-10-CM | POA: Diagnosis not present

## 2024-06-10 DIAGNOSIS — Z794 Long term (current) use of insulin: Secondary | ICD-10-CM

## 2024-06-10 LAB — HEMOGLOBIN A1C: Hgb A1c MFr Bld: 6.5 % (ref 4.6–6.5)

## 2024-06-10 NOTE — Progress Notes (Signed)
 SABRA

## 2024-06-16 DIAGNOSIS — D225 Melanocytic nevi of trunk: Secondary | ICD-10-CM | POA: Diagnosis not present

## 2024-06-16 DIAGNOSIS — D2262 Melanocytic nevi of left upper limb, including shoulder: Secondary | ICD-10-CM | POA: Diagnosis not present

## 2024-06-16 DIAGNOSIS — Z85828 Personal history of other malignant neoplasm of skin: Secondary | ICD-10-CM | POA: Diagnosis not present

## 2024-06-16 DIAGNOSIS — M720 Palmar fascial fibromatosis [Dupuytren]: Secondary | ICD-10-CM | POA: Diagnosis not present

## 2024-06-16 DIAGNOSIS — L570 Actinic keratosis: Secondary | ICD-10-CM | POA: Diagnosis not present

## 2024-06-16 DIAGNOSIS — L821 Other seborrheic keratosis: Secondary | ICD-10-CM | POA: Diagnosis not present

## 2024-06-16 DIAGNOSIS — L814 Other melanin hyperpigmentation: Secondary | ICD-10-CM | POA: Diagnosis not present

## 2024-06-16 DIAGNOSIS — L84 Corns and callosities: Secondary | ICD-10-CM | POA: Diagnosis not present

## 2024-06-16 DIAGNOSIS — D1801 Hemangioma of skin and subcutaneous tissue: Secondary | ICD-10-CM | POA: Diagnosis not present

## 2024-06-16 DIAGNOSIS — D2272 Melanocytic nevi of left lower limb, including hip: Secondary | ICD-10-CM | POA: Diagnosis not present

## 2024-06-16 DIAGNOSIS — L820 Inflamed seborrheic keratosis: Secondary | ICD-10-CM | POA: Diagnosis not present

## 2024-06-16 DIAGNOSIS — Z85820 Personal history of malignant melanoma of skin: Secondary | ICD-10-CM | POA: Diagnosis not present

## 2024-06-17 ENCOUNTER — Telehealth: Payer: Self-pay | Admitting: Family Medicine

## 2024-06-17 NOTE — Telephone Encounter (Signed)
 Copied from CRM [id number]. Topic: Clinical - Medication Prior Auth >> Jun 17, 2024  5:01 PM Shereese L wrote: Reason for CRM: Sonu from [employer] Semaglutide  (RYBELSUS ) 3 MG TABS Fax over clinical information for quantity limit & Prior auth request Please  Fax# [phone] Request#s for listed above [id number], [id number]

## 2024-06-18 ENCOUNTER — Telehealth: Payer: Self-pay

## 2024-06-18 ENCOUNTER — Other Ambulatory Visit (HOSPITAL_COMMUNITY): Payer: Self-pay

## 2024-06-18 NOTE — Telephone Encounter (Signed)
Needs PA please.

## 2024-06-18 NOTE — Telephone Encounter (Signed)
 Pharmacy Patient Advocate Encounter   Received notification from Pt Calls Messages that prior authorization for Rybelsus  3mg  tabs is required/requested.   Insurance verification completed.   The patient is insured through [facility] ADVANTAGE/RX ADVANCE .   Per test claim: PA required; PA started via CoverMyMeds. KEY [id number] . Waiting for clinical questions to populate.

## 2024-06-19 ENCOUNTER — Other Ambulatory Visit (HOSPITAL_COMMUNITY): Payer: Self-pay

## 2024-06-19 ENCOUNTER — Ambulatory Visit: Admitting: Internal Medicine

## 2024-06-19 ENCOUNTER — Encounter: Payer: Self-pay | Admitting: Internal Medicine

## 2024-06-19 ENCOUNTER — Other Ambulatory Visit (HOSPITAL_BASED_OUTPATIENT_CLINIC_OR_DEPARTMENT_OTHER): Payer: Self-pay

## 2024-06-19 VITALS — BP 92/60 | HR 88 | Ht 71.0 in | Wt 166.4 lb

## 2024-06-19 DIAGNOSIS — K5080 Crohn's disease of both small and large intestine without complications: Secondary | ICD-10-CM

## 2024-06-19 DIAGNOSIS — T383X5A Adverse effect of insulin and oral hypoglycemic [antidiabetic] drugs, initial encounter: Secondary | ICD-10-CM

## 2024-06-19 DIAGNOSIS — K5903 Drug induced constipation: Secondary | ICD-10-CM

## 2024-06-19 DIAGNOSIS — Z7960 Long term (current) use of unspecified immunomodulators and immunosuppressants: Secondary | ICD-10-CM | POA: Diagnosis not present

## 2024-06-19 MED ORDER — NA SULFATE-K SULFATE-MG SULF 17.5-3.13-1.6 GM/177ML PO SOLN
1.0000 | ORAL | 0 refills | Status: DC
  Filled 2024-06-19: qty 354, 1d supply, fill #0

## 2024-06-19 NOTE — Progress Notes (Signed)
 Thomas Peterson 63 y.o. 03-Jun-1961 [id number]  Assessment & Plan:   Encounter Diagnoses  Name Primary?   Crohn's disease of both small and large intestine without complication (HCC) Yes   Long-term use of immunosuppressant medication- Entyvio     Drug-induced constipation suspected     Schedule a surveillance colonoscopy.  It sounds like he is significantly improved on Entyvio  now for about 3 years.  We will continue that.  Discussed the possibility of drug levels and we have decided not to pursue those since he is doing well and will see what the colonoscopy shows first.  Assess suspected hemorrhoids at that time as well  Constipation seems to be due to GLP-1 agonists.  He will continue his ballerina tea.  Given the constipation issues the day before colonoscopy I will have him take a dose of this laxative tea and 4 doses of MiraLAX and a modified double prep.  The risks and benefits as well as alternatives of endoscopic procedure(s) have been discussed and reviewed. All questions answered. The patient agrees to proceed.     Subjective:   Chief Complaint: Crohn's disease, hemorrhoids  HPI 63 year old man with a history of Crohn's disease of the small and large intestine, has been on Entyvio  300 mg every 8 weeks since 2022, changed from Stelara .  He was last seen in the office in 2023 (actually for colonoscopy).  He reports that overall he is doing very well on that, diarrhea problems really were resolving prior to starting GLP-1 medications for diabetes.  Sometime in the past several months he started those he was on Trulicity  and then Mounjaro  but was severely anorectic with those and was changed to Rybelsus .  He has had constipation problems and is using something called ballerina tea which has senna leaf and it and he uses that a few times a week to help with defecation.  He moves his bowels about 3-4 times a week and uses the tea 2-3 times a week.  He feels like he has some  hemorrhoidal irritation but not bleeding with some pain and itching in the anal area.  There is no discharge or significant pain or swelling.  It has been a long time since he has used as needed budesonide  which she would take intermittently over the years.  QuantiFERON testing was negative in February of this year.  He is lost significant weight with his GLP-1 agonists and his diabetes is under better control.   Lab Results  Component Value Date   WBC 7.2 06/09/2024   HGB 16.2 06/09/2024   HCT 48.1 06/09/2024   MCV 90.4 06/09/2024   PLT 347.0 06/09/2024     Chemistry      Component Value Date/Time   NA 143 06/09/2024 0847   K 4.1 06/09/2024 0847   CL 99 06/09/2024 0847   CO2 33 (H) 06/09/2024 0847   BUN 23 06/09/2024 0847   CREATININE 1.24 06/09/2024 0847   CREATININE 1.18 10/15/2020 1408      Component Value Date/Time   CALCIUM 9.3 06/09/2024 0847   ALKPHOS 57 06/09/2024 0847   AST 18 06/09/2024 0847   ALT 17 06/09/2024 0847   BILITOT 0.8 06/09/2024 0847      Lab Results  Component Value Date   TSH 2.35 06/09/2024     Wt Readings from Last 3 Encounters:  06/19/24 166 lb 6 oz (75.5 kg)  06/09/24 163 lb 6.4 oz (74.1 kg)  06/06/24 165 lb 3.2 oz (74.9 kg)  Colonoscopy 06/22/2022 -  The examined portion of the ileum was normal. Biopsied. - Three 5 to 8 mm polyps in the descending colon and in the transverse colon, removed with a cold snare. Resected and retrieved. There were other polyps seen that look like postinflammatory changes and as I saw those believe those removed were same - numerous and diminutive - not removed. - External hemorrhoids. - The examination was otherwise normal on direct and retroflexion views. I did not see any signs of active inflammation - improved over last year. - Biopsies for surveillance were taken. Cecum/ascending, transverse, descending/sigmoid, sigmoid/rectum.  1. Surgical [P], small bowel, ileum - SMALL INTESTINAL MUCOSA WITH LYMPHOID  AGGREGATES, CHARACTERISTIC FOR TERMINAL ILEUM. - NEGATIVE FOR ACTIVE ENTERITIS. - NEGATIVE FOR ARCHITECTURAL DISTORTION, PSEUDOPYLORIC METAPLASIA AND GRANULOMAS. - NEGATIVE FOR DYSPLASIA AND MALIGNANCY. 2. Surgical [P], colon, ascending and cecum - FOCAL MILD ACTIVE COLITIS, WITHOUT ASSOCIATED SIGNIFICANT ARCHITECTURAL DISTORTION OR GRANULOMAS. - NEGATIVE FOR DYSPLASIA AND MALIGNANCY. 3. Surgical [P], colon, transverse - FOCAL MILD ACTIVE COLITIS, WITHOUT ASSOCIATED SIGNIFICANT CRYPT ARCHITECTURAL DISTORTION OR GRANULOMAS. - NEGATIVE FOR DYSPLASIA AND MALIGNANCY. 4. Surgical [P], colon, transverse and descending, polyp (3) - FOCAL MILD ACTIVE COLITIS, WITHOUT ASSOCIATED SIGNIFICANT CRYPT ARCHITECTURAL DISTORTION OR GRANULOMAS. - NEGATIVE FOR DYSPLASIA AND MALIGNANCY. 5. Surgical [P], colon, descending and proximal sigmoid - COLONIC MUCOSA WITH NO SPECIFIC PATHOLOGIC DIAGNOSIS. - NEGATIVE FOR ACTIVE COLITIS, ARCHITECTURAL DISTORTION AND GRANULOMAS. - NEGATIVE FOR DYSPLASIA AND MALIGNANCY. 6. Surgical [P], colon, distal sigmoid and rectum - COLORECTAL MUCOSA WITH NO SPECIFIC PATHOLOGIC DIAGNOSIS. - NEGATIVE FOR ACTIVE COLOPROCTITIS, ARCHITECTURAL DISTORTION AND GRANULOMAS. - NEGATIVE FOR DYSPLASIA AND MALIGNANCY. Allergies  Allergen Reactions   Sulfa Antibiotics Anaphylaxis    I got these purple spots everywhere    Ciprofloxacin    Flagyl [Metronidazole]     GI effects    Metformin  And Related     Worsens his crohn disease sx, diarrhea    Statins Other (See Comments)    Leg cramps   Mesalamine  Other (See Comments)    Gastrointestinal distress   Current Meds  Medication Sig   amLODipine  (NORVASC ) 10 MG tablet Take 1 tablet (10 mg total) by mouth daily.   Boswellia Serrata (BOSWELLIA PO) Take 1 tablet by mouth 2 (two) times daily.   budesonide  (ENTOCORT EC ) 3 MG 24 hr capsule Take 3 capsules (9 mg total) by mouth daily.   celecoxib  (CELEBREX ) 200 MG capsule Take 1 capsule  (200 mg total) by mouth 2 (two) times daily.   Cholecalciferol (VITAMIN D3 PO) Take by mouth. 2500 iu daily   dapagliflozin  propanediol (FARXIGA ) 10 MG TABS tablet Take 1 tablet (10 mg total) by mouth daily.   Evolocumab  (REPATHA  SURECLICK) 140 MG/ML SOAJ Inject 140 mg into the skin every 14 (fourteen) days.   insulin  glargine, 2 Unit Dial , (TOUJEO  MAX SOLOSTAR) 300 UNIT/ML Solostar Pen Inject 15 Units into the skin daily.   Insulin  Pen Needle (TECHLITE PEN NEEDLES) 32G X 6 MM MISC Use once daily as directed.   ipratropium (ATROVENT ) 0.03 % nasal spray Place 2 sprays into both nostrils 3 (three) times daily as needed for nasal drainage   Multiple Vitamin (MULTIVITAMIN) tablet Take 1 tablet by mouth daily.   Omega-3 Fatty Acids (FISH OIL PO) Take 4 g by mouth daily.   omeprazole  (PRILOSEC) 40 MG capsule Take 1 capsule (40 mg total) by mouth daily.   Probiotic Product (VSL#3 PO) Take by mouth as needed.   Semaglutide  (RYBELSUS ) 3 MG TABS Take 1 tablet (3 mg  total) by mouth daily.   sodium chloride  0.9 % SOLN 250 mL with vedolizumab  300 MG SOLR 300 mg Inject 300 mg into the vein every 8 (eight) weeks.   valsartan -hydrochlorothiazide  (DIOVAN -HCT) 320-25 MG tablet Take 1 tablet by mouth daily.   venlafaxine  XR (EFFEXOR -XR) 37.5 MG 24 hr capsule Take 1 capsule (37.5 mg total) by mouth daily with breakfast.   Past Medical History:  Diagnosis Date   Arthritis 2020   left hand   Crohn's disease without complication (HCC) 01/29/2017   Diabetes mellitus type 2 with complications (HCC)    Diabetes mellitus without complication (HCC)    Dyslipidemia    Glaucoma    bilateral   History of basal cell carcinoma    History of melanoma    History of mood disorder    Hyperlipidemia    Hypertension    Lower back pain    Melanoma (HCC)    Past Surgical History:  Procedure Laterality Date   CATARACT EXTRACTION, BILATERAL     COLONOSCOPY     GLAUCOMA REPAIR Bilateral    KNEE ARTHROSCOPY Right 1999    MELANOMA EXCISION  1999   and squamous and basal cell excisions, and 2025   RETINAL LASER PROCEDURE Right    TONSILLECTOMY  1965   Social History   Social History Narrative   Married one daughter born 1996 she lives in [place]  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   Review of Systems As per HPI  Objective:   Physical Exam @BP  92/60 (BP Location: Left Arm, Patient Position: Sitting, Cuff Size: Normal)   Pulse 88   Ht 5' 11 (1.803 m)   Wt 166 lb 6 oz (75.5 kg)   BMI 23.20 kg/m @  General:  NAD Eyes:   anicteric Lungs:  clear Heart::  S1S2 no rubs, murmurs or gallops Abdomen:  soft and nontender, BS+ Ext:   no edema, cyanosis or clubbing    Data Reviewed:  See HPI

## 2024-06-19 NOTE — Patient Instructions (Signed)
 You have been scheduled for a colonoscopy. Please follow written instructions given to you at your visit today.   If you use inhalers (even only as needed), please bring them with you on the day of your procedure.  HOLD YOUR DIABETIC MEDICINES AS INSTRUCTED.  _______________________________________________________  If your blood pressure at your visit was 140/90 or greater, please contact your primary care physician to follow up on this.  _______________________________________________________  If you are age 77 or older, your body mass index should be between 23-30. Your Body mass index is 23.2 kg/m. If this is out of the aforementioned range listed, please consider follow up with your Primary Care Provider.  If you are age 53 or younger, your body mass index should be between 19-25. Your Body mass index is 23.2 kg/m. If this is out of the aformentioned range listed, please consider follow up with your Primary Care Provider.   ________________________________________________________  The [facility] providers would like to encourage you to use MYCHART to communicate with providers for non-urgent requests or questions.  Due to long hold times on the telephone, sending your provider a message by [facility] may be a faster and more efficient way to get a response.  Please allow 48 business hours for a response.  Please remember that this is for non-urgent requests.  _______________________________________________________  I appreciate the opportunity to care for you. Lupita Commander, MD, [facility]

## 2024-06-19 NOTE — Telephone Encounter (Signed)
 Additional information has been requested from the patient's insurance in order to proceed with the prior authorization request.      Per form, insurance only covers 60 tablets per 365 days for 3MG .   Per test claim, refill too soon. Last Filled:06/09/24 Next Fill:07/02/24.

## 2024-06-20 ENCOUNTER — Other Ambulatory Visit (HOSPITAL_BASED_OUTPATIENT_CLINIC_OR_DEPARTMENT_OTHER): Payer: Self-pay

## 2024-06-20 ENCOUNTER — Other Ambulatory Visit: Payer: Self-pay

## 2024-06-20 MED ORDER — RYBELSUS 3 MG PO TABS
3.0000 mg | ORAL_TABLET | Freq: Every day | ORAL | 1 refills | Status: DC
  Filled 2024-06-20: qty 30, 30d supply, fill #0

## 2024-06-20 NOTE — Telephone Encounter (Signed)
 Plan only covers 60 tablets of Rybelsus  3mg  in 365 day period. Please advise.

## 2024-06-25 ENCOUNTER — Other Ambulatory Visit (HOSPITAL_COMMUNITY): Payer: Self-pay

## 2024-06-25 ENCOUNTER — Other Ambulatory Visit (HOSPITAL_BASED_OUTPATIENT_CLINIC_OR_DEPARTMENT_OTHER): Payer: Self-pay

## 2024-06-25 ENCOUNTER — Encounter: Payer: Self-pay | Admitting: Family Medicine

## 2024-06-25 DIAGNOSIS — Z794 Long term (current) use of insulin: Secondary | ICD-10-CM

## 2024-06-25 MED ORDER — RYBELSUS 7 MG PO TABS
7.0000 mg | ORAL_TABLET | Freq: Every day | ORAL | 3 refills | Status: AC
  Filled 2024-06-25: qty 90, 90d supply, fill #0
  Filled 2024-10-09: qty 90, 90d supply, fill #1
  Filled 2025-01-06: qty 90, 90d supply, fill #2

## 2024-06-26 ENCOUNTER — Other Ambulatory Visit (HOSPITAL_BASED_OUTPATIENT_CLINIC_OR_DEPARTMENT_OTHER): Payer: Self-pay

## 2024-07-10 ENCOUNTER — Telehealth: Payer: Self-pay | Admitting: Pharmacy Technician

## 2024-07-10 NOTE — Telephone Encounter (Signed)
 Auth Submission: APPROVED Site of care: Site of care: [facility] Payer: [employer] Medication & CPT/J Code(s) submitted: Entyvio  (Vedolizumab ) J3380 Diagnosis Code:  Route of submission (phone, fax, portal):  Phone # Fax # Auth type: Buy/Bill PB Units/visits requested: 300mg  x2 doses Reference number: [id number] Approval from: 07/11/24 to 10/09/24

## 2024-07-17 ENCOUNTER — Encounter: Payer: Self-pay | Admitting: Internal Medicine

## 2024-07-24 NOTE — Progress Notes (Signed)
 [facility] History and Physical   Primary Care Physician:  Watt Harlene BROCKS, MD   Reason for Procedure:    Encounter Diagnosis  Name Primary?   Crohn's disease of both small and large intestine without complication (HCC) Yes     Plan:    Colonoscopy     HPI: Thomas Peterson is a 63 y.o. male with a long history of Crohn's disease of the large and small bowel.  He presents today for follow-up examination with colonoscopy, doing well on Entyvio .  Last colonoscopy 2023 with patchy colitis changes and inflammatory polyps.   Past Medical History:  Diagnosis Date   Arthritis 2020   left hand   Crohn's disease without complication (HCC) 01/29/2017   Diabetes mellitus type 2 with complications (HCC)    Diabetes mellitus without complication (HCC)    Dyslipidemia    Glaucoma    bilateral   History of basal cell carcinoma    History of melanoma    History of mood disorder    Hyperlipidemia    Hypertension    Lower back pain    Melanoma (HCC)     Past Surgical History:  Procedure Laterality Date   CATARACT EXTRACTION, BILATERAL     COLONOSCOPY     GLAUCOMA REPAIR Bilateral    KNEE ARTHROSCOPY Right 1999   MELANOMA EXCISION  1999   and squamous and basal cell excisions, and 2025   RETINAL LASER PROCEDURE Right    TONSILLECTOMY  1965     Current Outpatient Medications  Medication Sig Dispense Refill   amLODipine  (NORVASC ) 10 MG tablet Take 1 tablet (10 mg total) by mouth daily. 90 tablet 1   celecoxib  (CELEBREX ) 200 MG capsule Take 1 capsule (200 mg total) by mouth 2 (two) times daily. 60 capsule 1   Cholecalciferol (VITAMIN D3 PO) Take by mouth. 2500 iu daily     dapagliflozin  propanediol (FARXIGA ) 10 MG TABS tablet Take 1 tablet (10 mg total) by mouth daily. 90 tablet 3   insulin  glargine, 2 Unit Dial , (TOUJEO  MAX SOLOSTAR) 300 UNIT/ML Solostar Pen Inject 15 Units into the skin daily. 9 mL 3   Insulin  Pen Needle (TECHLITE PEN NEEDLES) 32G X 6 MM MISC Use  once daily as directed. 100 each 1   ipratropium (ATROVENT ) 0.03 % nasal spray Place 2 sprays into both nostrils 3 (three) times daily as needed for nasal drainage 30 mL 12   Multiple Vitamin (MULTIVITAMIN) tablet Take 1 tablet by mouth daily.     Na Sulfate-K Sulfate-Mg Sulfate concentrate (SUPREP) 17.5-3.13-1.6 GM/177ML SOLN Take 1 kit (354 mLs total) by mouth as directed. 354 mL 0   Omega-3 Fatty Acids (FISH OIL PO) Take 4 g by mouth daily.     omeprazole  (PRILOSEC) 40 MG capsule Take 1 capsule (40 mg total) by mouth daily. 90 capsule 3   Probiotic Product (VSL#3 PO) Take by mouth as needed.     valsartan -hydrochlorothiazide  (DIOVAN -HCT) 320-25 MG tablet Take 1 tablet by mouth daily. 90 tablet 3   venlafaxine  XR (EFFEXOR -XR) 37.5 MG 24 hr capsule Take 1 capsule (37.5 mg total) by mouth daily with breakfast. 90 capsule 3   Boswellia Serrata (BOSWELLIA PO) Take 1 tablet by mouth 2 (two) times daily.     budesonide  (ENTOCORT EC ) 3 MG 24 hr capsule Take 3 capsules (9 mg total) by mouth daily. 90 each 11   Evolocumab  (REPATHA  SURECLICK) 140 MG/ML SOAJ Inject 140 mg into the skin every 14 (fourteen) days. 2 mL  2   Semaglutide  (RYBELSUS ) 3 MG TABS Take 1 tablet (3 mg total) by mouth daily. 90 tablet 3   Semaglutide  (RYBELSUS ) 3 MG TABS Take 1 tablet (3 mg total) by mouth daily. (Patient not taking: Reported on 07/25/2024) 30 tablet 1   Semaglutide  (RYBELSUS ) 7 MG TABS Take 1 tablet (7 mg total) by mouth daily. 90 tablet 3   sodium chloride  0.9 % SOLN 250 mL with vedolizumab  300 MG SOLR 300 mg Inject 300 mg into the vein every 8 (eight) weeks.     Current Facility-Administered Medications  Medication Dose Route Frequency Provider Last Rate Last Admin   0.9 %  sodium chloride  infusion  500 mL Intravenous Once Avram Lupita BRAVO, MD        Allergies as of 07/25/2024 - Review Complete 07/25/2024  Allergen Reaction Noted   Sulfa antibiotics Anaphylaxis 01/29/2017   Ciprofloxacin Other (See Comments)  08/16/2017   Flagyl [metronidazole] Other (See Comments) 01/29/2017   Mesalamine  Other (See Comments) 04/14/2021   Metformin  and related Other (See Comments) 03/06/2019   Statins Other (See Comments) 01/29/2017    Family History  Problem Relation Age of Onset   Breast cancer Mother    Heart attack Mother    Eczema Brother    Cancer Brother        type unknown, mets   Diabetes Brother    Rheum arthritis Paternal Aunt    Crohn's disease Paternal Uncle    Food Allergy  Daughter    Stomach cancer Neg Hx    Colon cancer Neg Hx    Esophageal cancer Neg Hx    Asthma Neg Hx    Immunodeficiency Neg Hx    Angioedema Neg Hx    Atopy Neg Hx     Social History   Socioeconomic History   Marital status: Married    Spouse name: Not on file   Number of children: 1   Years of education: Not on file   Highest education level: Master's degree (e.g., MA, MS, MEng, MEd, MSW, MBA)  Occupational History   Occupation: Investment banker, corporate: [employer]  Tobacco Use   Smoking status: Former    Current packs/day: 0.00    Types: Cigarettes    Start date: 50    Quit date: 1995    Years since quitting: 30.6    Passive exposure: Never   Smokeless tobacco: Never   Tobacco comments:    Off and on smoker   Vaping Use   Vaping status: Never Used  Substance and Sexual Activity   Alcohol use: Yes    Comment: occasional-1-2 per week   Drug use: Never   Sexual activity: Not on file  Other Topics Concern   Not on file  Social History Narrative   Married one daughter born 1996 she lives in [place]  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   Engineer, manufacturing systems, second career, [employer], employed by [employer]   Prior smoker occasional alcohol 3 coffees a day no drugs no tobacco now   Worked for [employer] prior to going 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   Social Drivers of Corporate investment banker Strain: Low Risk   (06/08/2024)   Overall Financial Resource Strain (CARDIA)    Difficulty of Paying Living Expenses: Not hard at all  Food Insecurity: No Food Insecurity (06/08/2024)   Hunger Vital Sign    Worried About Running Out of Food in  the Last Year: Never true    Ran Out of Food in the Last Year: Never true  Transportation Needs: No Transportation Needs (06/08/2024)   PRAPARE - Administrator, Civil Service (Medical): No    Lack of Transportation (Non-Medical): No  Physical Activity: Sufficiently Active (06/08/2024)   Exercise Vital Sign    Days of Exercise per Week: 3 days    Minutes of Exercise per Session: 60 min  Stress: No Stress Concern Present (06/08/2024)   [employer] of Occupational Health - Occupational Stress Questionnaire    Feeling of Stress: Only a little  Social Connections: Moderately Isolated (06/08/2024)   Social Connection and Isolation Panel    Frequency of Communication with Friends and Family: Twice a week    Frequency of Social Gatherings with Friends and Family: Once a week    Attends Religious Services: Never    Database administrator or Organizations: No    Attends Engineer, structural: Not on file    Marital Status: Married  Catering manager Violence: Not on file    Review of Systems:  All other review of systems negative except as mentioned in the HPI.  Physical Exam: Vital signs BP 126/67   Pulse 88   Temp 98.1 F (36.7 C) (Temporal)   Ht 5' 11 (1.803 m)   Wt 166 lb (75.3 kg)   SpO2 96%   BMI 23.15 kg/m   General:   Alert,  Well-developed, well-nourished, pleasant and cooperative in NAD Lungs:  Clear throughout to auscultation.   Heart:  Regular rate and rhythm; no murmurs, clicks, rubs,  or gallops. Abdomen:  Soft, nontender and nondistended. Normal bowel sounds.   Neuro/Psych:  Alert and cooperative. Normal mood and affect. A and O x 3   @Pete Schnitzer  CHARLENA Commander, MD, [facility] [phone] (pager) 07/25/2024  1:51 PM@

## 2024-07-25 ENCOUNTER — Ambulatory Visit: Admitting: Internal Medicine

## 2024-07-25 ENCOUNTER — Encounter: Payer: Self-pay | Admitting: Internal Medicine

## 2024-07-25 VITALS — BP 100/67 | HR 78 | Temp 98.1°F | Resp 15 | Ht 71.0 in | Wt 166.0 lb

## 2024-07-25 DIAGNOSIS — K5080 Crohn's disease of both small and large intestine without complications: Secondary | ICD-10-CM

## 2024-07-25 DIAGNOSIS — E785 Hyperlipidemia, unspecified: Secondary | ICD-10-CM | POA: Diagnosis not present

## 2024-07-25 DIAGNOSIS — I10 Essential (primary) hypertension: Secondary | ICD-10-CM | POA: Diagnosis not present

## 2024-07-25 DIAGNOSIS — Z1211 Encounter for screening for malignant neoplasm of colon: Secondary | ICD-10-CM | POA: Diagnosis not present

## 2024-07-25 DIAGNOSIS — D124 Benign neoplasm of descending colon: Secondary | ICD-10-CM

## 2024-07-25 DIAGNOSIS — K644 Residual hemorrhoidal skin tags: Secondary | ICD-10-CM

## 2024-07-25 DIAGNOSIS — E119 Type 2 diabetes mellitus without complications: Secondary | ICD-10-CM | POA: Diagnosis not present

## 2024-07-25 DIAGNOSIS — K529 Noninfective gastroenteritis and colitis, unspecified: Secondary | ICD-10-CM

## 2024-07-25 DIAGNOSIS — K5190 Ulcerative colitis, unspecified, without complications: Secondary | ICD-10-CM | POA: Diagnosis not present

## 2024-07-25 MED ORDER — SODIUM CHLORIDE 0.9 % IV SOLN: 500.0000 mL | Freq: Once | INTRAVENOUS | Status: DC

## 2024-07-25 NOTE — Progress Notes (Signed)
 Called to room to assist during endoscopic procedure.  Patient ID and intended procedure confirmed with present staff. Received instructions for my participation in the procedure from the performing physician.

## 2024-07-25 NOTE — Progress Notes (Signed)
 Pt's states no medical or surgical changes since previsit or office visit.

## 2024-07-25 NOTE — Progress Notes (Signed)
 To pacu, VSS. Report to RN.tb

## 2024-07-25 NOTE — Patient Instructions (Addendum)
 Things look really good. No signs of inflammation.  One tiny polyp removed and multiple biopsies as usual.  I will let you know pathology results and when to have another routine colonoscopy by mail and/or My Chart.  I appreciate the opportunity to care for you. Lupita CHARLENA Commander, MD, [facility]  Resume previous diet Continue present medications Await pathology results  Handouts/information given for polyps and hemorrhoids  YOU HAD AN ENDOSCOPIC PROCEDURE TODAY AT THE [facility]:   Refer to the procedure report that was given to you for any specific questions about what was found during the examination.  If the procedure report does not answer your questions, please call your gastroenterologist to clarify.  If you requested that your care partner not be given the details of your procedure findings, then the procedure report has been included in a sealed envelope for you to review at your convenience later.  YOU SHOULD EXPECT: Some feelings of bloating in the abdomen. Passage of more gas than usual.  Walking can help get rid of the air that was put into your GI tract during the procedure and reduce the bloating. If you had a lower endoscopy (such as a colonoscopy or flexible sigmoidoscopy) you may notice spotting of blood in your stool or on the toilet paper. If you underwent a bowel prep for your procedure, you may not have a normal bowel movement for a few days.  Please Note:  You might notice some irritation and congestion in your nose or some drainage.  This is from the oxygen used during your procedure.  There is no need for concern and it should clear up in a day or so.  SYMPTOMS TO REPORT IMMEDIATELY:  Following lower endoscopy (colonoscopy):  Excessive amounts of blood in the stool  Significant tenderness or worsening of abdominal pains  Swelling of the abdomen that is new, acute  Fever of 100F or higher For urgent or emergent issues, a gastroenterologist can be reached at  any hour by calling [phone]. Do not use MyChart messaging for urgent concerns.   DIET:  We do recommend a small meal at first, but then you may proceed to your regular diet.  Drink plenty of fluids but you should avoid alcoholic beverages for 24 hours.  ACTIVITY:  You should plan to take it easy for the rest of today and you should NOT DRIVE or use heavy machinery until tomorrow (because of the sedation medicines used during the test).    FOLLOW UP: Our staff will call the number listed on your records the next business day following your procedure.  We will call around 7:15- 8:00 am to check on you and address any questions or concerns that you may have regarding the information given to you following your procedure. If we do not reach you, we will leave a message.     If any biopsies were taken you will be contacted by phone or by letter within the next 1-3 weeks.  Please call us  at [phone] if you have not heard about the biopsies in 3 weeks.   SIGNATURES/CONFIDENTIALITY: You and/or your care partner have signed paperwork which will be entered into your electronic medical record.  These signatures attest to the fact that that the information above on your After Visit Summary has been reviewed and is understood.  Full responsibility of the confidentiality of this discharge information lies with you and/or your care-partner.

## 2024-07-25 NOTE — Op Note (Signed)
 [facility] Patient Name: Thomas Peterson Procedure Date: 07/25/2024 1:54 PM MRN: [id number] Endoscopist: Lupita FORBES Commander , MD, [id number] Age: 63 Referring MD:  Date of Birth: 02-Mar-1961 Gender: Male Account #: [id number] Procedure:                Colonoscopy Indications:              High risk colon cancer surveillance: Crohn's small                            and large intestine, Last colonoscopy: 2023 Doing                            well on Entyvio  (duration of treatment 3 years) Medicines:                Monitored Anesthesia Care Procedure:                Pre-Anesthesia Assessment:                           - Prior to the procedure, a History and Physical                            was performed, and patient medications and                            allergies were reviewed. The patient's tolerance of                            previous anesthesia was also reviewed. The risks                            and benefits of the procedure and the sedation                            options and risks were discussed with the patient.                            All questions were answered, and informed consent                            was obtained. Prior Anticoagulants: The patient has                            taken no anticoagulant or antiplatelet agents. ASA                            Grade Assessment: II - A patient with mild systemic                            disease. After reviewing the risks and benefits,                            the patient was deemed in satisfactory condition to  undergo the procedure.                           After obtaining informed consent, the colonoscope                            was passed under direct vision. Throughout the                            procedure, the patient's blood pressure, pulse, and                            oxygen saturations were monitored continuously. The                            CF [id number]  #[id number] was introduced through the anus                            and advanced to the the terminal ileum, with                            identification of the appendiceal orifice and IC                            valve. The colonoscopy was performed without                            difficulty. The patient tolerated the procedure                            well. The quality of the bowel preparation was                            good. The terminal ileum, ileocecal valve,                            appendiceal orifice, and rectum were photographed.                            The bowel preparation used was SUPREP via extended                            prep with split dose instruction. Scope In: 2:01:51 PM Scope Out: 2:23:30 PM Scope Withdrawal Time: 0 hours 17 minutes 46 seconds  Total Procedure Duration: 0 hours 21 minutes 39 seconds  Findings:                 The perianal and digital rectal examinations were                            normal. Pertinent negatives include normal prostate                            (size, shape, and consistency).  The terminal ileum appeared normal. Biopsies were                            taken with a cold forceps for histology.                            Verification of patient identification for the                            specimen was done. Estimated blood loss was minimal.                           A 4 mm polyp was found in the proximal descending                            colon. The polyp was sessile. The polyp was removed                            with a cold snare. Resection and retrieval were                            complete. Verification of patient identification                            for the specimen was done. Estimated blood loss was                            minimal.                           External hemorrhoids were found.                           The exam was otherwise without abnormality on                             direct and retroflexion views.                           Two biopsies were taken every 10 cm with a cold                            forceps from the entire colon for Crohn's disease                            surveillance. These biopsy specimens from the                            Cecum/ascending. transverse, descending/proximal                            sigmoid and distal sigmoid/rectum were sent to  Pathology. Estimated blood loss was minimal.                            Verification of patient identification for the                            specimen was done. Complications:            No immediate complications. Estimated Blood Loss:     Estimated blood loss was minimal. Impression:               - The examined portion of the ileum was normal.                            Biopsied.                           - One 4 mm polyp in the proximal descending colon,                            removed with a cold snare. Resected and retrieved.                           - External hemorrhoids.                           - The examination was otherwise normal on direct                            and retroflexion views.                           - Biopsies for surveillance were taken from the                            entire colon. Recommendation:           - Patient has a contact number available for                            emergencies. The signs and symptoms of potential                            delayed complications were discussed with the                            patient. Return to normal activities tomorrow.                            Written discharge instructions were provided to the                            patient.                           - Resume previous diet.                           -  Continue present medications. (Entyvio )                           - Await pathology results.                           - Repeat colonoscopy is  recommended for                            surveillance. The colonoscopy date will be                            determined after pathology results from today's                            exam become available for review. Lupita FORBES Commander, MD 07/25/2024 2:36:22 PM This report has been signed electronically.

## 2024-07-28 ENCOUNTER — Telehealth: Payer: Self-pay | Admitting: *Deleted

## 2024-07-28 NOTE — Telephone Encounter (Signed)
 No answer after post call. Left a message.

## 2024-07-30 LAB — SURGICAL PATHOLOGY

## 2024-07-31 ENCOUNTER — Ambulatory Visit: Payer: Self-pay | Admitting: Internal Medicine

## 2024-08-01 ENCOUNTER — Ambulatory Visit (INDEPENDENT_AMBULATORY_CARE_PROVIDER_SITE_OTHER)

## 2024-08-01 VITALS — BP 112/73 | HR 80 | Temp 98.6°F | Resp 18 | Ht 72.0 in | Wt 169.0 lb

## 2024-08-01 DIAGNOSIS — K50818 Crohn's disease of both small and large intestine with other complication: Secondary | ICD-10-CM

## 2024-08-01 MED ORDER — VEDOLIZUMAB 300 MG IV SOLR
300.0000 mg | Freq: Once | INTRAVENOUS | Status: AC
  Administered 2024-08-01: 300 mg via INTRAVENOUS
  Filled 2024-08-01: qty 5

## 2024-08-01 NOTE — Progress Notes (Signed)
 Diagnosis: Crohn's Disease  Provider:  Praveen Mannam MD  Procedure: IV Infusion  IV Type: Peripheral, IV Location: L Antecubital  Entyvio  (Vedolizumab ), Dose: 300 mg  Infusion Start Time: 1021  Infusion Stop Time: 1055  Post Infusion IV Care: Peripheral IV Discontinued  Discharge: Condition: Good, Destination: Home . AVS Declined  Performed by:  Maximiano JONELLE Pouch, LPN

## 2024-08-05 ENCOUNTER — Other Ambulatory Visit (HOSPITAL_BASED_OUTPATIENT_CLINIC_OR_DEPARTMENT_OTHER): Payer: Self-pay

## 2024-08-26 ENCOUNTER — Other Ambulatory Visit: Payer: Self-pay | Admitting: Family Medicine

## 2024-08-26 ENCOUNTER — Other Ambulatory Visit: Payer: Self-pay | Admitting: Internal Medicine

## 2024-08-26 ENCOUNTER — Other Ambulatory Visit: Payer: Self-pay

## 2024-08-26 ENCOUNTER — Other Ambulatory Visit (HOSPITAL_BASED_OUTPATIENT_CLINIC_OR_DEPARTMENT_OTHER): Payer: Self-pay

## 2024-08-26 MED ORDER — REPATHA SURECLICK 140 MG/ML ~~LOC~~ SOAJ
140.0000 mg | SUBCUTANEOUS | 11 refills | Status: AC
  Filled 2024-08-26: qty 2, 28d supply, fill #0
  Filled 2024-10-09: qty 2, 28d supply, fill #1
  Filled 2024-11-24: qty 2, 28d supply, fill #2

## 2024-08-26 MED ORDER — CELECOXIB 200 MG PO CAPS
200.0000 mg | ORAL_CAPSULE | Freq: Two times a day (BID) | ORAL | 1 refills | Status: DC
  Filled 2024-08-26: qty 60, 30d supply, fill #0
  Filled 2024-10-09: qty 60, 30d supply, fill #1

## 2024-09-02 ENCOUNTER — Other Ambulatory Visit (HOSPITAL_BASED_OUTPATIENT_CLINIC_OR_DEPARTMENT_OTHER): Payer: Self-pay

## 2024-09-02 MED ORDER — FLUZONE HIGH-DOSE 0.5 ML IM SUSY
0.5000 mL | PREFILLED_SYRINGE | Freq: Once | INTRAMUSCULAR | 0 refills | Status: AC
  Filled 2024-09-02: qty 0.5, 1d supply, fill #0

## 2024-09-09 ENCOUNTER — Other Ambulatory Visit (HOSPITAL_BASED_OUTPATIENT_CLINIC_OR_DEPARTMENT_OTHER): Payer: Self-pay

## 2024-09-09 MED ORDER — COMIRNATY 30 MCG/0.3ML IM SUSY
0.3000 mL | PREFILLED_SYRINGE | Freq: Once | INTRAMUSCULAR | 0 refills | Status: AC
Start: 2024-09-09 — End: 2024-09-10
  Filled 2024-09-09: qty 0.3, 1d supply, fill #0

## 2024-09-11 ENCOUNTER — Telehealth: Payer: Self-pay | Admitting: Pharmacy Technician

## 2024-09-11 NOTE — Telephone Encounter (Addendum)
 Auth Submission: approved LATENT Site of care: Site of care: [facility] Payer: [employer] ADVT Medication & CPT/J Code(s) submitted: Entyvio  (Vedolizumab ) J3380 Diagnosis Code:  Route of submission (phone, fax, portal): LATENT Phone # Fax # Auth type: Buy/Bill PB Units/visits requested: 300MG  Q8WKS Reference number:

## 2024-09-16 ENCOUNTER — Encounter: Payer: Self-pay | Admitting: Internal Medicine

## 2024-09-16 ENCOUNTER — Telehealth: Payer: Self-pay | Admitting: Pharmacy Technician

## 2024-09-16 NOTE — Telephone Encounter (Signed)
 Auth Submission: APPROVED Site of care: Site of care: [facility] Payer: [employer] ADVT Medication & CPT/J Code(s) submitted: Entyvio  (Vedolizumab ) J3380 Diagnosis Code:  Route of submission (phone, fax, portal):  Phone # Fax # Auth type: Buy/Bill PB Units/visits requested: 300MG  Q8WKS X 2 DOSES Reference number: [id number] Approval from: 10/10/24 to 01/08/25

## 2024-09-18 ENCOUNTER — Other Ambulatory Visit (HOSPITAL_BASED_OUTPATIENT_CLINIC_OR_DEPARTMENT_OTHER): Payer: Self-pay

## 2024-09-25 ENCOUNTER — Other Ambulatory Visit (HOSPITAL_COMMUNITY): Payer: Self-pay

## 2024-09-26 ENCOUNTER — Ambulatory Visit (INDEPENDENT_AMBULATORY_CARE_PROVIDER_SITE_OTHER)

## 2024-09-26 VITALS — BP 111/72 | HR 80 | Temp 97.4°F | Resp 14 | Ht 72.0 in | Wt 167.4 lb

## 2024-09-26 DIAGNOSIS — K50818 Crohn's disease of both small and large intestine with other complication: Secondary | ICD-10-CM | POA: Diagnosis not present

## 2024-09-26 MED ORDER — VEDOLIZUMAB 300 MG IV SOLR
300.0000 mg | Freq: Once | INTRAVENOUS | Status: AC
  Administered 2024-09-26: 300 mg via INTRAVENOUS
  Filled 2024-09-26: qty 5

## 2024-09-26 NOTE — Progress Notes (Signed)
 Diagnosis: Crohn's Disease  Provider:  Praveen Mannam MD  Procedure: IV Infusion  IV Type: Peripheral, IV Location: L Antecubital  Entyvio  (Vedolizumab ), Dose: 300 mg  Infusion Start Time: 1013  Infusion Stop Time: 1045  Post Infusion IV Care: Peripheral IV Discontinued  Discharge: Condition: Good, Destination: Home . AVS Declined  Performed by:  Eleanor DELENA Bloch, RN

## 2024-10-09 ENCOUNTER — Other Ambulatory Visit (HOSPITAL_BASED_OUTPATIENT_CLINIC_OR_DEPARTMENT_OTHER): Payer: Self-pay

## 2024-11-21 ENCOUNTER — Ambulatory Visit

## 2024-11-21 ENCOUNTER — Ambulatory Visit: Payer: Self-pay

## 2024-11-21 ENCOUNTER — Encounter: Payer: Self-pay | Admitting: Family Medicine

## 2024-11-21 ENCOUNTER — Other Ambulatory Visit (HOSPITAL_BASED_OUTPATIENT_CLINIC_OR_DEPARTMENT_OTHER): Payer: Self-pay

## 2024-11-21 MED ORDER — OSELTAMIVIR PHOSPHATE 75 MG PO CAPS
75.0000 mg | ORAL_CAPSULE | Freq: Two times a day (BID) | ORAL | 0 refills | Status: AC
  Filled 2024-11-21: qty 14, 7d supply, fill #0

## 2024-11-21 NOTE — Telephone Encounter (Signed)
 FYI Only or Action Required?: FYI only for provider: appointment scheduled on virtual UC 11/22/24.  Patient was last seen in primary care on 06/09/2024 by [name], Harlene BROCKS, MD.  Called Nurse Triage reporting Influenza.  Symptoms began several days ago.  Interventions attempted: Nothing.  Symptoms are: gradually worsening.  Triage Disposition: Call PCP Within 24 Hours  Patient/caregiver understands and will follow disposition?: Yes  Copied from CRM #[id number]. Topic: Clinical - Red Word Triage >> Nov 21, 2024  3:21 PM Franky GRADE wrote: Red Word that prompted transfer to Nurse Triage: Patient is experiencing sore throat and tickle cough, posterior sinus drainage and all around pain and tired. He has tested positive for Flu type A. Reason for Disposition  Patient is HIGH RISK (e.g., age > 64 years, pregnant, HIV+, or chronic medical condition)  Answer Assessment - Initial Assessment Questions Patient tested positive for Flu A today   1. WORST SYMPTOM: What is your worst symptom? (e.g., cough, runny nose, muscle aches, headache, sore throat, fever)      Swallowing, throat pain 2. ONSET: When did your flu symptoms start?      Three days ago 3. COUGH: How bad is the cough?       Positive for mild cough 4. RESPIRATORY DISTRESS: Describe your breathing.      denies 5. FEVER: Do you have a fever? If Yes, ask: What is your temperature, how was it measured, and when did it start?     Denies fever 6. EXPOSURE: Were you exposed to someone with influenza?       Recently on vacation  8. HIGH RISK DISEASE: Do you have any chronic medical problems? (e.g., heart or lung disease, asthma, weak immune system, or other HIGH RISK conditions)     Chrons and diabetes, HTN  10. OTHER SYMPTOMS: Do you have any other symptoms?  (e.g., runny nose, muscle aches, headache, sore throat)       Body ache, fatigue, sinus drainage  Protocols used: Influenza (Flu) - [facility]

## 2024-11-21 NOTE — Telephone Encounter (Signed)
 Telehealth appt scheduled.

## 2024-11-22 ENCOUNTER — Telehealth

## 2024-11-22 DIAGNOSIS — J028 Acute pharyngitis due to other specified organisms: Secondary | ICD-10-CM

## 2024-11-22 MED ORDER — LIDOCAINE VISCOUS HCL 2 % MT SOLN: 15.0000 mL | OROMUCOSAL | 0 refills | Status: AC | PRN

## 2024-11-22 NOTE — Progress Notes (Signed)
 Virtual Visit Consent   Thomas Peterson, you are scheduled for a virtual visit with a [place] provider today. Just as with appointments in the office, your consent must be obtained to participate. Your consent will be active for this visit and any virtual visit you may have with one of our providers in the next 365 days. If you have a MyChart account, a copy of this consent can be sent to you electronically.  As this is a virtual visit, video technology does not allow for your provider to perform a traditional examination. This may limit your provider's ability to fully assess your condition. If your provider identifies any concerns that need to be evaluated in person or the need to arrange testing (such as labs, EKG, etc.), we will make arrangements to do so. Although advances in technology are sophisticated, we cannot ensure that it will always work on either your end or our end. If the connection with a video visit is poor, the visit may have to be switched to a telephone visit. With either a video or telephone visit, we are not always able to ensure that we have a secure connection.  By engaging in this virtual visit, you consent to the provision of healthcare and authorize for your insurance to be billed (if applicable) for the services provided during this visit. Depending on your insurance coverage, you may receive a charge related to this service.  I need to obtain your verbal consent now. Are you willing to proceed with your visit today? Thomas Peterson has provided verbal consent on 11/22/2024 for a virtual visit (video or telephone). Thomas LELON Servant, Thomas Peterson  Date: 11/22/2024 9:23 AM   Virtual Visit via Video Note   I, Thomas Peterson, connected with  Thomas Peterson  ([id number], [birth year]) on 11/22/2024 at  9:00 AM EST by a video-enabled telemedicine application and verified that I am speaking with the correct person using two identifiers.  Location: Patient: Virtual Visit Location Patient:  Home Provider: Virtual Visit Location Provider: Home Office   I discussed the limitations of evaluation and management by telemedicine and the availability of in person appointments. The patient expressed understanding and agreed to proceed.    History of Present Illness: Thomas Peterson is a 63 y.o. who identifies as a male who was assigned male at birth, and is being seen today for worsening symptoms of influenza A.  Thomas Peterson tested postivie for influenza and was started on Tamiflu  yesterday by his primary care.  He is being seen today for worsening pharyngitis and increased postnasal drainage.  Associated symptoms include chills.  He does have Flonase  however has been using this sporadically.  He is not experiencing any other symptoms such as shortness of breath, wheezing or nausea vomiting.   Problems:  Patient Active Problem List   Diagnosis Date Noted   Long-term use of immunosuppressant medication- Entyvio  09/17/2018   Low back pain 06/04/2018   Controlled type 2 diabetes mellitus with complication, with long-term current use of insulin  (HCC) 01/29/2017   Essential hypertension 01/29/2017   Crohn's ileocolitis (HCC) 01/29/2017   Mixed hyperlipidemia 01/29/2017   Leg cramps 01/29/2017   History of skin cancer 01/29/2017    Allergies: Allergies[1] Medications: Current Medications[2]  Observations/Objective: Patient is well-developed, well-nourished in no acute distress.  Resting comfortably at home.  Head is normocephalic, atraumatic.  No labored breathing.  Speech is clear and coherent with logical content.  Patient is alert and oriented at baseline.    Assessment and  Plan: 1. Pharyngitis due to other organism (Primary) - lidocaine  (XYLOCAINE ) 2 % solution; Use as directed 15 mLs in the mouth or throat as needed for mouth pain.  Dispense: 200 mL; Refill: 0 Recommended Tylenol for pain.  Would not recommend NSAIDs due to history of Crohn's.  Follow Up Instructions: I  discussed the assessment and treatment plan with the patient. The patient was provided an opportunity to ask questions and all were answered. The patient agreed with the plan and demonstrated an understanding of the instructions.  A copy of instructions were sent to the patient via MyChart unless otherwise noted below.     The patient was advised to call back or seek an in-person evaluation if the symptoms worsen or if the condition fails to improve as anticipated.    Thomas LELON Servant, Thomas Peterson      [1]  Allergies Allergen Reactions   Sulfa Antibiotics Anaphylaxis    I got these purple spots everywhere    Ciprofloxacin Other (See Comments)    GI upset   Flagyl [Metronidazole] Other (See Comments)    GI effects    Mesalamine  Other (See Comments)    Gastrointestinal distress   Metformin  And Related Other (See Comments)    Worsens his crohn disease sx, diarrhea    Statins Other (See Comments)    Leg cramps  [2]  Current Outpatient Medications:    lidocaine  (XYLOCAINE ) 2 % solution, Use as directed 15 mLs in the mouth or throat as needed for mouth pain., Disp: 200 mL, Rfl: 0   amLODipine  (NORVASC ) 10 MG tablet, Take 1 tablet (10 mg total) by mouth daily., Disp: 90 tablet, Rfl: 1   Boswellia Serrata (BOSWELLIA PO), Take 1 tablet by mouth 2 (two) times daily., Disp: , Rfl:    budesonide  (ENTOCORT EC ) 3 MG 24 hr capsule, Take 3 capsules (9 mg total) by mouth daily., Disp: 90 each, Rfl: 11   celecoxib  (CELEBREX ) 200 MG capsule, Take 1 capsule (200 mg total) by mouth 2 (two) times daily., Disp: 60 capsule, Rfl: 1   Cholecalciferol (VITAMIN D3 PO), Take by mouth. 2500 iu daily, Disp: , Rfl:    dapagliflozin  propanediol (FARXIGA ) 10 MG TABS tablet, Take 1 tablet (10 mg total) by mouth daily., Disp: 90 tablet, Rfl: 3   Evolocumab  (REPATHA  SURECLICK) 140 MG/ML SOAJ, Inject 140 mg into the skin every 14 (fourteen) days., Disp: 2 mL, Rfl: 11   insulin  glargine, 2 Unit Dial , (TOUJEO  MAX SOLOSTAR) 300  UNIT/ML Solostar Pen, Inject 15 Units into the skin daily., Disp: 9 mL, Rfl: 3   Insulin  Pen Needle (TECHLITE PEN NEEDLES) 32G X 6 MM MISC, Use once daily as directed., Disp: 100 each, Rfl: 1   ipratropium (ATROVENT ) 0.03 % nasal spray, Place 2 sprays into both nostrils 3 (three) times daily as needed for nasal drainage, Disp: 30 mL, Rfl: 12   Multiple Vitamin (MULTIVITAMIN) tablet, Take 1 tablet by mouth daily., Disp: , Rfl:    Omega-3 Fatty Acids (FISH OIL PO), Take 4 g by mouth daily., Disp: , Rfl:    omeprazole  (PRILOSEC) 40 MG capsule, Take 1 capsule (40 mg total) by mouth daily., Disp: 90 capsule, Rfl: 3   oseltamivir  (TAMIFLU ) 75 MG capsule, Take 1 capsule (75 mg total) by mouth 2 (two) times daily., Disp: 14 capsule, Rfl: 0   Probiotic Product (VSL#3 PO), Take by mouth as needed., Disp: , Rfl:    Semaglutide  (RYBELSUS ) 3 MG TABS, Take 1 tablet (3 mg total)  by mouth daily., Disp: 90 tablet, Rfl: 3   Semaglutide  (RYBELSUS ) 3 MG TABS, Take 1 tablet (3 mg total) by mouth daily. (Patient not taking: Reported on 07/25/2024), Disp: 30 tablet, Rfl: 1   Semaglutide  (RYBELSUS ) 7 MG TABS, Take 1 tablet (7 mg total) by mouth daily., Disp: 90 tablet, Rfl: 3   sodium chloride  0.9 % SOLN 250 mL with vedolizumab  300 MG SOLR 300 mg, Inject 300 mg into the vein every 8 (eight) weeks., Disp: , Rfl:    valsartan -hydrochlorothiazide  (DIOVAN -HCT) 320-25 MG tablet, Take 1 tablet by mouth daily., Disp: 90 tablet, Rfl: 3   venlafaxine  XR (EFFEXOR -XR) 37.5 MG 24 hr capsule, Take 1 capsule (37.5 mg total) by mouth daily with breakfast., Disp: 90 capsule, Rfl: 3

## 2024-11-22 NOTE — Patient Instructions (Signed)
 Thomas Peterson, thank you for joining Haze LELON Servant, NP for today's virtual visit.  While this provider is not your primary care provider (PCP), if your PCP is located in our provider database this encounter information will be shared with them immediately following your visit.   A [facility] MyChart account gives you access to today's visit and all your visits, tests, and labs performed at [facility]  click here if you don't have a [facility] MyChart account or go to [email or web address]  Consent: (Patient) Thomas Peterson provided verbal consent for this virtual visit at the beginning of the encounter.  Current Medications:  Current Outpatient Medications:    lidocaine  (XYLOCAINE ) 2 % solution, Use as directed 15 mLs in the mouth or throat as needed for mouth pain., Disp: 200 mL, Rfl: 0   amLODipine  (NORVASC ) 10 MG tablet, Take 1 tablet (10 mg total) by mouth daily., Disp: 90 tablet, Rfl: 1   Boswellia Serrata (BOSWELLIA PO), Take 1 tablet by mouth 2 (two) times daily., Disp: , Rfl:    budesonide  (ENTOCORT EC ) 3 MG 24 hr capsule, Take 3 capsules (9 mg total) by mouth daily., Disp: 90 each, Rfl: 11   celecoxib  (CELEBREX ) 200 MG capsule, Take 1 capsule (200 mg total) by mouth 2 (two) times daily., Disp: 60 capsule, Rfl: 1   Cholecalciferol (VITAMIN D3 PO), Take by mouth. 2500 iu daily, Disp: , Rfl:    dapagliflozin  propanediol (FARXIGA ) 10 MG TABS tablet, Take 1 tablet (10 mg total) by mouth daily., Disp: 90 tablet, Rfl: 3   Evolocumab  (REPATHA  SURECLICK) 140 MG/ML SOAJ, Inject 140 mg into the skin every 14 (fourteen) days., Disp: 2 mL, Rfl: 11   insulin  glargine, 2 Unit Dial , (TOUJEO  MAX SOLOSTAR) 300 UNIT/ML Solostar Pen, Inject 15 Units into the skin daily., Disp: 9 mL, Rfl: 3   Insulin  Pen Needle (TECHLITE PEN NEEDLES) 32G X 6 MM MISC, Use once daily as directed., Disp: 100 each, Rfl: 1   ipratropium (ATROVENT ) 0.03 % nasal spray, Place 2 sprays into both nostrils 3 (three)  times daily as needed for nasal drainage, Disp: 30 mL, Rfl: 12   Multiple Vitamin (MULTIVITAMIN) tablet, Take 1 tablet by mouth daily., Disp: , Rfl:    Omega-3 Fatty Acids (FISH OIL PO), Take 4 g by mouth daily., Disp: , Rfl:    omeprazole  (PRILOSEC) 40 MG capsule, Take 1 capsule (40 mg total) by mouth daily., Disp: 90 capsule, Rfl: 3   oseltamivir  (TAMIFLU ) 75 MG capsule, Take 1 capsule (75 mg total) by mouth 2 (two) times daily., Disp: 14 capsule, Rfl: 0   Probiotic Product (VSL#3 PO), Take by mouth as needed., Disp: , Rfl:    Semaglutide  (RYBELSUS ) 3 MG TABS, Take 1 tablet (3 mg total) by mouth daily., Disp: 90 tablet, Rfl: 3   Semaglutide  (RYBELSUS ) 3 MG TABS, Take 1 tablet (3 mg total) by mouth daily. (Patient not taking: Reported on 07/25/2024), Disp: 30 tablet, Rfl: 1   Semaglutide  (RYBELSUS ) 7 MG TABS, Take 1 tablet (7 mg total) by mouth daily., Disp: 90 tablet, Rfl: 3   sodium chloride  0.9 % SOLN 250 mL with vedolizumab  300 MG SOLR 300 mg, Inject 300 mg into the vein every 8 (eight) weeks., Disp: , Rfl:    valsartan -hydrochlorothiazide  (DIOVAN -HCT) 320-25 MG tablet, Take 1 tablet by mouth daily., Disp: 90 tablet, Rfl: 3   venlafaxine  XR (EFFEXOR -XR) 37.5 MG 24 hr capsule, Take 1 capsule (37.5 mg total) by mouth daily with breakfast., Disp:  90 capsule, Rfl: 3   Medications ordered in this encounter:  Meds ordered this encounter  Medications   lidocaine  (XYLOCAINE ) 2 % solution    Sig: Use as directed 15 mLs in the mouth or throat as needed for mouth pain.    Dispense:  200 mL    Refill:  0    Supervising Provider:   BLAISE ALEENE KIDD [[id number]]     *If you need refills on other medications prior to your next appointment, please contact your pharmacy*  Follow-Up: Call back or seek an in-person evaluation if the symptoms worsen or if the condition fails to improve as anticipated.  [facility] [phone]  Other Instructions Recommended Tylenol for pain.  Would not  recommend NSAIDs due to history of Crohn's.   If you have been instructed to have an in-person evaluation today at a local Urgent Care facility, please use the link below. It will take you to a list of all of our available [facility], including address, phone number and hours of operation. Please do not delay care.  [facility]  If you or a family member do not have a primary care provider, use the link below to schedule a visit and establish care. When you choose a [facility] primary care physician or advanced practice provider, you gain a long-term partner in health. Find a Primary Care Provider  Learn more about [facility]'s in-office and virtual care options: [facility] - Get Care Now

## 2024-11-24 ENCOUNTER — Other Ambulatory Visit: Payer: Self-pay | Admitting: Family Medicine

## 2024-11-24 ENCOUNTER — Other Ambulatory Visit: Payer: Self-pay

## 2024-11-24 ENCOUNTER — Other Ambulatory Visit: Payer: Self-pay | Admitting: Internal Medicine

## 2024-11-24 ENCOUNTER — Other Ambulatory Visit (HOSPITAL_BASED_OUTPATIENT_CLINIC_OR_DEPARTMENT_OTHER): Payer: Self-pay

## 2024-11-24 DIAGNOSIS — I10 Essential (primary) hypertension: Secondary | ICD-10-CM

## 2024-11-24 DIAGNOSIS — R0982 Postnasal drip: Secondary | ICD-10-CM

## 2024-11-24 MED ORDER — IPRATROPIUM BROMIDE 0.03 % NA SOLN
2.0000 | Freq: Three times a day (TID) | NASAL | 1 refills | Status: AC
  Filled 2024-11-24 – 2024-11-26 (×2): qty 30, 50d supply, fill #0

## 2024-11-24 MED ORDER — AMLODIPINE BESYLATE 10 MG PO TABS
10.0000 mg | ORAL_TABLET | Freq: Every day | ORAL | 0 refills | Status: AC
  Filled 2024-11-24 – 2024-11-26 (×2): qty 90, 90d supply, fill #0

## 2024-11-24 MED ORDER — CELECOXIB 200 MG PO CAPS
200.0000 mg | ORAL_CAPSULE | Freq: Two times a day (BID) | ORAL | 2 refills | Status: DC
  Filled 2024-11-24 – 2024-11-26 (×2): qty 60, 30d supply, fill #0
  Filled 2024-12-25: qty 60, 30d supply, fill #1

## 2024-11-25 ENCOUNTER — Encounter: Payer: Self-pay | Admitting: Internal Medicine

## 2024-11-25 ENCOUNTER — Other Ambulatory Visit (HOSPITAL_BASED_OUTPATIENT_CLINIC_OR_DEPARTMENT_OTHER): Payer: Self-pay

## 2024-11-26 ENCOUNTER — Ambulatory Visit

## 2024-11-26 ENCOUNTER — Other Ambulatory Visit: Payer: Self-pay

## 2024-11-26 ENCOUNTER — Other Ambulatory Visit (HOSPITAL_BASED_OUTPATIENT_CLINIC_OR_DEPARTMENT_OTHER): Payer: Self-pay

## 2024-11-26 VITALS — BP 107/71 | HR 94 | Temp 98.2°F | Resp 20 | Ht 72.0 in | Wt 164.4 lb

## 2024-11-26 DIAGNOSIS — K50818 Crohn's disease of both small and large intestine with other complication: Secondary | ICD-10-CM | POA: Diagnosis not present

## 2024-11-26 MED ORDER — VEDOLIZUMAB 300 MG IV SOLR
300.0000 mg | Freq: Once | INTRAVENOUS | Status: AC
  Administered 2024-11-26: 10:00:00 300 mg via INTRAVENOUS
  Filled 2024-11-26: qty 5

## 2024-11-26 NOTE — Progress Notes (Signed)
 Diagnosis: Crohn's Disease  Provider:  Mannam, Praveen MD  Procedure: IV Infusion  IV Type: Peripheral, IV Location: R Antecubital  Entyvio  (Vedolizumab ), Dose: 300 mg  Infusion Start Time: 1024  Infusion Stop Time: 1058  Post Infusion IV Care: Peripheral IV Discontinued  Discharge: Condition: Good, Destination: Home . AVS Declined  Performed by:  Christl Fessenden E, RN

## 2024-11-27 ENCOUNTER — Other Ambulatory Visit (HOSPITAL_BASED_OUTPATIENT_CLINIC_OR_DEPARTMENT_OTHER): Payer: Self-pay

## 2024-11-27 MED ORDER — TRETINOIN 0.025 % EX CREA
TOPICAL_CREAM | CUTANEOUS | 3 refills | Status: AC
  Filled 2024-11-27: qty 45, 30d supply, fill #0

## 2024-11-28 ENCOUNTER — Other Ambulatory Visit (HOSPITAL_BASED_OUTPATIENT_CLINIC_OR_DEPARTMENT_OTHER): Payer: Self-pay

## 2024-12-01 ENCOUNTER — Other Ambulatory Visit (HOSPITAL_BASED_OUTPATIENT_CLINIC_OR_DEPARTMENT_OTHER): Payer: Self-pay

## 2024-12-03 ENCOUNTER — Other Ambulatory Visit (HOSPITAL_BASED_OUTPATIENT_CLINIC_OR_DEPARTMENT_OTHER): Payer: Self-pay

## 2024-12-05 ENCOUNTER — Other Ambulatory Visit (HOSPITAL_BASED_OUTPATIENT_CLINIC_OR_DEPARTMENT_OTHER): Payer: Self-pay

## 2024-12-09 ENCOUNTER — Other Ambulatory Visit (HOSPITAL_BASED_OUTPATIENT_CLINIC_OR_DEPARTMENT_OTHER): Payer: Self-pay

## 2024-12-11 ENCOUNTER — Telehealth: Payer: Self-pay

## 2024-12-11 NOTE — Telephone Encounter (Signed)
 Auth Submission: NO AUTH NEEDED Site of care: Site of care: [facility] Payer: [employer] Medication & CPT/J Code(s) submitted: Entyvio  (Vedolizumab ) J3380 Diagnosis Code:  Route of submission (phone, fax, portal): portal Phone # Fax # Auth type: Buy/Bill PB Units/visits requested: 300mg  x 6 doses Reference number:  Approval from: 12/11/24 to 12/10/25

## 2024-12-15 ENCOUNTER — Other Ambulatory Visit (HOSPITAL_BASED_OUTPATIENT_CLINIC_OR_DEPARTMENT_OTHER): Payer: Self-pay

## 2024-12-17 ENCOUNTER — Encounter: Payer: Self-pay | Admitting: Internal Medicine

## 2024-12-17 ENCOUNTER — Other Ambulatory Visit (HOSPITAL_BASED_OUTPATIENT_CLINIC_OR_DEPARTMENT_OTHER): Payer: Self-pay

## 2024-12-17 ENCOUNTER — Encounter (HOSPITAL_BASED_OUTPATIENT_CLINIC_OR_DEPARTMENT_OTHER): Payer: Self-pay

## 2024-12-18 ENCOUNTER — Encounter: Payer: Self-pay | Admitting: Family Medicine

## 2024-12-18 ENCOUNTER — Other Ambulatory Visit (HOSPITAL_BASED_OUTPATIENT_CLINIC_OR_DEPARTMENT_OTHER): Payer: Self-pay

## 2024-12-22 ENCOUNTER — Other Ambulatory Visit (HOSPITAL_BASED_OUTPATIENT_CLINIC_OR_DEPARTMENT_OTHER): Payer: Self-pay

## 2024-12-24 ENCOUNTER — Other Ambulatory Visit (HOSPITAL_BASED_OUTPATIENT_CLINIC_OR_DEPARTMENT_OTHER): Payer: Self-pay

## 2024-12-24 ENCOUNTER — Telehealth (HOSPITAL_BASED_OUTPATIENT_CLINIC_OR_DEPARTMENT_OTHER): Payer: Self-pay

## 2024-12-24 ENCOUNTER — Other Ambulatory Visit (HOSPITAL_COMMUNITY): Payer: Self-pay

## 2024-12-24 NOTE — Telephone Encounter (Signed)
 Pharmacy Patient Advocate Encounter   Received notification from Pt Calls Messages that prior authorization for Repatha  SureClick 140MG /ML auto-injectors  is required/requested.   Insurance verification completed.   The patient is insured through [facility] ADVANTAGE/RX ADVANCE.   Per test claim: PA required; PA submitted to above mentioned insurance via Latent Key/confirmation #/EOC [facility] Status is pending

## 2024-12-24 NOTE — Telephone Encounter (Signed)
 Pharmacy Patient Advocate Encounter  Received notification from [facility] ADVANTAGE/RX ADVANCE that Prior Authorization for Repatha  SureClick 140MG /ML auto-injectors  has been APPROVED from 12/24/24 to 12/24/25. Ran test claim, Copay is $112.07. This test claim was processed through [facility]- copay amounts may vary at other pharmacies due to pharmacy/plan contracts, or as the patient moves through the different stages of their insurance plan.   PA #/Case ID/Reference #: [id number]

## 2024-12-25 ENCOUNTER — Other Ambulatory Visit (HOSPITAL_BASED_OUTPATIENT_CLINIC_OR_DEPARTMENT_OTHER): Payer: Self-pay

## 2024-12-25 ENCOUNTER — Other Ambulatory Visit: Payer: Self-pay | Admitting: Medical Genetics

## 2024-12-26 ENCOUNTER — Other Ambulatory Visit (HOSPITAL_COMMUNITY): Payer: Self-pay

## 2025-01-01 ENCOUNTER — Encounter: Payer: Self-pay | Admitting: Family Medicine

## 2025-01-01 ENCOUNTER — Other Ambulatory Visit (HOSPITAL_BASED_OUTPATIENT_CLINIC_OR_DEPARTMENT_OTHER): Payer: Self-pay

## 2025-01-01 ENCOUNTER — Ambulatory Visit: Admitting: Family Medicine

## 2025-01-01 VITALS — BP 124/72 | HR 82 | Temp 97.7°F | Ht 72.0 in | Wt 166.8 lb

## 2025-01-01 DIAGNOSIS — M19041 Primary osteoarthritis, right hand: Secondary | ICD-10-CM | POA: Diagnosis not present

## 2025-01-01 DIAGNOSIS — Z125 Encounter for screening for malignant neoplasm of prostate: Secondary | ICD-10-CM

## 2025-01-01 DIAGNOSIS — Z794 Long term (current) use of insulin: Secondary | ICD-10-CM

## 2025-01-01 DIAGNOSIS — Z0000 Encounter for general adult medical examination without abnormal findings: Secondary | ICD-10-CM | POA: Diagnosis not present

## 2025-01-01 DIAGNOSIS — E782 Mixed hyperlipidemia: Secondary | ICD-10-CM

## 2025-01-01 DIAGNOSIS — E118 Type 2 diabetes mellitus with unspecified complications: Secondary | ICD-10-CM

## 2025-01-01 DIAGNOSIS — M19042 Primary osteoarthritis, left hand: Secondary | ICD-10-CM | POA: Diagnosis not present

## 2025-01-01 DIAGNOSIS — R972 Elevated prostate specific antigen [PSA]: Secondary | ICD-10-CM

## 2025-01-01 DIAGNOSIS — I10 Essential (primary) hypertension: Secondary | ICD-10-CM | POA: Diagnosis not present

## 2025-01-01 LAB — COMPREHENSIVE METABOLIC PANEL WITH GFR
ALT: 52 U/L (ref 3–53)
AST: 37 U/L (ref 5–37)
Albumin: 4.6 g/dL (ref 3.5–5.2)
Alkaline Phosphatase: 60 U/L (ref 39–117)
BUN: 22 mg/dL (ref 6–23)
CO2: 33 meq/L — ABNORMAL HIGH (ref 19–32)
Calcium: 9.4 mg/dL (ref 8.4–10.5)
Chloride: 100 meq/L (ref 96–112)
Creatinine, Ser: 1.13 mg/dL (ref 0.40–1.50)
GFR: 68.05 mL/min
Glucose, Bld: 201 mg/dL — ABNORMAL HIGH (ref 70–99)
Potassium: 4.3 meq/L (ref 3.5–5.1)
Sodium: 140 meq/L (ref 135–145)
Total Bilirubin: 0.6 mg/dL (ref 0.2–1.2)
Total Protein: 7.1 g/dL (ref 6.0–8.3)

## 2025-01-01 LAB — PSA: PSA: 1.61 ng/mL (ref 0.10–4.00)

## 2025-01-01 LAB — CBC
HCT: 48.5 % (ref 39.0–52.0)
Hemoglobin: 16.5 g/dL (ref 13.0–17.0)
MCHC: 34 g/dL (ref 30.0–36.0)
MCV: 89.9 fl (ref 78.0–100.0)
Platelets: 316 K/uL (ref 150.0–400.0)
RBC: 5.39 Mil/uL (ref 4.22–5.81)
RDW: 14.1 % (ref 11.5–15.5)
WBC: 6.9 K/uL (ref 4.0–10.5)

## 2025-01-01 LAB — HEMOGLOBIN A1C: Hgb A1c MFr Bld: 7.9 % — ABNORMAL HIGH (ref 4.6–6.5)

## 2025-01-01 MED ORDER — CELECOXIB 200 MG PO CAPS
200.0000 mg | ORAL_CAPSULE | Freq: Two times a day (BID) | ORAL | 3 refills | Status: AC
  Filled 2025-01-01: qty 180, 90d supply, fill #0

## 2025-01-01 NOTE — Addendum Note (Signed)
 Addended by: TRUDY CURVIN RAMAN on: 01/01/2025 11:28 AM   Modules accepted: Orders

## 2025-01-01 NOTE — Progress Notes (Signed)
 [facility] [address], [address] [place], [place] [address] [phone]  Date:  01/01/2025   Name:  Thomas Peterson   DOB:  [birth year]   MRN:  [id number]  PCP:  Watt Harlene BROCKS, MD    Chief Complaint: Annual Exam (Refill Celebrex  90 day supply )   History of Present Illness:  Thomas Peterson is a 64 y.o. 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  History of Present Illness Thomas Peterson is a 64 year old male who presents for follow-up regarding weight management and arthritis symptoms.  He has experienced significant weight loss of approximately 25 pounds over the past year, with his weight decreasing from 187 pounds to around 160 pounds. He is currently taking Rybelsus  7 mg, although he occasionally skips doses, and is also on Farxiga . He is actively trying to maintain his weight by consuming protein supplements and engaging in regular gym workouts, focusing on weightlifting.  He has worsening arthritis symptoms in both hands, which have been present for several years. Initially, the left hand was more affected, but now both hands are equally involved. He is right-handed and has been using Celebrex  200  mg twice daily, which he finds somewhat helpful. He has also tried Voltaren  gel without relief. He previously consulted with a hand specialist, Dr. Sebastian, who performed wrist surgery about four years ago. Steroid injections were attempted but were ineffective.  No chest pain or difficulty breathing during physical activity. He is actively engaged in physical exercise, including weightlifting and walking, and reports feeling more fit despite not gaining weight. He has been focusing on maintaining his physical activity levels and overall fitness.  He is planning a trip to [place] in May and recently traveled to [place].     Patient Active Problem List   Diagnosis Date Noted   Long-term use of immunosuppressant medication- Entyvio  09/17/2018   Low back pain 06/04/2018   Controlled type 2 diabetes mellitus with complication, with long-term current use of insulin  (HCC) 01/29/2017   Essential hypertension 01/29/2017   Crohn's ileocolitis (HCC) 01/29/2017   Mixed hyperlipidemia 01/29/2017   Leg cramps 01/29/2017   History of skin cancer 01/29/2017    Past Medical History:  Diagnosis Date   Arthritis 2020   left hand   Crohn's disease without complication (HCC) 01/29/2017   Diabetes mellitus type 2 with complications (HCC)    Diabetes mellitus without complication (HCC)    Dyslipidemia    Glaucoma    bilateral   History of basal cell carcinoma    History of melanoma    History of mood disorder  Hyperlipidemia    Hypertension    Lower back pain    Melanoma (HCC)     Past Surgical History:  Procedure Laterality Date   CATARACT EXTRACTION, BILATERAL     COLONOSCOPY     GLAUCOMA REPAIR Bilateral    KNEE ARTHROSCOPY Right 1999   MELANOMA EXCISION  1999   and squamous and basal cell excisions, and 2025   RETINAL LASER PROCEDURE Right    TONSILLECTOMY  1965    Social History[1]  Family History  Problem Relation Age of Onset   Breast cancer Mother    Heart attack Mother     Eczema Brother    Cancer Brother        type unknown, mets   Diabetes Brother    Rheum arthritis Paternal Aunt    Crohn's disease Paternal Uncle    Food Allergy  Daughter    Stomach cancer Neg Hx    Colon cancer Neg Hx    Esophageal cancer Neg Hx    Asthma Neg Hx    Immunodeficiency Neg Hx    Angioedema Neg Hx    Atopy Neg Hx     Allergies[2]  Medication list has been reviewed and updated.  Medications Ordered Prior to Encounter[3]  Review of Systems:  As per HPI- otherwise negative.   Physical Examination: Vitals:   01/01/25 1050  BP: 124/72  Pulse: 82  Temp: 97.7 F (36.5 C)  SpO2: 99%   Vitals:   01/01/25 1050  Weight: 166 lb 12.8 oz (75.7 kg)  Height: 6' (1.829 m)   Body mass index is 22.62 kg/m. Ideal Body Weight: Weight in (lb) to have BMI = 25: 183.9  GEN: no acute distress.  Normal weight, looks well  HEENT: Atraumatic, Normocephalic.  Bilateral TM wnl, oropharynx normal.  PEERL,EOMI.   Ears and Nose: No external deformity. CV: RRR, No M/G/R. No JVD. No thrill. No extra heart sounds. PULM: CTA B, no wheezes, crackles, rhonchi. No retractions. No resp. distress. No accessory muscle use. ABD: S, NT, ND, +BS. No rebound. No HSM. EXTR: No c/c/e PSYCH: Normally interactive. Conversant.    Assessment and Plan: Physical exam  Essential hypertension - Plan: CBC, Comprehensive metabolic panel with GFR  Mixed hyperlipidemia  Controlled type 2 diabetes mellitus with complication, with long-term current use of insulin  (HCC) - Plan: Hemoglobin A1c  Special screening, prostate cancer - Plan: PSA  Osteoarthritis of both hands, unspecified osteoarthritis type - Plan: celecoxib  (CELEBREX ) 200 MG capsule  Assessment & Plan Type 2 diabetes mellitus Well-controlled with GLP-1 agonist and Farxiga . Significant weight loss likely due to GLP-1 agonist and cessation of insulin . Stable blood sugar levels and A1c. - Try splitting Rybelsus  7 mg tablets to reduce  dosage, or take every other day - Monitor blood sugar levels and A1c. - Continue Farxiga .  Primary osteoarthritis of both hands Chronic osteoarthritis with Celebrex  providing some relief. Previous treatments include surgery and steroid injections with limited success. Considering radiation therapy, but cost is a concern. Infrared therapy provides some relief. - Refilled Celebrex  200 mg twice daily as needed. - Consider radiation therapy if pain becomes intolerable and cost is manageable. - Consider retrying steroid injections if pain persists. - Continue using infrared therapy for relief.  Mixed hyperlipidemia Managed with Repatha . Lipid levels are well-controlled. - Continue Repatha . - Ordered lipid panel to monitor levels.  Weight loss Significant weight loss of approximately 25-30 pounds over the past year, attributed to GLP-1 agonist and cessation of insulin . Current weight is  healthier but of course we always worry about losing weight very easily.  Offered to do a CT chest/ abd/pelvis but for now he does not wish to pursue this  we will monitor carefully  - Monitor weight and nutritional status. - Encouraged protein intake and regular exercise.  General health maintenance Routine health maintenance discussed, including blood work and screenings. - Ordered CBC, metabolic panel, A1c, and PSA. - Ordered lipid panel.  Signed Harlene Schroeder, MD     [1]  Social History Tobacco Use   Smoking status: Former    Current packs/day: 0.00    Types: Cigarettes    Start date: 36    Quit date: 1995    Years since quitting: 31.0    Passive exposure: Never   Smokeless tobacco: Never   Tobacco comments:    Off and on smoker   Vaping Use   Vaping status: Never Used  Substance Use Topics   Alcohol use: Yes    Comment: occasional-1-2 per week   Drug use: Never  [2]  Allergies Allergen Reactions   Sulfa Antibiotics Anaphylaxis    I got these purple spots everywhere     Ciprofloxacin Other (See Comments)    GI upset   Flagyl [Metronidazole] Other (See Comments)    GI effects    Mesalamine  Other (See Comments)    Gastrointestinal distress   Metformin  And Related Other (See Comments)    Worsens his crohn disease sx, diarrhea    Statins Other (See Comments)    Leg cramps  [3]  Current Outpatient Medications on File Prior to Visit  Medication Sig Dispense Refill   amLODipine  (NORVASC ) 10 MG tablet Take 1 tablet (10 mg total) by mouth daily. 90 tablet 0   Boswellia Serrata (BOSWELLIA PO) Take 1 tablet by mouth 2 (two) times daily.     budesonide  (ENTOCORT EC ) 3 MG 24 hr capsule Take 3 capsules (9 mg total) by mouth daily. 90 each 11   Cholecalciferol (VITAMIN D3 PO) Take by mouth. 2500 iu daily     dapagliflozin  propanediol (FARXIGA ) 10 MG TABS tablet Take 1 tablet (10 mg total) by mouth daily. 90 tablet 3   Evolocumab  (REPATHA  SURECLICK) 140 MG/ML SOAJ Inject 140 mg into the skin every 14 (fourteen) days. 2 mL 11   Insulin  Pen Needle (TECHLITE PEN NEEDLES) 32G X 6 MM MISC Use once daily as directed. 100 each 1   ipratropium (ATROVENT ) 0.03 % nasal spray Place 2 sprays into both nostrils 3 (three) times daily. 30 mL 1   lidocaine  (XYLOCAINE ) 2 % solution Use as directed 15 mLs in the mouth or throat as needed for mouth pain. 200 mL 0   Multiple Vitamin (MULTIVITAMIN) tablet Take 1 tablet by mouth daily.     Omega-3 Fatty Acids (FISH OIL PO) Take 4 g by mouth daily.     omeprazole  (PRILOSEC) 40 MG capsule Take 1 capsule (40 mg total) by mouth daily. 90 capsule 3   oseltamivir  (TAMIFLU ) 75 MG capsule Take 1 capsule (75 mg total) by mouth 2 (two) times daily. 14 capsule 0   Probiotic Product (VSL#3 PO) Take by mouth as needed.     Semaglutide  (RYBELSUS ) 7 MG TABS Take 1 tablet (7 mg total) by mouth daily. 90 tablet 3   sodium chloride  0.9 % SOLN 250 mL with vedolizumab  300 MG SOLR 300 mg Inject 300 mg into the vein every 8 (eight) weeks.     tretinoin  (RETIN-A )  0.025 % cream Apply  a small amount to skin every night Start 2-3x per week and increase use as tolerated. 45 g 3   valsartan -hydrochlorothiazide  (DIOVAN -HCT) 320-25 MG tablet Take 1 tablet by mouth daily. 90 tablet 3   venlafaxine  XR (EFFEXOR -XR) 37.5 MG 24 hr capsule Take 1 capsule (37.5 mg total) by mouth daily with breakfast. 90 capsule 3   No current facility-administered medications on file prior to visit.   "

## 2025-01-01 NOTE — Patient Instructions (Addendum)
 It was good to see you today- let's try taking a 1/2 of the Rybelsus  7 or taking every other day- let me know!

## 2025-01-02 ENCOUNTER — Ambulatory Visit: Payer: Self-pay | Admitting: Family Medicine

## 2025-01-02 DIAGNOSIS — Z20828 Contact with and (suspected) exposure to other viral communicable diseases: Secondary | ICD-10-CM

## 2025-01-02 LAB — LIPID PANEL
Cholesterol: 136 mg/dL
HDL: 49 mg/dL
LDL Cholesterol (Calc): 65 mg/dL
Non-HDL Cholesterol (Calc): 87 mg/dL
Total CHOL/HDL Ratio: 2.8 (calc)
Triglycerides: 138 mg/dL

## 2025-01-06 ENCOUNTER — Encounter (HOSPITAL_BASED_OUTPATIENT_CLINIC_OR_DEPARTMENT_OTHER): Payer: Self-pay

## 2025-01-06 ENCOUNTER — Other Ambulatory Visit: Payer: Self-pay

## 2025-01-06 ENCOUNTER — Other Ambulatory Visit (HOSPITAL_BASED_OUTPATIENT_CLINIC_OR_DEPARTMENT_OTHER): Payer: Self-pay

## 2025-01-07 ENCOUNTER — Encounter (HOSPITAL_BASED_OUTPATIENT_CLINIC_OR_DEPARTMENT_OTHER): Payer: Self-pay

## 2025-01-07 ENCOUNTER — Other Ambulatory Visit (HOSPITAL_BASED_OUTPATIENT_CLINIC_OR_DEPARTMENT_OTHER): Payer: Self-pay

## 2025-01-09 LAB — GENECONNECT MOLECULAR SCREEN: Genetic Analysis Overall Interpretation: NEGATIVE

## 2025-01-15 ENCOUNTER — Other Ambulatory Visit

## 2025-01-15 DIAGNOSIS — Z20828 Contact with and (suspected) exposure to other viral communicable diseases: Secondary | ICD-10-CM

## 2025-01-15 NOTE — Addendum Note (Signed)
 Addended by: WATT RAISIN C on: 01/15/2025 01:04 PM   Modules accepted: Orders

## 2025-01-16 LAB — MEASLES/MUMPS/RUBELLA IMMUNITY
Mumps IgG: 87.4 [AU]/ml
Rubella: 12.5 {index}
Rubeola IgG: <13.5 [AU]/ml — ABNORMAL LOW

## 2025-01-21 ENCOUNTER — Other Ambulatory Visit: Payer: Self-pay

## 2025-01-21 ENCOUNTER — Ambulatory Visit

## 2025-01-22 ENCOUNTER — Ambulatory Visit
